# Patient Record
Sex: Female | Born: 1990 | Race: Black or African American | Hispanic: No | Marital: Single | Smoking: Former smoker
Health system: Southern US, Community
[De-identification: ages and names within clinical notes are randomized; demographics above are authoritative.]

## PROBLEM LIST (undated history)

## (undated) DIAGNOSIS — K8020 Calculus of gallbladder without cholecystitis without obstruction: Secondary | ICD-10-CM

## (undated) DIAGNOSIS — F419 Anxiety disorder, unspecified: Secondary | ICD-10-CM

## (undated) DIAGNOSIS — E282 Polycystic ovarian syndrome: Secondary | ICD-10-CM

## (undated) DIAGNOSIS — R102 Pelvic and perineal pain unspecified side: Secondary | ICD-10-CM

## (undated) DIAGNOSIS — N946 Dysmenorrhea, unspecified: Secondary | ICD-10-CM

## (undated) DIAGNOSIS — G8929 Other chronic pain: Secondary | ICD-10-CM

## (undated) DIAGNOSIS — B019 Varicella without complication: Secondary | ICD-10-CM

## (undated) DIAGNOSIS — R109 Unspecified abdominal pain: Secondary | ICD-10-CM

## (undated) HISTORY — PX: WISDOM TOOTH EXTRACTION: SHX21

## (undated) HISTORY — DX: Polycystic ovarian syndrome: E28.2

## (undated) HISTORY — DX: Varicella without complication: B01.9

## (undated) HISTORY — DX: Anxiety disorder, unspecified: F41.9

---
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
## 2012-07-16 ENCOUNTER — Encounter: Payer: Self-pay | Admitting: *Deleted

## 2012-07-23 ENCOUNTER — Encounter: Payer: Self-pay | Admitting: *Deleted

## 2012-07-24 ENCOUNTER — Encounter: Payer: Self-pay | Admitting: *Deleted

## 2012-07-24 ENCOUNTER — Encounter: Payer: Self-pay | Admitting: Obstetrics and Gynecology

## 2012-07-30 ENCOUNTER — Encounter (HOSPITAL_COMMUNITY): Payer: Self-pay | Admitting: *Deleted

## 2012-07-30 ENCOUNTER — Emergency Department (HOSPITAL_COMMUNITY)
Admission: EM | Admit: 2012-07-30 | Discharge: 2012-07-30 | Disposition: A | Discharge status: Home / Self Care | Payer: Medicaid Other | Attending: Emergency Medicine | Admitting: Emergency Medicine

## 2012-07-30 ENCOUNTER — Other Ambulatory Visit: Payer: Self-pay

## 2012-07-30 ENCOUNTER — Ambulatory Visit (HOSPITAL_COMMUNITY)
Admission: RE | Admit: 2012-07-30 | Discharge: 2012-07-30 | Disposition: A | Discharge status: Home / Self Care | Payer: Self-pay | Source: Ambulatory Visit | Attending: Emergency Medicine | Admitting: Emergency Medicine

## 2012-07-30 DIAGNOSIS — Z8619 Personal history of other infectious and parasitic diseases: Secondary | ICD-10-CM | POA: Insufficient documentation

## 2012-07-30 DIAGNOSIS — Z8742 Personal history of other diseases of the female genital tract: Secondary | ICD-10-CM | POA: Insufficient documentation

## 2012-07-30 DIAGNOSIS — R0789 Other chest pain: Secondary | ICD-10-CM | POA: Insufficient documentation

## 2012-07-30 DIAGNOSIS — R079 Chest pain, unspecified: Secondary | ICD-10-CM | POA: Insufficient documentation

## 2012-07-30 DIAGNOSIS — F17200 Nicotine dependence, unspecified, uncomplicated: Secondary | ICD-10-CM | POA: Insufficient documentation

## 2012-07-30 LAB — BASIC METABOLIC PANEL
Calcium: 9.5 mg/dL (ref 8.4–10.5)
Chloride: 102 mEq/L (ref 96–112)
Creatinine, Ser: 0.94 mg/dL (ref 0.50–1.10)
GFR calc Af Amer: >90 mL/min (ref >90)
Sodium: 138 mEq/L (ref 135–145)

## 2012-07-30 LAB — CBC WITH DIFFERENTIAL/PLATELET
Basophils Absolute: 0 10*3/uL (ref 0.0–0.1)
Basophils Relative: 0 % (ref 0–1)
Eosinophils Relative: 1 % (ref 0–5)
HCT: 36.1 % (ref 36.0–46.0)
Lymphocytes Relative: 14 % (ref 12–46)
MCHC: 34.1 g/dL (ref 30.0–36.0)
Monocytes Absolute: 0.9 10*3/uL (ref 0.1–1.0)
Neutro Abs: 13.7 10*3/uL — ABNORMAL HIGH (ref 1.7–7.7)
Platelets: 258 10*3/uL (ref 150–400)
RDW: 13 % (ref 11.5–15.5)
WBC: 17.1 10*3/uL — ABNORMAL HIGH (ref 4.0–10.5)

## 2012-07-30 LAB — TROPONIN I: Troponin I: <0.3 ng/mL (ref <0.30)

## 2012-07-30 NOTE — ED Notes (Signed)
Pt states she woke up having sharp centralized chest pain. Pt states that since she's been at the hospital, she feels better.

## 2012-12-24 ENCOUNTER — Encounter: Payer: Self-pay | Admitting: Obstetrics and Gynecology

## 2012-12-26 ENCOUNTER — Other Ambulatory Visit (HOSPITAL_COMMUNITY)
Admission: RE | Admit: 2012-12-26 | Discharge: 2012-12-26 | Disposition: A | Discharge status: Home / Self Care | Payer: Medicaid Other | Source: Ambulatory Visit | Attending: Obstetrics and Gynecology | Admitting: Obstetrics and Gynecology

## 2012-12-26 ENCOUNTER — Ambulatory Visit (INDEPENDENT_AMBULATORY_CARE_PROVIDER_SITE_OTHER): Payer: Medicaid Other | Admitting: Obstetrics and Gynecology

## 2012-12-26 ENCOUNTER — Encounter (INDEPENDENT_AMBULATORY_CARE_PROVIDER_SITE_OTHER): Payer: Self-pay

## 2012-12-26 ENCOUNTER — Encounter: Payer: Self-pay | Admitting: Obstetrics and Gynecology

## 2012-12-26 VITALS — BP 130/80 | Ht 69.0 in | Wt 321.0 lb

## 2012-12-26 DIAGNOSIS — Z01419 Encounter for gynecological examination (general) (routine) without abnormal findings: Secondary | ICD-10-CM | POA: Insufficient documentation

## 2012-12-26 DIAGNOSIS — Z3049 Encounter for surveillance of other contraceptives: Secondary | ICD-10-CM

## 2012-12-26 DIAGNOSIS — Z3200 Encounter for pregnancy test, result unknown: Secondary | ICD-10-CM

## 2012-12-26 DIAGNOSIS — E282 Polycystic ovarian syndrome: Secondary | ICD-10-CM

## 2012-12-26 DIAGNOSIS — N938 Other specified abnormal uterine and vaginal bleeding: Secondary | ICD-10-CM

## 2012-12-26 DIAGNOSIS — Z113 Encounter for screening for infections with a predominantly sexual mode of transmission: Secondary | ICD-10-CM | POA: Insufficient documentation

## 2012-12-26 DIAGNOSIS — Z1151 Encounter for screening for human papillomavirus (HPV): Secondary | ICD-10-CM | POA: Insufficient documentation

## 2012-12-26 LAB — POCT URINE PREGNANCY: Preg Test, Ur: NEGATIVE

## 2012-12-26 MED ORDER — NORGESTIMATE-ETH ESTRADIOL 0.25-35 MG-MCG PO TABS: 1.0000 | ORAL_TABLET | Freq: Every day | ORAL | Status: DC

## 2012-12-26 MED ORDER — MEDROXYPROGESTERONE ACETATE 10 MG PO TABS: 10.0000 mg | ORAL_TABLET | Freq: Every day | ORAL | Status: DC

## 2012-12-26 NOTE — Progress Notes (Signed)
Patient ID: Katie Roberson, female   DOB: Dec 20, 1990, 22 y.o.   MRN: [id number] Pt here today for annual pap and physical. Pt requested a UPT.   Assessment:  Annual Gyn Exam today for PAP and physical. Prior exams, first PAPs    Plan:  1. pap smear done, next pap due 1 year 2. return annually or prn 3    2 mo f/u 4   Start Provera now x 10d, begin Birth Control in 2 weeks, f/u in 2 months Subjective:  Katie Roberson is a 22 y.o. female No obstetric history on file. who presents for annual exam. No LMP recorded. The patient has complaints today of persistent vaginal bleeding for the past few months. Has prior diagnosis of PCOS at [facility] 2 years prior. Has been following up with PCP for the symptoms. No prior blood work done. No birth control currently. Currently on Celexa for past month, prescribed by therapist. Denies any prior sexual abuse. Denies sexual activity currently. Prior sexual activity in the past.    Unknown family h/o POCS or other GYN abnormalities.   The following portions of the patient's history were reviewed and updated as appropriate: allergies, current medications, past family history, past medical history, past social history, past surgical history and problem list.  Review of Systems Constitutional: negative Gastrointestinal: positive for abdominal pain, intermittent suprapubic pains Genitourinary: abnormal vaginal bleeding  Objective:  Ht 5\' 9"  (1.753 m)  Wt 321 lb (145.605 kg)  BMI 47.38 kg/m2   BMI: Body mass index is 47.38 kg/(m^2).  General Appearance: Alert, appropriate appearance for age. No acute distress HEENT: Grossly normal Neck / Thyroid:  Cardiovascular: RRR; normal S1, S2, no murmur Lungs: CTA bilaterally Back: No CVAT Breast Exam: No dimpling, nipple retraction or discharge. No masses or nodes., Normal to inspection and Normal breast tissue bilaterally Gastrointestinal: Soft, non-tender, no masses or  organomegaly Pelvic Exam: External genitalia: normal general appearance Vaginal: presence of blood Cervix: normal appearance and clear mucus and lite blood Adnexa: normal bimanual exam Uterus: normal single, nontender Clinical staff offered to be present for exam: yes  Initials: Ashley  Lymphatic Exam: Non-palpable nodes in neck, clavicular, axillary, or inguinal regions Skin: no rash or abnormalities Neurologic: Normal gait and speech, no tremor  Psychiatric: Alert and oriented, appropriate affect.  Urinalysis:Not done Urine preg test negative   Christin Bach. MD Pgr [phone] 10:52 AM

## 2012-12-26 NOTE — Patient Instructions (Signed)
Take provera daily x 10 days, expect heavy bleeding upon completion of provera  On the following Sunday, begin the sprintec Birth control pills   Keep a calendar with all days of bleeding on the calendar, so we can review at followup  Pap results by letter in 1 month.

## 2013-01-05 ENCOUNTER — Telehealth: Payer: Self-pay | Admitting: Obstetrics and Gynecology

## 2013-01-05 NOTE — Telephone Encounter (Addendum)
Pt informed of abnormal pap, ASCUS, repeat in 1 year, GC/CHL and HPV not detected.

## 2013-01-09 ENCOUNTER — Encounter: Payer: Self-pay | Admitting: Obstetrics and Gynecology

## 2013-01-20 ENCOUNTER — Telehealth: Payer: Self-pay | Admitting: *Deleted

## 2013-01-20 NOTE — Telephone Encounter (Signed)
Pt informed abnormal pap (ASCUS), pamphlet mailed per pt request. Pt to repeat pap in 1 year.

## 2013-01-28 ENCOUNTER — Telehealth: Payer: Self-pay | Admitting: Obstetrics and Gynecology

## 2013-01-28 NOTE — Telephone Encounter (Signed)
Pt very concerned about her bleeding, Pt states that she has had heavy bleeding for about 8 months. Pt was given provera, is having heavy bleeding right now. Pt saw Dr. Emelda Fear. Pt is unsure of why she was given Provera. Pt has never stopped bleeding. Pt was advised to make an appointment with a provider to discuss heavy bleeding.

## 2013-01-29 ENCOUNTER — Encounter: Payer: Self-pay | Admitting: Obstetrics & Gynecology

## 2013-01-29 ENCOUNTER — Ambulatory Visit (INDEPENDENT_AMBULATORY_CARE_PROVIDER_SITE_OTHER): Payer: Medicaid Other | Admitting: Obstetrics & Gynecology

## 2013-01-29 ENCOUNTER — Encounter (INDEPENDENT_AMBULATORY_CARE_PROVIDER_SITE_OTHER): Payer: Self-pay

## 2013-01-29 VITALS — BP 140/80 | Ht 69.0 in | Wt 318.0 lb

## 2013-01-29 DIAGNOSIS — N939 Abnormal uterine and vaginal bleeding, unspecified: Secondary | ICD-10-CM

## 2013-01-29 DIAGNOSIS — Z3049 Encounter for surveillance of other contraceptives: Secondary | ICD-10-CM

## 2013-01-29 HISTORY — DX: Abnormal uterine and vaginal bleeding, unspecified: N93.9

## 2013-01-29 MED ORDER — MEGESTROL ACETATE 40 MG PO TABS: ORAL_TABLET | ORAL | Status: DC

## 2013-01-29 NOTE — Progress Notes (Signed)
Patient ID: Katie Roberson, female   DOB: 03/23/1990, 22 y.o.   MRN: [id number] Pt has long history, essentially since menarche, with irregular heavy and prolonged bleeding Has been bleeding almost continuously for almost 8 months  Will use megestrol algorithm  Follow up in 6 months

## 2013-01-30 ENCOUNTER — Ambulatory Visit: Payer: Medicaid Other | Admitting: Adult Health

## 2013-02-18 ENCOUNTER — Telehealth: Payer: Self-pay

## 2013-03-02 NOTE — Telephone Encounter (Signed)
Pt states questions answered.

## 2013-03-06 ENCOUNTER — Ambulatory Visit: Payer: Medicaid Other | Admitting: Obstetrics and Gynecology

## 2013-03-12 ENCOUNTER — Ambulatory Visit: Payer: Medicaid Other | Admitting: Obstetrics & Gynecology

## 2013-03-12 ENCOUNTER — Telehealth: Payer: Self-pay | Admitting: Obstetrics & Gynecology

## 2013-03-12 NOTE — Telephone Encounter (Signed)
Pt aware of Hgb from December. Pt was advised to start OTC iron supplement. Pt verbalized understanding.

## 2013-03-26 ENCOUNTER — Ambulatory Visit: Payer: Medicaid Other | Admitting: Obstetrics & Gynecology

## 2013-04-28 ENCOUNTER — Telehealth: Payer: Self-pay | Admitting: Obstetrics & Gynecology

## 2013-04-28 NOTE — Telephone Encounter (Signed)
Pt requesting pap results from 12/26/12. Pt informed of abnormal pap (ASCUS) will f/u 1 year pap per Dr. Emelda FearFerguson, pt also informed of WNL TSH.

## 2013-06-29 ENCOUNTER — Telehealth: Payer: Self-pay | Admitting: Obstetrics and Gynecology

## 2013-06-29 NOTE — Telephone Encounter (Signed)
Pt states that she has been looking online about baby powder and that it said that the talcum or corn starch could cause ovarian cancer. I spoke with JAG and she advised that she had not heard that those would cause ovarian cancer, but that she usually does not advise using baby powder down there because it clumps.

## 2013-07-28 ENCOUNTER — Encounter: Payer: Self-pay | Admitting: *Deleted

## 2013-08-11 ENCOUNTER — Encounter (HOSPITAL_COMMUNITY): Payer: Self-pay | Admitting: Emergency Medicine

## 2013-08-11 ENCOUNTER — Emergency Department (HOSPITAL_COMMUNITY)
Admission: EM | Admit: 2013-08-11 | Discharge: 2013-08-11 | Disposition: A | Discharge status: Home / Self Care | Payer: Medicaid Other | Attending: Emergency Medicine | Admitting: Emergency Medicine

## 2013-08-11 DIAGNOSIS — K2970 Gastritis, unspecified, without bleeding: Secondary | ICD-10-CM | POA: Insufficient documentation

## 2013-08-11 DIAGNOSIS — K2990 Gastroduodenitis, unspecified, without bleeding: Principal | ICD-10-CM

## 2013-08-11 DIAGNOSIS — F411 Generalized anxiety disorder: Secondary | ICD-10-CM | POA: Insufficient documentation

## 2013-08-11 DIAGNOSIS — Z8619 Personal history of other infectious and parasitic diseases: Secondary | ICD-10-CM | POA: Insufficient documentation

## 2013-08-11 DIAGNOSIS — Z79899 Other long term (current) drug therapy: Secondary | ICD-10-CM | POA: Insufficient documentation

## 2013-08-11 DIAGNOSIS — Z8639 Personal history of other endocrine, nutritional and metabolic disease: Secondary | ICD-10-CM | POA: Insufficient documentation

## 2013-08-11 DIAGNOSIS — Z3202 Encounter for pregnancy test, result negative: Secondary | ICD-10-CM | POA: Insufficient documentation

## 2013-08-11 DIAGNOSIS — Z862 Personal history of diseases of the blood and blood-forming organs and certain disorders involving the immune mechanism: Secondary | ICD-10-CM | POA: Insufficient documentation

## 2013-08-11 LAB — COMPREHENSIVE METABOLIC PANEL
ALBUMIN: 3.1 g/dL — AB (ref 3.5–5.2)
ALT: 14 U/L (ref 0–35)
AST: 21 U/L (ref 0–37)
Alkaline Phosphatase: 124 U/L — ABNORMAL HIGH (ref 39–117)
BILIRUBIN TOTAL: 0.1 mg/dL — AB (ref 0.3–1.2)
BUN: 11 mg/dL (ref 6–23)
CALCIUM: 8.8 mg/dL (ref 8.4–10.5)
CHLORIDE: 102 meq/L (ref 96–112)
CO2: 26 meq/L (ref 19–32)
Creatinine, Ser: 0.94 mg/dL (ref 0.50–1.10)
GFR calc Af Amer: >90 mL/min (ref >90)
GFR, EST NON AFRICAN AMERICAN: 85 mL/min — AB (ref >90)
Glucose, Bld: 100 mg/dL — ABNORMAL HIGH (ref 70–99)
Potassium: 3.7 mEq/L (ref 3.7–5.3)
SODIUM: 138 meq/L (ref 137–147)
Total Protein: 7.3 g/dL (ref 6.0–8.3)

## 2013-08-11 LAB — CBC WITH DIFFERENTIAL/PLATELET
BASOS ABS: 0 10*3/uL (ref 0.0–0.1)
BASOS PCT: 0 % (ref 0–1)
Eosinophils Absolute: 0.1 10*3/uL (ref 0.0–0.7)
Eosinophils Relative: 1 % (ref 0–5)
HCT: 34.3 % — ABNORMAL LOW (ref 36.0–46.0)
Hemoglobin: 11.8 g/dL — ABNORMAL LOW (ref 12.0–15.0)
LYMPHS PCT: 26 % (ref 12–46)
Lymphs Abs: 2.5 10*3/uL (ref 0.7–4.0)
MCH: 28.6 pg (ref 26.0–34.0)
MCHC: 34.4 g/dL (ref 30.0–36.0)
MCV: 83.1 fL (ref 78.0–100.0)
Monocytes Absolute: 0.7 10*3/uL (ref 0.1–1.0)
Monocytes Relative: 7 % (ref 3–12)
NEUTROS ABS: 6.6 10*3/uL (ref 1.7–7.7)
NEUTROS PCT: 66 % (ref 43–77)
PLATELETS: 257 10*3/uL (ref 150–400)
RBC: 4.13 MIL/uL (ref 3.87–5.11)
RDW: 13.7 % (ref 11.5–15.5)
WBC: 9.9 10*3/uL (ref 4.0–10.5)

## 2013-08-11 LAB — URINALYSIS, ROUTINE W REFLEX MICROSCOPIC
Bilirubin Urine: NEGATIVE
Glucose, UA: NEGATIVE mg/dL
HGB URINE DIPSTICK: NEGATIVE
Ketones, ur: NEGATIVE mg/dL
LEUKOCYTES UA: NEGATIVE
NITRITE: NEGATIVE
Protein, ur: NEGATIVE mg/dL
UROBILINOGEN UA: 0.2 mg/dL (ref 0.0–1.0)
pH: 6 (ref 5.0–8.0)

## 2013-08-11 LAB — PREGNANCY, URINE: PREG TEST UR: NEGATIVE

## 2013-08-11 LAB — LIPASE, BLOOD: Lipase: 42 U/L (ref 11–59)

## 2013-08-11 MED ORDER — GI COCKTAIL ~~LOC~~
30.0000 mL | Freq: Once | ORAL | Status: AC
  Administered 2013-08-11: 30 mL via ORAL
  Filled 2013-08-11: qty 30

## 2013-08-11 MED ORDER — ONDANSETRON 4 MG PO TBDP: ORAL_TABLET | ORAL | Status: DC

## 2013-08-11 NOTE — Discharge Instructions (Signed)

## 2013-08-11 NOTE — ED Provider Notes (Signed)
CSN: [id number]     Arrival date & time 08/11/13  0240 History   First MD Initiated Contact with Patient 08/11/13 0248     Chief Complaint  Patient presents with  . Abdominal Pain     (Consider location/radiation/quality/duration/timing/severity/associated sxs/prior Treatment) HPI Patient presents with several months of episodic upper abdominal pain. She was recently seen on Friday had a ultrasound which she states showed the possibility of gallstones. She began having abdominal pain yesterday and has been constant this evening. She had one episode of vomiting. She's had chills but no fever. The pain is improved slightly. Patient denies taking NSAIDs or eating acidic/spicy food. The pain does not radiate. Does not appear to be associated with food. Past Medical History  Diagnosis Date  . PCOS (polycystic ovarian syndrome)   . Chicken pox   . Anxiety    History reviewed. No pertinent past surgical history. Family History  Problem Relation Age of Onset  . Diabetes Paternal Grandmother    History  Substance Use Topics  . Smoking status: Never Smoker   . Smokeless tobacco: Never Used  . Alcohol Use: No   OB History   Grav Para Term Preterm Abortions TAB SAB Ect Mult Living                 Review of Systems  Constitutional: Negative for fever and chills.  Respiratory: Negative for cough and shortness of breath.   Cardiovascular: Negative for chest pain.  Gastrointestinal: Positive for nausea, vomiting and abdominal pain. Negative for diarrhea and constipation.  Genitourinary: Negative for dysuria and flank pain.  Musculoskeletal: Negative for back pain, myalgias, neck pain and neck stiffness.  Skin: Negative for rash and wound.  Neurological: Negative for dizziness, weakness, light-headedness and numbness.  All other systems reviewed and are negative.     Allergies  Review of patient's allergies indicates no known allergies.  Home Medications   Prior to Admission  medications   Medication Sig Start Date End Date Taking? Authorizing Zoey Gilkeson  clonazePAM (KLONOPIN) 0.5 MG tablet Take 0.5 mg by mouth 2 (two) times daily as needed for anxiety.   Yes Historical Baudelia Schroepfer, MD  omeprazole (PRILOSEC) 20 MG capsule Take 20 mg by mouth daily.   Yes Historical Clodagh Odenthal, MD  citalopram (CELEXA) 10 MG tablet Take 10 mg by mouth daily.    Historical Khai Torbert, MD  medroxyPROGESTERone (PROVERA) 10 MG tablet Take 1 tablet (10 mg total) by mouth daily. 12/26/12   Tilda BurrowJohn Ferguson V, MD  megestrol (MEGACE) 40 MG tablet Take 3 tablets a day for 5 days, 2 tablets a day for 5 days, then 1 tablet a day 01/29/13   Lazaro ArmsLuther H Eure, MD  norgestimate-ethinyl estradiol (ORTHO-CYCLEN,SPRINTEC,PREVIFEM) 0.25-35 MG-MCG tablet Take 1 tablet by mouth daily. 12/26/12   Tilda BurrowJohn Ferguson V, MD   BP 137/91  Pulse 78  Temp(Src) 98.1 F (36.7 C) (Oral)  Resp 20  SpO2 100% Physical Exam  Nursing note and vitals reviewed. Constitutional: She is oriented to person, place, and time. She appears well-developed and well-nourished. No distress.  Patient appears very comfortable.  HENT:  Head: Normocephalic and atraumatic.  Mouth/Throat: Oropharynx is clear and moist.  Eyes: EOM are normal. Pupils are equal, round, and reactive to light.  Neck: Normal range of motion. Neck supple.  Cardiovascular: Normal rate and regular rhythm.   Pulmonary/Chest: Effort normal and breath sounds normal. No respiratory distress. She has no wheezes. She has no rales.  Abdominal: Soft. Bowel sounds are normal. She  exhibits no distension and no mass. There is tenderness (mild epigastric tenderness with palpation.). There is no rebound and no guarding.  Musculoskeletal: Normal range of motion. She exhibits no edema and no tenderness.  No CVA tenderness.  Neurological: She is alert and oriented to person, place, and time.  Skin: Skin is warm and dry. No rash noted. No erythema.  Psychiatric: She has a normal mood and affect.  Her behavior is normal.    ED Course  Procedures (including critical care time) Labs Review Labs Reviewed  URINALYSIS, ROUTINE W REFLEX MICROSCOPIC  PREGNANCY, URINE  CBC WITH DIFFERENTIAL  COMPREHENSIVE METABOLIC PANEL  LIPASE, BLOOD    Imaging Review No results found.   EKG Interpretation None      MDM   Final diagnoses:  None   Symptoms are now improved with the GI cocktail. Abdomen soft without any rebound or guarding. Symptoms likely due to gastritis. Patient is to continue on file sec and take over-the-counter Mylanta or Maalox when necessary. Have given by some modification advice and recommended followup with gastroenterology should her symptoms continue. For worsening symptoms she's been advised to return to the emergency department.     Loren Raceravid Yelverton, MD 08/11/13 [phone]

## 2013-08-11 NOTE — ED Notes (Signed)
Pt with c/o abd pain, states she had gallbladder ultrasound on Friday, states she was told she may have gallstones.

## 2013-08-17 DIAGNOSIS — Z029 Encounter for administrative examinations, unspecified: Secondary | ICD-10-CM

## 2013-08-26 ENCOUNTER — Encounter (HOSPITAL_COMMUNITY): Admission: RE | Admit: 2013-08-26 | Payer: Medicaid Other | Source: Ambulatory Visit

## 2013-08-31 ENCOUNTER — Ambulatory Visit (HOSPITAL_COMMUNITY): Admission: RE | Admit: 2013-08-31 | Payer: Medicaid Other | Source: Ambulatory Visit | Admitting: General Surgery

## 2013-08-31 ENCOUNTER — Encounter (HOSPITAL_COMMUNITY): Admission: RE | Payer: Self-pay | Source: Ambulatory Visit

## 2013-08-31 SURGERY — LAPAROSCOPIC CHOLECYSTECTOMY
Anesthesia: General

## 2013-09-02 ENCOUNTER — Ambulatory Visit: Payer: Medicaid Other | Admitting: Gastroenterology

## 2013-09-02 ENCOUNTER — Telehealth: Payer: Self-pay | Admitting: Gastroenterology

## 2013-09-02 ENCOUNTER — Encounter: Payer: Self-pay | Admitting: Gastroenterology

## 2013-09-02 NOTE — Telephone Encounter (Signed)
Mailed letter °

## 2013-09-02 NOTE — Telephone Encounter (Signed)
Pt was a no show

## 2013-10-03 ENCOUNTER — Encounter (HOSPITAL_COMMUNITY): Payer: Self-pay | Admitting: Emergency Medicine

## 2013-10-03 ENCOUNTER — Emergency Department (HOSPITAL_COMMUNITY)
Admission: EM | Admit: 2013-10-03 | Discharge: 2013-10-04 | Disposition: A | Discharge status: Home / Self Care | Payer: Medicaid Other | Attending: Emergency Medicine | Admitting: Emergency Medicine

## 2013-10-03 DIAGNOSIS — M549 Dorsalgia, unspecified: Secondary | ICD-10-CM | POA: Insufficient documentation

## 2013-10-03 DIAGNOSIS — Z8659 Personal history of other mental and behavioral disorders: Secondary | ICD-10-CM | POA: Insufficient documentation

## 2013-10-03 DIAGNOSIS — Z792 Long term (current) use of antibiotics: Secondary | ICD-10-CM | POA: Insufficient documentation

## 2013-10-03 DIAGNOSIS — Z8619 Personal history of other infectious and parasitic diseases: Secondary | ICD-10-CM | POA: Insufficient documentation

## 2013-10-03 DIAGNOSIS — Z862 Personal history of diseases of the blood and blood-forming organs and certain disorders involving the immune mechanism: Secondary | ICD-10-CM | POA: Insufficient documentation

## 2013-10-03 DIAGNOSIS — R1013 Epigastric pain: Secondary | ICD-10-CM | POA: Insufficient documentation

## 2013-10-03 DIAGNOSIS — K8050 Calculus of bile duct without cholangitis or cholecystitis without obstruction: Secondary | ICD-10-CM

## 2013-10-03 DIAGNOSIS — K5900 Constipation, unspecified: Secondary | ICD-10-CM | POA: Insufficient documentation

## 2013-10-03 DIAGNOSIS — Z3202 Encounter for pregnancy test, result negative: Secondary | ICD-10-CM | POA: Insufficient documentation

## 2013-10-03 DIAGNOSIS — K8020 Calculus of gallbladder without cholecystitis without obstruction: Secondary | ICD-10-CM | POA: Insufficient documentation

## 2013-10-03 DIAGNOSIS — Z8639 Personal history of other endocrine, nutritional and metabolic disease: Secondary | ICD-10-CM | POA: Insufficient documentation

## 2013-10-03 LAB — POC URINE PREG, ED: PREG TEST UR: NEGATIVE

## 2013-10-03 MED ORDER — HYDROMORPHONE HCL PF 1 MG/ML IJ SOLN
1.0000 mg | Freq: Once | INTRAMUSCULAR | Status: AC
  Administered 2013-10-03: 1 mg via INTRAVENOUS
  Filled 2013-10-03: qty 1

## 2013-10-03 MED ORDER — SODIUM CHLORIDE 0.9 % IV SOLN: 1000.0000 mL | INTRAVENOUS | Status: DC

## 2013-10-03 MED ORDER — SODIUM CHLORIDE 0.9 % IV SOLN
1000.0000 mL | Freq: Once | INTRAVENOUS | Status: AC
  Administered 2013-10-03: 1000 mL via INTRAVENOUS

## 2013-10-03 MED ORDER — ONDANSETRON HCL 4 MG/2ML IJ SOLN
4.0000 mg | Freq: Once | INTRAMUSCULAR | Status: AC
  Administered 2013-10-03: 4 mg via INTRAVENOUS
  Filled 2013-10-03: qty 2

## 2013-10-03 NOTE — ED Notes (Signed)
Upper abdominal pain, with n&V,  emesis last time 1hr ago.  States she had this issue before 2 years ago with gall stones.

## 2013-10-03 NOTE — ED Provider Notes (Signed)
CSN: [id number]     Arrival date & time 10/03/13  2207 History  This chart was scribed for Dione Boozeavid Dontea Corlew, MD by Modena JanskyAlbert Thayil, ED Scribe. This patient was seen in room APA06/[facility]06 and the patient's care was started at 11:16 PM.    Chief Complaint  Patient presents with  . Abdominal Pain   The history is provided by the patient. No language interpreter was used.   HPI Comments: Katie Roberson is a 23 y.o. female with a hx of gall stones who presents to the Emergency Department complaining of constant moderate epigastric pain that started this morning. She states that the pain radiates that her upper back. She describes her pain as a sharp sensation. She reports associated nausea, abdominal swelling, constipation, and emesis. She reports one episode of emesis that provided no relief. She states that there are no known modifying factors. She states that the last thing she ate was pancakes, sausage, and bacon. She reports that her pain as been present for 3 years due to gall stones. She denies any diarrhea.   PCP- Weston BrassNick Past Medical History  Diagnosis Date  . PCOS (polycystic ovarian syndrome)   . Chicken pox   . Anxiety    History reviewed. No pertinent past surgical history. Family History  Problem Relation Age of Onset  . Diabetes Paternal Grandmother    History  Substance Use Topics  . Smoking status: Never Smoker   . Smokeless tobacco: Never Used  . Alcohol Use: No   OB History   Grav Para Term Preterm Abortions TAB SAB Ect Mult Living                 Review of Systems  Gastrointestinal: Positive for vomiting, abdominal pain and constipation. Negative for diarrhea.  Musculoskeletal: Positive for back pain.  All other systems reviewed and are negative.   Allergies  Review of patient's allergies indicates no known allergies.  Home Medications   Prior to Admission medications   Medication Sig Start Date End Date Taking? Authorizing Provider  metroNIDAZOLE (FLAGYL) 500  MG tablet Take 500 mg by mouth 2 (two) times daily. For 7 days(10/01/13)   Yes Historical Provider, MD   BP 121/81  Pulse 80  Temp(Src) 98.2 F (36.8 C) (Oral)  Resp 20  Ht 5\' 9"  (1.753 m)  Wt 322 lb 8 oz (146.285 kg)  BMI 47.60 kg/m2  LMP 10/03/2013 Physical Exam  Nursing note and vitals reviewed. Constitutional: She is oriented to person, place, and time. She appears well-developed and well-nourished. No distress.  HENT:  Head: Normocephalic and atraumatic.  Eyes: No scleral icterus.  Neck: Neck supple. No tracheal deviation present.  Cardiovascular: Normal rate.   Pulmonary/Chest: Effort normal. No respiratory distress.  Abdominal: There is tenderness.  Moderate TTP in epigastric and RUQ. Positive Murphy's sign. Decreased bowel sounds.   Musculoskeletal: Normal range of motion.  Neurological: She is alert and oriented to person, place, and time.  Skin: Skin is warm and dry.  Psychiatric: She has a normal mood and affect. Her behavior is normal.    ED Course  Procedures (including critical care time)  COORDINATION OF CARE: 11:20 PM- Pt advised of plan for treatment which includes medication and labs and pt agrees.  DIAGNOSTIC STUDIES: Labs Review Results for orders placed during the hospital encounter of 10/03/13  CBC WITH DIFFERENTIAL      Result Value Ref Range   WBC 11.9 (*) 4.0 - 10.5 K/uL   RBC 4.27  3.87 -  5.11 MIL/uL   Hemoglobin 12.2  12.0 - 15.0 g/dL   HCT 86.7  67.2 - 09.4 %   MCV 84.5  78.0 - 100.0 fL   MCH 28.6  26.0 - 34.0 pg   MCHC 33.8  30.0 - 36.0 g/dL   RDW 70.9  62.8 - 36.6 %   Platelets 261  150 - 400 K/uL   Neutrophils Relative % 66  43 - 77 %   Neutro Abs 7.7  1.7 - 7.7 K/uL   Lymphocytes Relative 27  12 - 46 %   Lymphs Abs 3.3  0.7 - 4.0 K/uL   Monocytes Relative 6  3 - 12 %   Monocytes Absolute 0.8  0.1 - 1.0 K/uL   Eosinophils Relative 1  0 - 5 %   Eosinophils Absolute 0.1  0.0 - 0.7 K/uL   Basophils Relative 0  0 - 1 %   Basophils  Absolute 0.0  0.0 - 0.1 K/uL  COMPREHENSIVE METABOLIC PANEL      Result Value Ref Range   Sodium 137  137 - 147 mEq/L   Potassium 3.6 (*) 3.7 - 5.3 mEq/L   Chloride 102  96 - 112 mEq/L   CO2 25  19 - 32 mEq/L   Glucose, Bld 94  70 - 99 mg/dL   BUN 9  6 - 23 mg/dL   Creatinine, Ser 2.94  0.50 - 1.10 mg/dL   Calcium 9.0  8.4 - 76.5 mg/dL   Total Protein 7.6  6.0 - 8.3 g/dL   Albumin 3.6  3.5 - 5.2 g/dL   AST 21  0 - 37 U/L   ALT 13  0 - 35 U/L   Alkaline Phosphatase 130 (*) 39 - 117 U/L   Total Bilirubin 0.3  0.3 - 1.2 mg/dL   GFR calc non Af Amer 84 (*) >90 mL/min   GFR calc Af Amer >90  >90 mL/min   Anion gap 10  5 - 15  LIPASE, BLOOD      Result Value Ref Range   Lipase 41  11 - 59 U/L  POC URINE PREG, ED      Result Value Ref Range   Preg Test, Ur NEGATIVE  NEGATIVE   MDM   Final diagnoses:  Biliary colic    Abdominal pain consistent with biliary colic. Old records have been reviewed and she has a prior ED visit for biliary colic although the ultrasound was not done in our system. She was given IV hydration, and is given hydromorphone for pain and ondansetron for nausea with excellent relief of symptoms. Laboratory workup was significant for mildly elevated alkaline phosphatase not significantly changed from baseline. There is no indication for emergent surgery. She is discharged with prescriptions for metoclopramide for nausea and oxycodone-acetaminophen for pain and is referred back to her surgeon at [facility] in [place]. She is advised to stay on a low-fat diet.  I personally performed the services described in this documentation, which was scribed in my presence. The recorded information has been reviewed and is accurate.      Dione Booze, MD 10/04/13 [phone]

## 2013-10-03 NOTE — ED Notes (Signed)
Pt reports having known gall stones and [facility]. States the pain began to increase x 2 days ago with nausea and emesis starting today. Nad noted at present.

## 2013-10-04 LAB — COMPREHENSIVE METABOLIC PANEL
ALT: 13 U/L (ref 0–35)
AST: 21 U/L (ref 0–37)
Albumin: 3.6 g/dL (ref 3.5–5.2)
Alkaline Phosphatase: 130 U/L — ABNORMAL HIGH (ref 39–117)
Anion gap: 10 (ref 5–15)
BUN: 9 mg/dL (ref 6–23)
CO2: 25 meq/L (ref 19–32)
CREATININE: 0.95 mg/dL (ref 0.50–1.10)
Calcium: 9 mg/dL (ref 8.4–10.5)
Chloride: 102 mEq/L (ref 96–112)
GFR, EST NON AFRICAN AMERICAN: 84 mL/min — AB (ref >90)
Glucose, Bld: 94 mg/dL (ref 70–99)
Potassium: 3.6 mEq/L — ABNORMAL LOW (ref 3.7–5.3)
Sodium: 137 mEq/L (ref 137–147)
TOTAL PROTEIN: 7.6 g/dL (ref 6.0–8.3)
Total Bilirubin: 0.3 mg/dL (ref 0.3–1.2)

## 2013-10-04 LAB — CBC WITH DIFFERENTIAL/PLATELET
Basophils Absolute: 0 10*3/uL (ref 0.0–0.1)
Basophils Relative: 0 % (ref 0–1)
Eosinophils Absolute: 0.1 10*3/uL (ref 0.0–0.7)
Eosinophils Relative: 1 % (ref 0–5)
HEMATOCRIT: 36.1 % (ref 36.0–46.0)
Hemoglobin: 12.2 g/dL (ref 12.0–15.0)
LYMPHS PCT: 27 % (ref 12–46)
Lymphs Abs: 3.3 10*3/uL (ref 0.7–4.0)
MCH: 28.6 pg (ref 26.0–34.0)
MCHC: 33.8 g/dL (ref 30.0–36.0)
MCV: 84.5 fL (ref 78.0–100.0)
MONO ABS: 0.8 10*3/uL (ref 0.1–1.0)
Monocytes Relative: 6 % (ref 3–12)
Neutro Abs: 7.7 10*3/uL (ref 1.7–7.7)
Neutrophils Relative %: 66 % (ref 43–77)
PLATELETS: 261 10*3/uL (ref 150–400)
RBC: 4.27 MIL/uL (ref 3.87–5.11)
RDW: 13.9 % (ref 11.5–15.5)
WBC: 11.9 10*3/uL — AB (ref 4.0–10.5)

## 2013-10-04 LAB — LIPASE, BLOOD: LIPASE: 41 U/L (ref 11–59)

## 2013-10-04 MED ORDER — OXYCODONE-ACETAMINOPHEN 5-325 MG PO TABS: 1.0000 | ORAL_TABLET | ORAL | Status: DC | PRN

## 2013-10-04 MED ORDER — METOCLOPRAMIDE HCL 10 MG PO TABS: 10.0000 mg | ORAL_TABLET | Freq: Four times a day (QID) | ORAL | Status: DC | PRN

## 2013-10-04 NOTE — Discharge Instructions (Signed)
Stay on a low fat diet. ° ° °Biliary Colic  °Biliary colic is a steady or irregular pain in the upper abdomen. It is usually under the right side of the rib cage. It happens when gallstones interfere with the normal flow of bile from the gallbladder. Bile is a liquid that helps to digest fats. Bile is made in the liver and stored in the gallbladder. When you eat a meal, bile passes from the gallbladder through the cystic duct and the common bile duct into the small intestine. There, it mixes with partially digested food. If a gallstone blocks either of these ducts, the normal flow of bile is blocked. The muscle cells in the bile duct contract forcefully to try to move the stone. This causes the pain of biliary colic.  °SYMPTOMS  °· A person with biliary colic usually complains of pain in the upper abdomen. This pain can be: °¨ In the center of the upper abdomen just below the breastbone. °¨ In the upper-right part of the abdomen, near the gallbladder and liver. °¨ Spread back toward the right shoulder blade. °· Nausea and vomiting. °· The pain usually occurs after eating. °· Biliary colic is usually triggered by the digestive system's demand for bile. The demand for bile is high after fatty meals. Symptoms can also occur when a person who has been fasting suddenly eats a very large meal. Most episodes of biliary colic pass after 1 to 5 hours. After the most intense pain passes, your abdomen may continue to ache mildly for about 24 hours. °DIAGNOSIS  °After you describe your symptoms, your caregiver will perform a physical exam. He or she will pay attention to the upper right portion of your belly (abdomen). This is the area of your liver and gallbladder. An ultrasound will help your caregiver look for gallstones. Specialized scans of the gallbladder may also be done. Blood tests may be done, especially if you have fever or if your pain persists. °PREVENTION  °Biliary colic can be prevented by controlling the risk  factors for gallstones. Some of these risk factors, such as heredity, increasing age, and pregnancy are a normal part of life. Obesity and a high-fat diet are risk factors you can change through a healthy lifestyle. Women going through menopause who take hormone replacement therapy (estrogen) are also more likely to develop biliary colic. °TREATMENT  °· Pain medication may be prescribed. °· You may be encouraged to eat a fat-free diet. °· If the first episode of biliary colic is severe, or episodes of colic keep retuning, surgery to remove the gallbladder (cholecystectomy) is usually recommended. This procedure can be done through small incisions using an instrument called a laparoscope. The procedure often requires a brief stay in the hospital. Some people can leave the hospital the same day. It is the most widely used treatment in people troubled by painful gallstones. It is effective and safe, with no complications in more than 90% of cases. °· If surgery cannot be done, medication that dissolves gallstones may be used. This medication is expensive and can take months or years to work. Only small stones will dissolve. °· Rarely, medication to dissolve gallstones is combined with a procedure called shock-wave lithotripsy. This procedure uses carefully aimed shock waves to break up gallstones. In many people treated with this procedure, gallstones form again within a few years. °PROGNOSIS  °If gallstones block your cystic duct or common bile duct, you are at risk for repeated episodes of biliary colic. There is   also a 25% chance that you will develop a gallbladder infection(acute cholecystitis), or some other complication of gallstones within 10 to 20 years. If you have surgery, schedule it at a time that is convenient for you and at a time when you are not sick. °HOME CARE INSTRUCTIONS  °· Drink plenty of clear fluids. °· Avoid fatty, greasy or fried foods, or any foods that make your pain worse. °· Take  medications as directed. °SEEK MEDICAL CARE IF:  °· You develop a fever over 100.5° F (38.1° C). °· Your pain gets worse over time. °· You develop nausea that prevents you from eating and drinking. °· You develop vomiting. °SEEK IMMEDIATE MEDICAL CARE IF:  °· You have continuous or severe belly (abdominal) pain which is not relieved with medications. °· You develop nausea and vomiting which is not relieved with medications. °· You have symptoms of biliary colic and you suddenly develop a fever and shaking chills. This may signal cholecystitis. Call your caregiver immediately. °· You develop a yellow color to your skin or the white part of your eyes (jaundice). °Document Released: 07/09/2005 Document Revised: 04/30/2011 Document Reviewed: 09/18/2007 °[employer]® Patient Information ©2015 [employer]. This information is not intended to replace advice given to you by your health care provider. Make sure you discuss any questions you have with your health care provider. ° °Acetaminophen; Oxycodone tablets °What is this medicine? °ACETAMINOPHEN; OXYCODONE (a set a MEE noe fen; ox i KOE done) is a pain reliever. It is used to treat mild to moderate pain. °This medicine may be used for other purposes; ask your health care provider or pharmacist if you have questions. °COMMON BRAND NAME(S): Endocet, Magnacet, Narvox, Percocet, Perloxx, Primalev, Primlev, Roxicet, Xolox °What should I tell my health care provider before I take this medicine? °They need to know if you have any of these conditions: °-brain tumor °-Crohn's disease, inflammatory bowel disease, or ulcerative colitis °-drug abuse or addiction °-head injury °-heart or circulation problems °-if you often drink alcohol °-kidney disease or problems going to the bathroom °-liver disease °-lung disease, asthma, or breathing problems °-an unusual or allergic reaction to acetaminophen, oxycodone, other opioid analgesics, other medicines, foods, dyes, or  preservatives °-pregnant or trying to get pregnant °-breast-feeding °How should I use this medicine? °Take this medicine by mouth with a full glass of water. Follow the directions on the prescription label. Take your medicine at regular intervals. Do not take your medicine more often than directed. °Talk to your pediatrician regarding the use of this medicine in children. Special care may be needed. °Patients over 65 years old may have a stronger reaction and need a smaller dose. °Overdosage: If you think you have taken too much of this medicine contact a poison control center or emergency room at once. °NOTE: This medicine is only for you. Do not share this medicine with others. °What if I miss a dose? °If you miss a dose, take it as soon as you can. If it is almost time for your next dose, take only that dose. Do not take double or extra doses. °What may interact with this medicine? °-alcohol °-antihistamines °-barbiturates like amobarbital, butalbital, butabarbital, methohexital, pentobarbital, phenobarbital, thiopental, and secobarbital °-benztropine °-drugs for bladder problems like solifenacin, trospium, oxybutynin, tolterodine, hyoscyamine, and methscopolamine °-drugs for breathing problems like ipratropium and tiotropium °-drugs for certain stomach or intestine problems like propantheline, homatropine methylbromide, glycopyrrolate, atropine, belladonna, and dicyclomine °-general anesthetics like etomidate, ketamine, nitrous oxide, propofol, desflurane, enflurane, halothane, isoflurane, and   sevoflurane °-medicines for depression, anxiety, or psychotic disturbances °-medicines for sleep °-muscle relaxants °-naltrexone °-narcotic medicines (opiates) for pain °-phenothiazines like perphenazine, thioridazine, chlorpromazine, mesoridazine, fluphenazine, prochlorperazine, promazine, and trifluoperazine °-scopolamine °-tramadol °-trihexyphenidyl °This list may not describe all possible interactions. Give your health  care provider a list of all the medicines, herbs, non-prescription drugs, or dietary supplements you use. Also tell them if you smoke, drink alcohol, or use illegal drugs. Some items may interact with your medicine. °What should I watch for while using this medicine? °Tell your doctor or health care professional if your pain does not go away, if it gets worse, or if you have new or a different type of pain. You may develop tolerance to the medicine. Tolerance means that you will need a higher dose of the medication for pain relief. Tolerance is normal and is expected if you take this medicine for a long time. °Do not suddenly stop taking your medicine because you may develop a severe reaction. Your body becomes used to the medicine. This does NOT mean you are addicted. Addiction is a behavior related to getting and using a drug for a non-medical reason. If you have pain, you have a medical reason to take pain medicine. Your doctor will tell you how much medicine to take. If your doctor wants you to stop the medicine, the dose will be slowly lowered over time to avoid any side effects. °You may get drowsy or dizzy. Do not drive, use machinery, or do anything that needs mental alertness until you know how this medicine affects you. Do not stand or sit up quickly, especially if you are an older patient. This reduces the risk of dizzy or fainting spells. Alcohol may interfere with the effect of this medicine. Avoid alcoholic drinks. °There are different types of narcotic medicines (opiates) for pain. If you take more than one type at the same time, you may have more side effects. Give your health care provider a list of all medicines you use. Your doctor will tell you how much medicine to take. Do not take more medicine than directed. Call emergency for help if you have problems breathing. °The medicine will cause constipation. Try to have a bowel movement at least every 2 to 3 days. If you do not have a bowel movement  for 3 days, call your doctor or health care professional. °Do not take Tylenol (acetaminophen) or medicines that have acetaminophen with this medicine. Too much acetaminophen can be very dangerous. Many nonprescription medicines contain acetaminophen. Always read the labels carefully to avoid taking more acetaminophen. °What side effects may I notice from receiving this medicine? °Side effects that you should report to your doctor or health care professional as soon as possible: °-allergic reactions like skin rash, itching or hives, swelling of the face, lips, or tongue °-breathing difficulties, wheezing °-confusion °-light headedness or fainting spells °-severe stomach pain °-unusually weak or tired °-yellowing of the skin or the whites of the eyes °Side effects that usually do not require medical attention (report to your doctor or health care professional if they continue or are bothersome): °-dizziness °-drowsiness °-nausea °-vomiting °This list may not describe all possible side effects. Call your doctor for medical advice about side effects. You may report side effects to FDA at 1-800-FDA-1088. °Where should I keep my medicine? °Keep out of the reach of children. This medicine can be abused. Keep your medicine in a safe place to protect it from theft. Do not share this medicine with anyone.   Selling or giving away this medicine is dangerous and against the law. °Store at room temperature between 20 and 25 degrees C (68 and 77 degrees F). Keep container tightly closed. Protect from light. °This medicine may cause accidental overdose and death if it is taken by other adults, children, or pets. Flush any unused medicine down the toilet to reduce the chance of harm. Do not use the medicine after the expiration date. °NOTE: This sheet is a summary. It may not cover all possible information. If you have questions about this medicine, talk to your doctor, pharmacist, or health care provider. °© 2015, [employer]. (2012-09-29 13:17:35) ° °Metoclopramide tablets °What is this medicine? °METOCLOPRAMIDE (met oh kloe PRA mide) is used to treat the symptoms of gastroesophageal reflux disease (GERD) like heartburn. It is also used to treat people with slow emptying of the stomach and intestinal tract. °This medicine may be used for other purposes; ask your health care provider or pharmacist if you have questions. °COMMON BRAND NAME(S): Reglan °What should I tell my health care provider before I take this medicine? °They need to know if you have any of these conditions: °-breast cancer °-depression °-diabetes °-heart failure °-high blood pressure °-kidney disease °-liver disease °-Parkinson's disease or a movement disorder °-pheochromocytoma °-seizures °-stomach obstruction, bleeding, or perforation °-an unusual or allergic reaction to metoclopramide, procainamide, sulfites, other medicines, foods, dyes, or preservatives °-pregnant or trying to get pregnant °-breast-feeding °How should I use this medicine? °Take this medicine by mouth with a glass of water. Follow the directions on the prescription label. Take this medicine on an empty stomach, about 30 minutes before eating. Take your doses at regular intervals. Do not take your medicine more often than directed. Do not stop taking except on the advice of your doctor or health care professional. °A special MedGuide will be given to you by the pharmacist with each prescription and refill. Be sure to read this information carefully each time. °Talk to your pediatrician regarding the use of this medicine in children. Special care may be needed. °Overdosage: If you think you have taken too much of this medicine contact a poison control center or emergency room at once. °NOTE: This medicine is only for you. Do not share this medicine with others. °What if I miss a dose? °If you miss a dose, take it as soon as you can. If it is almost time for your next dose, take only that  dose. Do not take double or extra doses. °What may interact with this medicine? °-acetaminophen °-cyclosporine °-digoxin °-medicines for blood pressure °-medicines for diabetes, including insulin °-medicines for hay fever and other allergies °-medicines for depression, especially an Monoamine Oxidase Inhibitor (MAOI) °-medicines for Parkinson's disease, like levodopa °-medicines for sleep or for pain °-tetracycline °This list may not describe all possible interactions. Give your health care provider a list of all the medicines, herbs, non-prescription drugs, or dietary supplements you use. Also tell them if you smoke, drink alcohol, or use illegal drugs. Some items may interact with your medicine. °What should I watch for while using this medicine? °It may take a few weeks for your stomach condition to start to get better. However, do not take this medicine for longer than 12 weeks. The longer you take this medicine, and the more you take it, the greater your chances are of developing serious side effects. If you are an elderly patient, a female patient, or you have diabetes, you may be at an increased risk for   side effects from this medicine. Contact your doctor immediately if you start having movements you cannot control such as lip smacking, rapid movements of the tongue, involuntary or uncontrollable movements of the eyes, head, arms and legs, or muscle twitches and spasms. Patients and their families should watch out for worsening depression or thoughts of suicide. Also watch out for any sudden or severe changes in feelings such as feeling anxious, agitated, panicky, irritable, hostile, aggressive, impulsive, severely restless, overly excited and hyperactive, or not being able to sleep. If this happens, especially at the beginning of treatment or after a change in dose, call your doctor. Do not treat yourself for high fever. Ask your doctor or health care professional for advice. You may get drowsy or dizzy.  Do not drive, use machinery, or do anything that needs mental alertness until you know how this drug affects you. Do not stand or sit up quickly, especially if you are an older patient. This reduces the risk of dizzy or fainting spells. Alcohol can make you more drowsy and dizzy. Avoid alcoholic drinks. What side effects may I notice from receiving this medicine? Side effects that you should report to your doctor or health care professional as soon as possible: -allergic reactions like skin rash, itching or hives, swelling of the face, lips, or tongue -abnormal production of milk in females -breast enlargement in both males and females -change in the way you walk -difficulty moving, speaking or swallowing -drooling, lip smacking, or rapid movements of the tongue -excessive sweating -fever -involuntary or uncontrollable movements of the eyes, head, arms and legs -irregular heartbeat or palpitations -muscle twitches and spasms -unusually weak or tired Side effects that usually do not require medical attention (report to your doctor or health care professional if they continue or are bothersome): -change in sex drive or performance -depressed mood -diarrhea -difficulty sleeping -headache -menstrual changes -restless or nervous This list may not describe all possible side effects. Call your doctor for medical advice about side effects. You may report side effects to FDA at 1-800-FDA-1088. Where should I keep my medicine? Keep out of the reach of children. Store at room temperature between 20 and 25 degrees C (68 and 77 degrees F). Protect from light. Keep container tightly closed. Throw away any unused medicine after the expiration date. NOTE: This sheet is a summary. It may not cover all possible information. If you have questions about this medicine, talk to your doctor, pharmacist, or health care provider.  2015, [employer]. (2011-06-05 13:04:38)

## 2013-10-07 ENCOUNTER — Encounter (HOSPITAL_COMMUNITY): Payer: Self-pay | Admitting: Emergency Medicine

## 2013-10-07 ENCOUNTER — Emergency Department (HOSPITAL_COMMUNITY)
Admission: EM | Admit: 2013-10-07 | Discharge: 2013-10-07 | Discharge status: Against Medical Advice | Payer: Medicaid Other | Attending: Emergency Medicine | Admitting: Emergency Medicine

## 2013-10-07 DIAGNOSIS — N898 Other specified noninflammatory disorders of vagina: Secondary | ICD-10-CM | POA: Insufficient documentation

## 2013-10-07 DIAGNOSIS — R109 Unspecified abdominal pain: Secondary | ICD-10-CM

## 2013-10-07 DIAGNOSIS — Z8639 Personal history of other endocrine, nutritional and metabolic disease: Secondary | ICD-10-CM | POA: Insufficient documentation

## 2013-10-07 DIAGNOSIS — Z8619 Personal history of other infectious and parasitic diseases: Secondary | ICD-10-CM | POA: Insufficient documentation

## 2013-10-07 DIAGNOSIS — Z792 Long term (current) use of antibiotics: Secondary | ICD-10-CM | POA: Insufficient documentation

## 2013-10-07 DIAGNOSIS — R1011 Right upper quadrant pain: Secondary | ICD-10-CM | POA: Insufficient documentation

## 2013-10-07 DIAGNOSIS — R1013 Epigastric pain: Secondary | ICD-10-CM | POA: Insufficient documentation

## 2013-10-07 DIAGNOSIS — Z8659 Personal history of other mental and behavioral disorders: Secondary | ICD-10-CM | POA: Insufficient documentation

## 2013-10-07 DIAGNOSIS — R112 Nausea with vomiting, unspecified: Secondary | ICD-10-CM | POA: Insufficient documentation

## 2013-10-07 DIAGNOSIS — M549 Dorsalgia, unspecified: Secondary | ICD-10-CM | POA: Insufficient documentation

## 2013-10-07 DIAGNOSIS — Z862 Personal history of diseases of the blood and blood-forming organs and certain disorders involving the immune mechanism: Secondary | ICD-10-CM | POA: Insufficient documentation

## 2013-10-07 LAB — CBC WITH DIFFERENTIAL/PLATELET
BASOS ABS: 0 10*3/uL (ref 0.0–0.1)
BASOS PCT: 0 % (ref 0–1)
Eosinophils Absolute: 0.1 10*3/uL (ref 0.0–0.7)
Eosinophils Relative: 1 % (ref 0–5)
HEMATOCRIT: 37.5 % (ref 36.0–46.0)
HEMOGLOBIN: 12.8 g/dL (ref 12.0–15.0)
Lymphocytes Relative: 26 % (ref 12–46)
Lymphs Abs: 3.1 10*3/uL (ref 0.7–4.0)
MCH: 29 pg (ref 26.0–34.0)
MCHC: 34.1 g/dL (ref 30.0–36.0)
MCV: 85 fL (ref 78.0–100.0)
MONOS PCT: 7 % (ref 3–12)
Monocytes Absolute: 0.8 10*3/uL (ref 0.1–1.0)
NEUTROS ABS: 7.9 10*3/uL — AB (ref 1.7–7.7)
Neutrophils Relative %: 66 % (ref 43–77)
Platelets: 251 10*3/uL (ref 150–400)
RBC: 4.41 MIL/uL (ref 3.87–5.11)
RDW: 13.9 % (ref 11.5–15.5)
WBC: 11.9 10*3/uL — AB (ref 4.0–10.5)

## 2013-10-07 LAB — WET PREP, GENITAL
TRICH WET PREP: NONE SEEN
YEAST WET PREP: NONE SEEN

## 2013-10-07 LAB — COMPREHENSIVE METABOLIC PANEL
ALT: 14 U/L (ref 0–35)
AST: 27 U/L (ref 0–37)
Albumin: 3.6 g/dL (ref 3.5–5.2)
Alkaline Phosphatase: 123 U/L — ABNORMAL HIGH (ref 39–117)
Anion gap: 12 (ref 5–15)
BUN: 10 mg/dL (ref 6–23)
CALCIUM: 9 mg/dL (ref 8.4–10.5)
CO2: 25 mEq/L (ref 19–32)
CREATININE: 1.04 mg/dL (ref 0.50–1.10)
Chloride: 100 mEq/L (ref 96–112)
GFR calc Af Amer: 88 mL/min — ABNORMAL LOW (ref >90)
GFR calc non Af Amer: 76 mL/min — ABNORMAL LOW (ref >90)
Glucose, Bld: 86 mg/dL (ref 70–99)
Potassium: 3.8 mEq/L (ref 3.7–5.3)
SODIUM: 137 meq/L (ref 137–147)
Total Bilirubin: 0.3 mg/dL (ref 0.3–1.2)
Total Protein: 7.6 g/dL (ref 6.0–8.3)

## 2013-10-07 LAB — LIPASE, BLOOD: LIPASE: 38 U/L (ref 11–59)

## 2013-10-07 MED ORDER — SODIUM CHLORIDE 0.9 % IV SOLN: INTRAVENOUS | Status: DC

## 2013-10-07 MED ORDER — SODIUM CHLORIDE 0.9 % IV BOLUS (SEPSIS): 250.0000 mL | Freq: Once | INTRAVENOUS | Status: DC

## 2013-10-07 MED ORDER — ONDANSETRON HCL 4 MG/2ML IJ SOLN: 4.0000 mg | Freq: Once | INTRAMUSCULAR | Status: DC

## 2013-10-07 NOTE — ED Notes (Signed)
Patient states she is having pain to mid abdomin, right abdomin, and lower abdomin. Patient states she has been seen and treated with pain medications for her gallbladder pain. Patient states she still has pain medications for this. Patient also states she was supposed to have her gallbladder removed, but was not able to make the appointment. Patient states she is [facility]. Patient also states she wants to be checked for an STD- patient states her urine has an odor and patient is having brown vaginal discharge X1 month.

## 2013-10-07 NOTE — ED Notes (Signed)
Patient refusing IV and treatment at this time, EDP notified

## 2013-10-07 NOTE — ED Notes (Signed)
Patient states "my ride is here, i need to go" EDP notified of patients request. EDP states patient can sign out AMA. Patient notified of AMA process and verbalizes understanding.

## 2013-10-07 NOTE — ED Notes (Signed)
Patient verbalizes understanding of AMA process and wishes to continue to sign out AMA. Patient ambulatory out of the department at this time escorted by family.

## 2013-10-07 NOTE — ED Notes (Signed)
Pt c/o RUQ pain. "It's my gallstones". Pt was seen 2 days ago for same. BIB EMS and placed in triage. NAD. Pt also requesting to be checked for STD's. Denies any discharge. States "I just want to be sure because I've had multiple partners"

## 2013-10-07 NOTE — ED Provider Notes (Signed)
CSN: [id number]     Arrival date & time 10/07/13  1849 History  This chart was scribed for Vanetta Mulders, MD by Bronson Curb, ED Scribe. This patient was seen in room APA03/APA03 and the patient's care was started at 9:55 PM.     Chief Complaint  Patient presents with  . Abdominal Pain      Patient is a 23 y.o. female presenting with abdominal pain. The history is provided by the patient. No language interpreter was used.  Abdominal Pain Pain location:  Epigastric Pain radiates to:  LLQ, RLQ and back Pain severity:  Moderate Onset quality:  Gradual Timing:  Intermittent Progression:  Waxing and waning Relieved by:  None tried Worsened by:  Nothing tried Ineffective treatments:  None tried Associated symptoms: nausea, vaginal discharge and vomiting   Associated symptoms: no chest pain, no chills, no diarrhea, no fever and no shortness of breath     HPI Comments: Katie Roberson is a 23 y.o. female brought in by ambulance, who presents to the Emergency Department complaining of intermittent, epigastric abdominal pain throughout the day, today. Patient was seen here 2 days ago for the same. Patient reports the pain radiates to her back and lower abdomen, and can last as long ad 1.5 hours. She currently rates her pain as 3/10, and a 9/10 at its worst. There is associated nausea and 2 episodes of emesis (onset 1700). Patient has not tried anything for symptom relief. She denies fever, chills , CP, diarrhea, SOB, leg swelling, dysuria, or hematuria. Patient has history of gallstones (2012), PCOS, and anxiety. Irregular menstrual periods. NKA to any medications.   Patient is also concerned for STD. There is associated vaginal discharge onset 4 months ago and lower abdominal pain for multiple months. She is requesting a pelvic exam and to be tested as well as treated for her symptoms.  Past Medical History  Diagnosis Date  . PCOS (polycystic ovarian syndrome)   . Chicken pox   .  Anxiety    History reviewed. No pertinent past surgical history. Family History  Problem Relation Age of Onset  . Diabetes Paternal Grandmother    History  Substance Use Topics  . Smoking status: Never Smoker   . Smokeless tobacco: Never Used  . Alcohol Use: No   OB History   Grav Para Term Preterm Abortions TAB SAB Ect Mult Living                 Review of Systems  Constitutional: Negative for fever and chills.  Eyes: Negative for visual disturbance.  Respiratory: Negative for shortness of breath.   Cardiovascular: Negative for chest pain and leg swelling.  Gastrointestinal: Positive for nausea, vomiting and abdominal pain. Negative for diarrhea.  Genitourinary: Positive for vaginal discharge.  Musculoskeletal: Positive for back pain.  Skin: Negative for rash.  Neurological: Negative for headaches.  Hematological: Does not bruise/bleed easily.  Psychiatric/Behavioral: Negative for confusion.      Allergies  Review of patient's allergies indicates no known allergies.  Home Medications   Prior to Admission medications   Medication Sig Start Date End Date Taking? Authorizing Provider  metoCLOPramide (REGLAN) 10 MG tablet Take 1 tablet (10 mg total) by mouth every 6 (six) hours as needed for nausea. 10/04/13  Yes Dione Booze, MD  metroNIDAZOLE (FLAGYL) 500 MG tablet Take 500 mg by mouth 2 (two) times daily. For 7 days(10/01/13)   Yes Historical Provider, MD  oxyCODONE-acetaminophen (PERCOCET/ROXICET) 5-325 MG per tablet Take 1 tablet  by mouth every 4 (four) hours as needed. 10/04/13  Yes Dione Booze, MD   Triage Vitals: BP 115/64  Pulse 86  Temp(Src) 98 F (36.7 C) (Oral)  Resp 24  Ht 5\' 9"  (1.753 m)  Wt 323 lb (146.512 kg)  BMI 47.68 kg/m2  SpO2 100%  LMP 10/03/2013  Physical Exam  Nursing note and vitals reviewed. Constitutional: She is oriented to person, place, and time. She appears well-developed and well-nourished. No distress.  HENT:  Head: Normocephalic  and atraumatic.  Eyes: Conjunctivae and EOM are normal. No scleral icterus.  Neck: Neck supple. No tracheal deviation present.  Cardiovascular: Normal rate, regular rhythm and normal heart sounds.   Pulmonary/Chest: Effort normal and breath sounds normal. No respiratory distress.  Abdominal: Bowel sounds are normal. There is tenderness.  TTP over lower quadrant. No upper quadrant tenderness.  Genitourinary: Uterus normal.  External genitalia was normal. The straw-colored vaginal discharge. No purulent or greenish discharge no evidence of yeast. No cervical motion tenderness uterus was nontender adnexa was nontender.  Musculoskeletal: Normal range of motion. She exhibits no edema.  Neurological: She is alert and oriented to person, place, and time. No cranial nerve deficit. She exhibits normal muscle tone. Coordination normal.  Skin: Skin is warm and dry.  Psychiatric: She has a normal mood and affect. Her behavior is normal.    ED Course  Procedures (including critical care time)  DIAGNOSTIC STUDIES: Oxygen Saturation is 100% on room air, normal by my interpretation.    COORDINATION OF CARE: At 2201 Discussed treatment plan with patient which includes pelvic exam and labs. Patient agrees.   Labs Review Labs Reviewed  COMPREHENSIVE METABOLIC PANEL  LIPASE, BLOOD  CBC WITH DIFFERENTIAL   Results for orders placed during the hospital encounter of 10/07/13  WET PREP, GENITAL      Result Value Ref Range   Yeast Wet Prep HPF POC NONE SEEN  NONE SEEN   Trich, Wet Prep NONE SEEN  NONE SEEN   Clue Cells Wet Prep HPF POC RARE (*) NONE SEEN   WBC, Wet Prep HPF POC FEW (*) NONE SEEN  COMPREHENSIVE METABOLIC PANEL      Result Value Ref Range   Sodium 137  137 - 147 mEq/L   Potassium 3.8  3.7 - 5.3 mEq/L   Chloride 100  96 - 112 mEq/L   CO2 25  19 - 32 mEq/L   Glucose, Bld 86  70 - 99 mg/dL   BUN 10  6 - 23 mg/dL   Creatinine, Ser 6.96  0.50 - 1.10 mg/dL   Calcium 9.0  8.4 - 29.5  mg/dL   Total Protein 7.6  6.0 - 8.3 g/dL   Albumin 3.6  3.5 - 5.2 g/dL   AST 27  0 - 37 U/L   ALT 14  0 - 35 U/L   Alkaline Phosphatase 123 (*) 39 - 117 U/L   Total Bilirubin 0.3  0.3 - 1.2 mg/dL   GFR calc non Af Amer 76 (*) >90 mL/min   GFR calc Af Amer 88 (*) >90 mL/min   Anion gap 12  5 - 15  LIPASE, BLOOD      Result Value Ref Range   Lipase 38  11 - 59 U/L  CBC WITH DIFFERENTIAL      Result Value Ref Range   WBC 11.9 (*) 4.0 - 10.5 K/uL   RBC 4.41  3.87 - 5.11 MIL/uL   Hemoglobin 12.8  12.0 - 15.0 g/dL  HCT 37.5  36.0 - 46.0 %   MCV 85.0  78.0 - 100.0 fL   MCH 29.0  26.0 - 34.0 pg   MCHC 34.1  30.0 - 36.0 g/dL   RDW 16.113.9  09.611.5 - 04.515.5 %   Platelets 251  150 - 400 K/uL   Neutrophils Relative % 66  43 - 77 %   Lymphocytes Relative 26  12 - 46 %   Monocytes Relative 7  3 - 12 %   Eosinophils Relative 1  0 - 5 %   Basophils Relative 0  0 - 1 %   Neutro Abs 7.9 (*) 1.7 - 7.7 K/uL   Lymphs Abs 3.1  0.7 - 4.0 K/uL   Monocytes Absolute 0.8  0.1 - 1.0 K/uL   Eosinophils Absolute 0.1  0.0 - 0.7 K/uL   Basophils Absolute 0.0  0.0 - 0.1 K/uL   WBC Morphology ATYPICAL LYMPHOCYTES       Imaging Review No results found.   EKG Interpretation None      MDM   Final diagnoses:  None    Patient with 2 complaints first complaint was recurrent epigastric right upper quadrant abdominal pain and intermittent throughout the day not lasting any longer than an hour to an hour and half. Associated with nausea and vomiting x2. Patient's pain stated to be currently 3/10 at its worse it was 9/10. Abdomen had no significant tenderness to epigastric area or right upper quadrant. Patient was diagnosed with gallstones 3 years ago reports that [facility].  Patient's other complaint was vaginal discharge and concern for STD. Patient was seen for biliary colic just 2 days ago. Pregnancy test was negative at that time. Patient  did not have any imaging of the abdomen at that time.  Patient's had to complain of the lower quadrant abdominal pain in the discharge for several weeks and did not notify them at the most recent ED visit.  Tonight pelvic was completed wet prep without any significant findings. As stated the procedures was -2 days ago not repeated.  Patient could not stay and left AMA prior to lab results being back. Patient's lab results are now back no significant leukocytosis no significant liver function test. Clinically no evidence of acute cholecystitis at this time. Pelvic exam had no significant findings. Pelvic cultures are pending.  I personally performed the services described in this documentation, which was scribed in my presence. The recorded information has been reviewed and is accurate.      Vanetta MuldersScott Edmund Rick, MD 10/07/13 [id number]

## 2013-10-08 ENCOUNTER — Telehealth (HOSPITAL_BASED_OUTPATIENT_CLINIC_OR_DEPARTMENT_OTHER): Payer: Self-pay | Admitting: Emergency Medicine

## 2013-10-09 LAB — GC/CHLAMYDIA PROBE AMP
CT Probe RNA: NEGATIVE
GC Probe RNA: NEGATIVE

## 2013-10-09 MED FILL — Oxycodone w/ Acetaminophen Tab 5-325 MG: ORAL | Qty: 6 | Status: AC

## 2013-12-18 ENCOUNTER — Other Ambulatory Visit: Payer: Self-pay | Admitting: Obstetrics and Gynecology

## 2013-12-18 ENCOUNTER — Telehealth: Payer: Self-pay | Admitting: Obstetrics and Gynecology

## 2013-12-18 MED ORDER — MEGESTROL ACETATE 40 MG PO TABS
40.0000 mg | ORAL_TABLET | Freq: Three times a day (TID) | ORAL | Status: DC
Start: 2013-12-18 — End: 2015-05-12

## 2013-12-18 NOTE — Telephone Encounter (Signed)
Pt advised that since it has been a year since she was seen last she would have to make an appointment before she could have a refill.

## 2013-12-28 ENCOUNTER — Ambulatory Visit: Payer: Self-pay | Admitting: Obstetrics & Gynecology

## 2014-01-28 ENCOUNTER — Ambulatory Visit: Payer: Self-pay | Admitting: Obstetrics & Gynecology

## 2014-03-05 ENCOUNTER — Ambulatory Visit: Payer: Self-pay | Admitting: Obstetrics & Gynecology

## 2014-03-08 ENCOUNTER — Ambulatory Visit: Payer: Self-pay | Admitting: Obstetrics & Gynecology

## 2014-03-09 ENCOUNTER — Ambulatory Visit: Payer: Self-pay | Admitting: Obstetrics & Gynecology

## 2014-03-12 ENCOUNTER — Other Ambulatory Visit: Payer: Self-pay | Admitting: Obstetrics & Gynecology

## 2014-03-15 ENCOUNTER — Emergency Department (HOSPITAL_COMMUNITY)
Admission: EM | Admit: 2014-03-15 | Discharge: 2014-03-15 | Disposition: A | Discharge status: Home / Self Care | Payer: Medicaid Other | Attending: Emergency Medicine | Admitting: Emergency Medicine

## 2014-03-15 ENCOUNTER — Emergency Department (HOSPITAL_COMMUNITY): Payer: Medicaid Other

## 2014-03-15 ENCOUNTER — Encounter (HOSPITAL_COMMUNITY): Payer: Self-pay | Admitting: *Deleted

## 2014-03-15 DIAGNOSIS — Y9389 Activity, other specified: Secondary | ICD-10-CM | POA: Insufficient documentation

## 2014-03-15 DIAGNOSIS — Z8619 Personal history of other infectious and parasitic diseases: Secondary | ICD-10-CM | POA: Insufficient documentation

## 2014-03-15 DIAGNOSIS — X58XXXA Exposure to other specified factors, initial encounter: Secondary | ICD-10-CM | POA: Insufficient documentation

## 2014-03-15 DIAGNOSIS — Y9289 Other specified places as the place of occurrence of the external cause: Secondary | ICD-10-CM | POA: Insufficient documentation

## 2014-03-15 DIAGNOSIS — S39012A Strain of muscle, fascia and tendon of lower back, initial encounter: Secondary | ICD-10-CM

## 2014-03-15 DIAGNOSIS — Z8719 Personal history of other diseases of the digestive system: Secondary | ICD-10-CM | POA: Insufficient documentation

## 2014-03-15 DIAGNOSIS — Z8639 Personal history of other endocrine, nutritional and metabolic disease: Secondary | ICD-10-CM | POA: Insufficient documentation

## 2014-03-15 DIAGNOSIS — F419 Anxiety disorder, unspecified: Secondary | ICD-10-CM | POA: Insufficient documentation

## 2014-03-15 DIAGNOSIS — Y998 Other external cause status: Secondary | ICD-10-CM | POA: Insufficient documentation

## 2014-03-15 DIAGNOSIS — Z3202 Encounter for pregnancy test, result negative: Secondary | ICD-10-CM | POA: Insufficient documentation

## 2014-03-15 HISTORY — DX: Calculus of gallbladder without cholecystitis without obstruction: K80.20

## 2014-03-15 LAB — POC URINE PREG, ED: Preg Test, Ur: NEGATIVE

## 2014-03-15 MED ORDER — IBUPROFEN 800 MG PO TABS
800.0000 mg | ORAL_TABLET | Freq: Once | ORAL | Status: AC
  Administered 2014-03-15: 800 mg via ORAL
  Filled 2014-03-15: qty 1

## 2014-03-15 MED ORDER — CYCLOBENZAPRINE HCL 5 MG PO TABS: 5.0000 mg | ORAL_TABLET | Freq: Three times a day (TID) | ORAL | Status: DC | PRN

## 2014-03-15 MED ORDER — TRAMADOL HCL 50 MG PO TABS: 50.0000 mg | ORAL_TABLET | Freq: Four times a day (QID) | ORAL | Status: DC | PRN

## 2014-03-15 NOTE — Discharge Instructions (Signed)
Lumbosacral Strain Lumbosacral strain is a strain of any of the parts that make up your lumbosacral vertebrae. Your lumbosacral vertebrae are the bones that make up the lower third of your backbone. Your lumbosacral vertebrae are held together by muscles and tough, fibrous tissue (ligaments).  CAUSES  A sudden blow to your back can cause lumbosacral strain. Also, anything that causes an excessive stretch of the muscles in the low back can cause this strain. This is typically seen when people exert themselves strenuously, fall, lift heavy objects, bend, or crouch repeatedly. RISK FACTORS  Physically demanding work.  Participation in pushing or pulling sports or sports that require a sudden twist of the back (tennis, golf, baseball).  Weight lifting.  Excessive lower back curvature.  Forward-tilted pelvis.  Weak back or abdominal muscles or both.  Tight hamstrings. SIGNS AND SYMPTOMS  Lumbosacral strain may cause pain in the area of your injury or pain that moves (radiates) down your leg.  DIAGNOSIS Your health care provider can often diagnose lumbosacral strain through a physical exam. In some cases, you may need tests such as X-ray exams.  TREATMENT  Treatment for your lower back injury depends on many factors that your clinician will have to evaluate. However, most treatment will include the use of anti-inflammatory medicines. HOME CARE INSTRUCTIONS   Avoid hard physical activities (tennis, racquetball, waterskiing) if you are not in proper physical condition for it. This may aggravate or create problems.  If you have a back problem, avoid sports requiring sudden body movements. Swimming and walking are generally safer activities.  Maintain good posture.  Maintain a healthy weight.  For acute conditions, you may put ice on the injured area.  Put ice in a plastic bag.  Place a towel between your skin and the bag.  Leave the ice on for 20 minutes, 2-3 times a day.  When the  low back starts healing, stretching and strengthening exercises may be recommended. SEEK MEDICAL CARE IF:  Your back pain is getting worse.  You experience severe back pain not relieved with medicines. SEEK IMMEDIATE MEDICAL CARE IF:   You have numbness, tingling, weakness, or problems with the use of your arms or legs.  There is a change in bowel or bladder control.  You have increasing pain in any area of the body, including your belly (abdomen).  You notice shortness of breath, dizziness, or feel faint.  You feel sick to your stomach (nauseous), are throwing up (vomiting), or become sweaty.  You notice discoloration of your toes or legs, or your feet get very cold. MAKE SURE YOU:   Understand these instructions.  Will watch your condition.  Will get help right away if you are not doing well or get worse. Document Released: 11/15/2004 Document Revised: 02/10/2013 Document Reviewed: 09/24/2012 [facility] Patient Information 2015 [employer] [place]. This information is not intended to replace advice given to you by your health care provider. Make sure you discuss any questions you have with your health care provider.  Your x-rays are normal today.  Take your medicines as directed.  Do not drive within 4 hours of taking these medicines as they may make you drowsy.  Avoid lifting,  Bending,  Twisting or any other activity that worsens your pain over the next week.  Apply a heating pad to your lower back for 20 minutes several times daily.   You should get rechecked if your symptoms are not better over the next 5 days,  Or you develop increased pain,  Weakness in your leg(s) or loss of bladder or bowel function - these are symptoms of a worse injury.

## 2014-03-15 NOTE — ED Notes (Signed)
Pt verbalized understanding of no driving and to use caution within 4 hours of taking pain meds due to meds cause drowsiness 

## 2014-03-15 NOTE — ED Provider Notes (Signed)
CSN: [id number]     Arrival date & time 03/15/14  1807 History  This chart was scribed for Burgess Amor, PA-C working with No att. providers found by Elveria Rising, ED Scribe. This patient was seen in room APFT20/APFT20 and the patient's care was started at 7:22 PM.   Chief Complaint  Patient presents with  . Back Pain   The history is provided by the patient. No language interpreter was used.   HPI Comments: Katie Roberson is a 24 y.o. female who presents to the Emergency Department complaining of constant mid and upper back pain ongoing for 1.5 months now. Patient reports history of similar pain when she was younger; she never received any imaging. Patient has not been evaluated for her back pain since onset. Patient describes pain as pressure and pulling states that she feels like "a nerve problem." Patient works at [employer]; she reports prolonged standing, she does not engage in heavy lifting. Patient reports sudden onset pain 1.5 months; she is unable to recall her activity at onset but she direct trauma or injury. Patient reports radiation of the pulling pain in her hips and mid thigh bilaterally. Patient reports treatment with several OTC medications, but has been unable to find relief. Patient denies alleviating factors stating that there is no position that makes her pain better.  Patient denies weakness in her legs, urinary symptoms, or bladder/bowel incontinence.   Past Medical History  Diagnosis Date  . PCOS (polycystic ovarian syndrome)   . Chicken pox   . Anxiety   . Gallstones    History reviewed. No pertinent past surgical history. Family History  Problem Relation Age of Onset  . Diabetes Paternal Grandmother    History  Substance Use Topics  . Smoking status: Never Smoker   . Smokeless tobacco: Never Used  . Alcohol Use: No   OB History    No data available     Review of Systems  Constitutional: Negative for fever and chills.  Genitourinary: Negative for  hematuria.  Musculoskeletal: Positive for back pain.  Neurological: Negative for weakness and numbness.   Allergies  Review of patient's allergies indicates no known allergies.  Home Medications   Prior to Admission medications   Medication Sig Start Date End Date Taking? Authorizing Provider  ibuprofen (ADVIL,MOTRIN) 200 MG tablet Take 800 mg by mouth every 6 (six) hours as needed for headache, mild pain or moderate pain.   Yes Historical Provider, MD  cyclobenzaprine (FLEXERIL) 5 MG tablet Take 1 tablet (5 mg total) by mouth 3 (three) times daily as needed for muscle spasms. 03/15/14   Burgess Amor, PA-C  megestrol (MEGACE) 40 MG tablet Take 1 tablet (40 mg total) by mouth 3 (three) times daily. Patient not taking: Reported on 03/15/2014 12/18/13   Tilda Burrow, MD  metoCLOPramide (REGLAN) 10 MG tablet Take 1 tablet (10 mg total) by mouth every 6 (six) hours as needed for nausea. Patient not taking: Reported on 03/15/2014 10/04/13   Dione Booze, MD  oxyCODONE-acetaminophen (PERCOCET/ROXICET) 5-325 MG per tablet Take 1 tablet by mouth every 4 (four) hours as needed. Patient not taking: Reported on 03/15/2014 10/04/13   Dione Booze, MD  traMADol (ULTRAM) 50 MG tablet Take 1 tablet (50 mg total) by mouth every 6 (six) hours as needed. 03/15/14   Burgess Amor, PA-C   Triage Vitals: BP 135/80 mmHg  Pulse 74  Temp(Src) 99.3 F (37.4 C) (Oral)  Resp 18  Ht  (1.727 m)  Wt 323  lb (146.512 kg)  BMI 49.12 kg/m2  SpO2 100%  LMP  Physical Exam  Constitutional: She appears well-developed and well-nourished.  HENT:  Head: Normocephalic.  Eyes: Conjunctivae are normal.  Neck: Normal range of motion. Neck supple.  Cardiovascular: Normal rate and intact distal pulses.   Pedal pulses normal.  Pulmonary/Chest: Effort normal.  Abdominal: Soft. Bowel sounds are normal. She exhibits no distension and no mass.  Musculoskeletal: Normal range of motion. She exhibits no edema.       Lumbar back: She  exhibits tenderness. She exhibits no swelling, no edema and no spasm.  Neurological: She is alert. She has normal strength. She displays no atrophy and no tremor. No sensory deficit. Gait normal.  Reflex Scores:      Patellar reflexes are 2+ on the right side and 2+ on the left side.      Achilles reflexes are 2+ on the right side and 2+ on the left side. No strength deficit noted in hip and knee flexor and extensor muscle groups.  Ankle flexion and extension intact.  Skin: Skin is warm and dry.  Psychiatric: She has a normal mood and affect.  Nursing note and vitals reviewed.   ED Course  Procedures (including critical care time)  COORDINATION OF CARE: 7:28 PM- Plans to obtain imaging. Discussed treatment plan with patient at bedside and patient agreed to plan.   Labs Review Labs Reviewed  POC URINE PREG, ED    Imaging Review Dg Lumbar Spine Complete  03/15/2014   CLINICAL DATA:  Generalized low back pain for 1 1/2 months without an injury. Initial encounter.  EXAM: LUMBAR SPINE - COMPLETE 4+ VIEW  COMPARISON:  None.  FINDINGS: There is no evidence of lumbar spine fracture. Alignment is normal. Intervertebral disc spaces are maintained.  IMPRESSION: Negative.   Electronically Signed   By: Andreas NewportGeoffrey  Lamke M.D.   On: 03/15/2014 20:27     EKG Interpretation None      MDM   Final diagnoses:  Lumbar strain, initial encounter    No neuro deficit on exam or by history to suggest emergent or surgical presentation.  Also discussed worsened sx that should prompt immediate re-evaluation including distal weakness, bowel/bladder retention/incontinence.  Pt prescribed tramadol, flexeril, advised heat tx, f/u prn if not improving.  Referrals given for establishing pcp.    I personally performed the services described in this documentation, which was scribed in my presence. The recorded information has been reviewed and is accurate.    Burgess AmorJulie Jaishaun Mcnab, PA-C 03/16/14 1238

## 2014-03-15 NOTE — ED Notes (Signed)
Pain mid and low back for 2 mos

## 2014-03-22 ENCOUNTER — Other Ambulatory Visit: Payer: Self-pay | Admitting: Obstetrics & Gynecology

## 2014-04-07 ENCOUNTER — Other Ambulatory Visit (HOSPITAL_COMMUNITY)
Admission: RE | Admit: 2014-04-07 | Discharge: 2014-04-07 | Disposition: A | Discharge status: Home / Self Care | Payer: Medicaid Other | Source: Ambulatory Visit | Attending: Obstetrics and Gynecology | Admitting: Obstetrics and Gynecology

## 2014-04-07 ENCOUNTER — Encounter: Payer: Self-pay | Admitting: Obstetrics and Gynecology

## 2014-04-07 ENCOUNTER — Ambulatory Visit (INDEPENDENT_AMBULATORY_CARE_PROVIDER_SITE_OTHER): Payer: Medicaid Other | Admitting: Obstetrics and Gynecology

## 2014-04-07 VITALS — BP 120/78 | Ht 68.0 in | Wt 323.0 lb

## 2014-04-07 DIAGNOSIS — Z309 Encounter for contraceptive management, unspecified: Secondary | ICD-10-CM

## 2014-04-07 DIAGNOSIS — Z01411 Encounter for gynecological examination (general) (routine) with abnormal findings: Secondary | ICD-10-CM | POA: Diagnosis present

## 2014-04-07 DIAGNOSIS — Z113 Encounter for screening for infections with a predominantly sexual mode of transmission: Secondary | ICD-10-CM | POA: Insufficient documentation

## 2014-04-07 DIAGNOSIS — Z01419 Encounter for gynecological examination (general) (routine) without abnormal findings: Secondary | ICD-10-CM

## 2014-04-07 DIAGNOSIS — N938 Other specified abnormal uterine and vaginal bleeding: Secondary | ICD-10-CM

## 2014-04-07 DIAGNOSIS — E282 Polycystic ovarian syndrome: Secondary | ICD-10-CM

## 2014-04-07 DIAGNOSIS — Z308 Encounter for other contraceptive management: Secondary | ICD-10-CM

## 2014-04-07 LAB — POCT URINE PREGNANCY: PREG TEST UR: NEGATIVE

## 2014-04-07 MED ORDER — MEDROXYPROGESTERONE ACETATE 10 MG PO TABS: 10.0000 mg | ORAL_TABLET | Freq: Every day | ORAL | Status: DC

## 2014-04-07 NOTE — Progress Notes (Signed)
Patient ID: Katie Roberson, female   DOB: 19-Sep-1990, 24 y.o.   MRN: [id number] Pt here today for annual exam. Pt denies any problems or concerns at this time. The patient desires pregnancy, has chronic anovulation see notes below. She's been on Megace intermittently in the past  Assessment:  Annual Gyn Exam  PCO, with abnormal uterine bleeding due to anovulation Hx Megace tx, none recently Plan:  1. pap smear done, next pap due 696yr 2. return annually or prn 3    Annual mammogram advised beginning at 40 4.  Will begin Provera 10 mg 14 days, follow-up 6 weeks for consideration of other treatments for anovulation Subjective:  Katie Roberson is a 24 y.o. female No obstetric history on file. who presents for annual exam. Lmp Dec 15 and continuing irregularly since. The patient has complaints today of desires pregnancy, sexually active without contraception.  The following portions of the patient's history were reviewed and updated as appropriate: allergies, current medications, past family history, past medical history, past social history, past surgical history and problem list. Past Medical History  Diagnosis Date  . PCOS (polycystic ovarian syndrome)   . Chicken pox   . Anxiety   . Gallstones   Morbid obesity  History reviewed. No pertinent past surgical history.   Current outpatient prescriptions:  .  ibuprofen (ADVIL,MOTRIN) 200 MG tablet, Take 800 mg by mouth every 6 (six) hours as needed for headache, mild pain or moderate pain., Disp: , Rfl:  .  megestrol (MEGACE) 40 MG tablet, Take 1 tablet (40 mg total) by mouth 3 (three) times daily. (Patient not taking: Reported on 03/15/2014), Disp: 45 tablet, Rfl: 0  Review of Systems Constitutional: negative Gastrointestinal: negative Genitourinary: lite irreg menses  Objective:  BP 120/78 mmHg  Ht 5\' 8"  (1.727 m)  Wt 323 lb (146.512 kg)  BMI 49.12 kg/m2   BMI: Body mass index is 49.12 kg/(m^2).  General Appearance:  Alert, appropriate appearance for age. No acute distress HEENT: Grossly normal Neck / Thyroid:  Cardiovascular: RRR; normal S1, S2, no murmur Lungs: CTA bilaterally Back: No CVAT Breast Exam: No dimpling, nipple retraction or discharge. No masses or nodes. and No masses or nodes.No dimpling, nipple retraction or discharge. Gastrointestinal: Soft, non-tender, no masses or organomegaly Pelvic Exam: Vulva and vagina appear normal. Bimanual exam reveals normal uterus and adnexa. Cervix: normal appearance Adnexa: normal bimanual exam Uterus: normal single, nontender Exam limited by body habitus and Obesity Rectovaginal: not indicated Lymphatic Exam: Non-palpable nodes in neck, clavicular, axillary, or inguinal regions Skin: no rash or abnormalities Neurologic: Normal gait and speech, no tremor  Psychiatric: Alert and oriented, appropriate affect.  Urinalysis:Not done  Christin BachJohn Lucca Greggs. MD Pgr [phone] 8:55 PM

## 2014-04-09 LAB — CYTOLOGY - PAP

## 2014-04-12 ENCOUNTER — Telehealth: Payer: Self-pay | Admitting: Obstetrics & Gynecology

## 2014-04-12 NOTE — Telephone Encounter (Signed)
Left message x 1. JSY 

## 2014-04-13 ENCOUNTER — Telehealth: Payer: Self-pay | Admitting: Obstetrics and Gynecology

## 2014-04-13 NOTE — Telephone Encounter (Signed)
Pt informed of WNL pap on 04/07/2014. Negative GC/CHL.

## 2014-04-13 NOTE — Telephone Encounter (Signed)
Spoke with pt letting her know pap was normal and GC/CHL was also negative. JSY

## 2014-04-13 NOTE — Telephone Encounter (Signed)
Left message x 2. JSY 

## 2014-04-20 ENCOUNTER — Other Ambulatory Visit: Payer: Medicaid Other | Admitting: Obstetrics & Gynecology

## 2014-05-06 ENCOUNTER — Telehealth: Payer: Self-pay | Admitting: Obstetrics & Gynecology

## 2014-05-06 NOTE — Telephone Encounter (Signed)
Pt informed of WNL pap, neg GC/CHL from 04/07/2014.

## 2014-05-10 ENCOUNTER — Emergency Department (HOSPITAL_COMMUNITY)
Admission: EM | Admit: 2014-05-10 | Discharge: 2014-05-11 | Disposition: A | Discharge status: Home / Self Care | Payer: Medicaid Other | Attending: Emergency Medicine | Admitting: Emergency Medicine

## 2014-05-10 ENCOUNTER — Encounter (HOSPITAL_COMMUNITY): Payer: Self-pay | Admitting: *Deleted

## 2014-05-10 DIAGNOSIS — Z79899 Other long term (current) drug therapy: Secondary | ICD-10-CM | POA: Insufficient documentation

## 2014-05-10 DIAGNOSIS — Z8659 Personal history of other mental and behavioral disorders: Secondary | ICD-10-CM | POA: Insufficient documentation

## 2014-05-10 DIAGNOSIS — R1011 Right upper quadrant pain: Secondary | ICD-10-CM | POA: Insufficient documentation

## 2014-05-10 DIAGNOSIS — R1013 Epigastric pain: Secondary | ICD-10-CM | POA: Insufficient documentation

## 2014-05-10 DIAGNOSIS — Z8619 Personal history of other infectious and parasitic diseases: Secondary | ICD-10-CM | POA: Insufficient documentation

## 2014-05-10 DIAGNOSIS — Z8719 Personal history of other diseases of the digestive system: Secondary | ICD-10-CM | POA: Insufficient documentation

## 2014-05-10 DIAGNOSIS — Z8639 Personal history of other endocrine, nutritional and metabolic disease: Secondary | ICD-10-CM | POA: Insufficient documentation

## 2014-05-10 LAB — CBC WITH DIFFERENTIAL/PLATELET
Basophils Absolute: 0 10*3/uL (ref 0.0–0.1)
Basophils Relative: 0 % (ref 0–1)
Eosinophils Absolute: 0.2 10*3/uL (ref 0.0–0.7)
Eosinophils Relative: 1 % (ref 0–5)
HCT: 33.2 % — ABNORMAL LOW (ref 36.0–46.0)
HEMOGLOBIN: 11.2 g/dL — AB (ref 12.0–15.0)
LYMPHS ABS: 4.1 10*3/uL — AB (ref 0.7–4.0)
LYMPHS PCT: 27 % (ref 12–46)
MCH: 29.1 pg (ref 26.0–34.0)
MCHC: 33.7 g/dL (ref 30.0–36.0)
MCV: 86.2 fL (ref 78.0–100.0)
MONO ABS: 0.9 10*3/uL (ref 0.1–1.0)
MONOS PCT: 6 % (ref 3–12)
NEUTROS ABS: 9.9 10*3/uL — AB (ref 1.7–7.7)
Neutrophils Relative %: 66 % (ref 43–77)
Platelets: 330 10*3/uL (ref 150–400)
RBC: 3.85 MIL/uL — ABNORMAL LOW (ref 3.87–5.11)
RDW: 13.2 % (ref 11.5–15.5)
WBC: 15.2 10*3/uL — ABNORMAL HIGH (ref 4.0–10.5)

## 2014-05-10 LAB — COMPREHENSIVE METABOLIC PANEL
ALT: 16 U/L (ref 0–35)
AST: 22 U/L (ref 0–37)
Albumin: 4 g/dL (ref 3.5–5.2)
Alkaline Phosphatase: 119 U/L — ABNORMAL HIGH (ref 39–117)
Anion gap: 5 (ref 5–15)
BUN: 9 mg/dL (ref 6–23)
CHLORIDE: 106 mmol/L (ref 96–112)
CO2: 26 mmol/L (ref 19–32)
CREATININE: 0.95 mg/dL (ref 0.50–1.10)
Calcium: 8.9 mg/dL (ref 8.4–10.5)
GFR, EST NON AFRICAN AMERICAN: 84 mL/min — AB (ref >90)
GLUCOSE: 88 mg/dL (ref 70–99)
POTASSIUM: 3.5 mmol/L (ref 3.5–5.1)
Sodium: 137 mmol/L (ref 135–145)
Total Bilirubin: 0.2 mg/dL — ABNORMAL LOW (ref 0.3–1.2)
Total Protein: 7.8 g/dL (ref 6.0–8.3)

## 2014-05-10 LAB — LIPASE, BLOOD: Lipase: 41 U/L (ref 11–59)

## 2014-05-10 MED ORDER — MORPHINE SULFATE 4 MG/ML IJ SOLN
4.0000 mg | Freq: Once | INTRAMUSCULAR | Status: AC
Start: 2014-05-10 — End: 2014-05-10
  Administered 2014-05-10: 4 mg via INTRAVENOUS
  Filled 2014-05-10: qty 1

## 2014-05-10 MED ORDER — SODIUM CHLORIDE 0.9 % IV BOLUS (SEPSIS)
1000.0000 mL | Freq: Once | INTRAVENOUS | Status: AC
  Administered 2014-05-10: 1000 mL via INTRAVENOUS

## 2014-05-10 MED ORDER — ONDANSETRON HCL 4 MG/2ML IJ SOLN
4.0000 mg | Freq: Once | INTRAMUSCULAR | Status: AC
  Administered 2014-05-10: 4 mg via INTRAVENOUS
  Filled 2014-05-10: qty 2

## 2014-05-10 NOTE — ED Notes (Signed)
Pt reporting she is having gallstone pain.  States she has been unable to schedule surgery. Reports pain began this morning.  Pt denies nausea or vomiting.

## 2014-05-10 NOTE — ED Provider Notes (Signed)
CSN: [id number]     Arrival date & time 05/10/14  1858 History   First MD Initiated Contact with Patient 05/10/14 2053    This chart was scribed for No att. providers found by Marica Otter, ED Scribe. This patient was seen in room APA06/APA06 and the patient's care was started at 9:10 PM.  Chief Complaint  Patient presents with  . Abdominal Pain    The history is provided by the patient. No language interpreter was used.   PCP: No PCP Per Patient HPI Comments: Katie Roberson is a 24 y.o. female, with PMH noted below including Hx of cholelithiasis Dx one year ago, who presents to the Emergency Department complaining of epigastric abd pain onset this morning. Pt rates her pain a 7 out of 10. Pt notes that she plans to schedule her cholecystectomy, however, due to financial reasons has not been able to do so yet. Pt denies n/v, fever, chills; pt notes she has been able to eat today without issue.   Past Medical History  Diagnosis Date  . PCOS (polycystic ovarian syndrome)   . Chicken pox   . Anxiety   . Gallstones    History reviewed. No pertinent past surgical history. Family History  Problem Relation Age of Onset  . Diabetes Paternal Grandmother    History  Substance Use Topics  . Smoking status: Never Smoker   . Smokeless tobacco: Never Used  . Alcohol Use: No   OB History    No data available     Review of Systems  Constitutional: Negative for fever, chills and appetite change.  Gastrointestinal: Positive for abdominal pain.  Psychiatric/Behavioral: Negative for confusion.   A complete 10 system review of systems was obtained and all systems are negative except as noted in the HPI and PMH.   Allergies  Review of patient's allergies indicates no known allergies.  Home Medications   Prior to Admission medications   Medication Sig Start Date End Date Taking? Authorizing Provider  ibuprofen (ADVIL,MOTRIN) 200 MG tablet Take 800 mg by mouth every 6 (six) hours as  needed for headache, mild pain or moderate pain.   Yes Historical Provider, MD  medroxyPROGESTERone (PROVERA) 10 MG tablet Take 1 tablet (10 mg total) by mouth daily. One tablet daily x 2 weeks. Repeat as directed 04/07/14  Yes Tilda Burrow, MD  megestrol (MEGACE) 40 MG tablet Take 1 tablet (40 mg total) by mouth 3 (three) times daily. Patient not taking: Reported on 03/15/2014 12/18/13   Tilda Burrow, MD  oxyCODONE-acetaminophen (PERCOCET) 5-325 MG per tablet Take 2 tablets by mouth every 4 (four) hours as needed. 05/11/14   Donnetta Hutching, MD  promethazine (PHENERGAN) 25 MG tablet Take 1 tablet (25 mg total) by mouth every 6 (six) hours as needed. 05/11/14   Donnetta Hutching, MD   Triage Vitals: BP 124/77 mmHg  Pulse 68  Temp(Src) 98.1 F (36.7 C) (Oral)  Resp 18  Ht  (1.727 m)  Wt 320 lb (145.151 kg)  BMI 48.67 kg/m2  SpO2 100% Physical Exam  Constitutional: She is oriented to person, place, and time. She appears well-developed and well-nourished.  Non-toxic appearing  HENT:  Head: Normocephalic and atraumatic.  Eyes: Conjunctivae and EOM are normal. Pupils are equal, round, and reactive to light.  Neck: Normal range of motion. Neck supple.  Cardiovascular: Normal rate and regular rhythm.   Pulmonary/Chest: Effort normal and breath sounds normal.  Abdominal: Soft. Bowel sounds are normal. There is tenderness  in the right upper quadrant and epigastric area.  Musculoskeletal: Normal range of motion.  Neurological: She is alert and oriented to person, place, and time.  Skin: Skin is warm and dry.  Psychiatric: She has a normal mood and affect. Her behavior is normal.  Nursing note and vitals reviewed.   ED Course  Procedures (including critical care time) DIAGNOSTIC STUDIES: Oxygen Saturation is 100% on RA, nl by my interpretation.    COORDINATION OF CARE: 9:15 PM-Discussed treatment plan which includes labs and meds with pt at bedside and pt agreed to plan.   Labs Review Labs  Reviewed  COMPREHENSIVE METABOLIC PANEL - Abnormal; Notable for the following:    Alkaline Phosphatase 119 (*)    Total Bilirubin 0.2 (*)    GFR calc non Af Amer 84 (*)    All other components within normal limits  CBC WITH DIFFERENTIAL/PLATELET - Abnormal; Notable for the following:    WBC 15.2 (*)    RBC 3.85 (*)    Hemoglobin 11.2 (*)    HCT 33.2 (*)    Neutro Abs 9.9 (*)    Lymphs Abs 4.1 (*)    All other components within normal limits  LIPASE, BLOOD    Imaging Review No results found.   EKG Interpretation None      MDM   Final diagnoses:  Epigastric pain    Patient has a known history of cholelithiasis; however ultrasound was not performed in the [facility] system. She is afebrile. White count elevated.  Alkaline phosphatase slightly elevated. No acute abdomen. Patient encouraged to see general surgeon.  Discharge medication Percocet and Phenergan 25 mg.  She will f/u with Dr Zenon MayoM Jenkins  I personally performed the services described in this documentation, which was scribed in my presence. The recorded information has been reviewed and is accurate.    Donnetta HutchingBrian Johsua Shevlin, MD 05/12/14 [phone]

## 2014-05-11 MED ORDER — PROMETHAZINE HCL 25 MG PO TABS: 25.0000 mg | ORAL_TABLET | Freq: Four times a day (QID) | ORAL | Status: DC | PRN

## 2014-05-11 MED ORDER — MORPHINE SULFATE 4 MG/ML IJ SOLN
INTRAMUSCULAR | Status: AC
  Filled 2014-05-11: qty 1

## 2014-05-11 MED ORDER — MORPHINE SULFATE 4 MG/ML IJ SOLN
4.0000 mg | Freq: Once | INTRAMUSCULAR | Status: AC
Start: 2014-05-11 — End: 2014-05-11
  Administered 2014-05-11: 4 mg via INTRAVENOUS

## 2014-05-11 MED ORDER — OXYCODONE-ACETAMINOPHEN 5-325 MG PO TABS: 2.0000 | ORAL_TABLET | ORAL | Status: DC | PRN

## 2014-05-11 NOTE — Discharge Instructions (Signed)
Medication for pain and nausea. Return Thursday for ultrasound of your gallbladder. Nurse will give you time. Avoid rich greasy foods. You'll most likely need to see a surgeon at some point. Phone number given.

## 2014-05-13 ENCOUNTER — Other Ambulatory Visit (HOSPITAL_COMMUNITY): Payer: Self-pay | Admitting: Emergency Medicine

## 2014-05-13 ENCOUNTER — Ambulatory Visit (HOSPITAL_COMMUNITY)
Admit: 2014-05-13 | Discharge: 2014-05-13 | Disposition: A | Discharge status: Home / Self Care | Payer: Medicaid Other | Attending: Emergency Medicine | Admitting: Emergency Medicine

## 2014-05-13 DIAGNOSIS — R1084 Generalized abdominal pain: Secondary | ICD-10-CM

## 2014-05-13 DIAGNOSIS — K8020 Calculus of gallbladder without cholecystitis without obstruction: Secondary | ICD-10-CM | POA: Insufficient documentation

## 2014-05-13 NOTE — ED Provider Notes (Signed)
12:06 PM  Patient is a 24 year old female who was seen in the emergency department on March 21 with abdominal pain. Patient has a history of gallstones, and was scheduled for an outpatient abdominal ultrasound today. The abdominal ultrasound reveals multiple gallstones, with the largest being 3 cm. The wall thickness is within normal limits. There is no ductal dilatation.  I have reviewed the findings with the patient. I have strongly encouraged that she keep her appointment with Dr. Lovell SheehanJenkins as suggested by Dr. Adriana Simasook at her discharge. The patient acknowledges understanding of the findings from the ultrasound study, and the need for surgical consultation.    Katie QualeHobson Saylee Sherrill, PA-C 05/13/14 1208  Rolland PorterMark James, MD 05/13/14 [phone]

## 2014-06-06 ENCOUNTER — Encounter (HOSPITAL_COMMUNITY): Payer: Self-pay | Admitting: Emergency Medicine

## 2014-06-06 ENCOUNTER — Emergency Department (HOSPITAL_COMMUNITY): Payer: Medicaid Other

## 2014-06-06 ENCOUNTER — Emergency Department (HOSPITAL_COMMUNITY)
Admission: EM | Admit: 2014-06-06 | Discharge: 2014-06-06 | Disposition: A | Discharge status: Home / Self Care | Payer: Self-pay | Attending: Emergency Medicine | Admitting: Emergency Medicine

## 2014-06-06 DIAGNOSIS — K8020 Calculus of gallbladder without cholecystitis without obstruction: Secondary | ICD-10-CM | POA: Insufficient documentation

## 2014-06-06 DIAGNOSIS — R109 Unspecified abdominal pain: Secondary | ICD-10-CM

## 2014-06-06 DIAGNOSIS — Z8619 Personal history of other infectious and parasitic diseases: Secondary | ICD-10-CM | POA: Insufficient documentation

## 2014-06-06 DIAGNOSIS — Z8659 Personal history of other mental and behavioral disorders: Secondary | ICD-10-CM | POA: Insufficient documentation

## 2014-06-06 DIAGNOSIS — R112 Nausea with vomiting, unspecified: Secondary | ICD-10-CM | POA: Insufficient documentation

## 2014-06-06 LAB — COMPREHENSIVE METABOLIC PANEL
ALK PHOS: 137 U/L — AB (ref 39–117)
ALT: 14 U/L (ref 0–35)
AST: 24 U/L (ref 0–37)
Albumin: 4.1 g/dL (ref 3.5–5.2)
Anion gap: 7 (ref 5–15)
BILIRUBIN TOTAL: 0.5 mg/dL (ref 0.3–1.2)
BUN: 9 mg/dL (ref 6–23)
CO2: 23 mmol/L (ref 19–32)
CREATININE: 0.89 mg/dL (ref 0.50–1.10)
Calcium: 9.2 mg/dL (ref 8.4–10.5)
Chloride: 106 mmol/L (ref 96–112)
GFR calc non Af Amer: >90 mL/min (ref >90)
GLUCOSE: 115 mg/dL — AB (ref 70–99)
POTASSIUM: 3.8 mmol/L (ref 3.5–5.1)
Sodium: 136 mmol/L (ref 135–145)
Total Protein: 8.4 g/dL — ABNORMAL HIGH (ref 6.0–8.3)

## 2014-06-06 LAB — CBC WITH DIFFERENTIAL/PLATELET
BASOS ABS: 0 10*3/uL (ref 0.0–0.1)
Basophils Absolute: 0 10*3/uL (ref 0.0–0.1)
Basophils Relative: 0 % (ref 0–1)
Basophils Relative: 0 % (ref 0–1)
Eosinophils Absolute: 0.1 10*3/uL (ref 0.0–0.7)
Eosinophils Absolute: 0 10*3/uL (ref 0.0–0.7)
Eosinophils Relative: 0 % (ref 0–5)
Eosinophils Relative: 0 % (ref 0–5)
HCT: 38.9 % (ref 36.0–46.0)
HCT: 36.6 % (ref 36.0–46.0)
HEMOGLOBIN: 11.9 g/dL — AB (ref 12.0–15.0)
Hemoglobin: 12.8 g/dL (ref 12.0–15.0)
LYMPHS ABS: 0.5 10*3/uL — AB (ref 0.7–4.0)
LYMPHS PCT: 3 % — AB (ref 12–46)
Lymphocytes Relative: 6 % — ABNORMAL LOW (ref 12–46)
Lymphs Abs: 1.1 10*3/uL (ref 0.7–4.0)
MCH: 28 pg (ref 26.0–34.0)
MCH: 27.9 pg (ref 26.0–34.0)
MCHC: 32.9 g/dL (ref 30.0–36.0)
MCHC: 32.5 g/dL (ref 30.0–36.0)
MCV: 85.1 fL (ref 78.0–100.0)
MCV: 85.9 fL (ref 78.0–100.0)
MONOS PCT: 6 % (ref 3–12)
Monocytes Absolute: 1.3 10*3/uL — ABNORMAL HIGH (ref 0.1–1.0)
Monocytes Absolute: 1 10*3/uL (ref 0.1–1.0)
Monocytes Relative: 7 % (ref 3–12)
NEUTROS ABS: 17.2 10*3/uL — AB (ref 1.7–7.7)
NEUTROS PCT: 91 % — AB (ref 43–77)
Neutro Abs: 16.9 10*3/uL — ABNORMAL HIGH (ref 1.7–7.7)
Neutrophils Relative %: 87 % — ABNORMAL HIGH (ref 43–77)
PLATELETS: 283 10*3/uL (ref 150–400)
Platelets: 301 10*3/uL (ref 150–400)
RBC: 4.57 MIL/uL (ref 3.87–5.11)
RBC: 4.26 MIL/uL (ref 3.87–5.11)
RDW: 13.1 % (ref 11.5–15.5)
RDW: 13.2 % (ref 11.5–15.5)
WBC: 19.4 10*3/uL — AB (ref 4.0–10.5)
WBC: 18.8 10*3/uL — AB (ref 4.0–10.5)

## 2014-06-06 LAB — URINALYSIS, ROUTINE W REFLEX MICROSCOPIC
Bilirubin Urine: NEGATIVE
Glucose, UA: NEGATIVE mg/dL
Hgb urine dipstick: NEGATIVE
KETONES UR: NEGATIVE mg/dL
Leukocytes, UA: NEGATIVE
NITRITE: NEGATIVE
PROTEIN: NEGATIVE mg/dL
Specific Gravity, Urine: 1.025 (ref 1.005–1.030)
Urobilinogen, UA: 0.2 mg/dL (ref 0.0–1.0)
pH: 5.5 (ref 5.0–8.0)

## 2014-06-06 LAB — I-STAT BETA HCG BLOOD, ED (MC, WL, AP ONLY): I-stat hCG, quantitative: <5 m[IU]/mL (ref <5)

## 2014-06-06 LAB — LIPASE, BLOOD: Lipase: 30 U/L (ref 11–59)

## 2014-06-06 MED ORDER — MORPHINE SULFATE 4 MG/ML IJ SOLN
4.0000 mg | INTRAMUSCULAR | Status: DC | PRN
  Administered 2014-06-06: 4 mg via INTRAVENOUS
  Filled 2014-06-06: qty 1

## 2014-06-06 MED ORDER — SODIUM CHLORIDE 0.9 % IV BOLUS (SEPSIS)
500.0000 mL | Freq: Once | INTRAVENOUS | Status: AC
  Administered 2014-06-06: 500 mL via INTRAVENOUS

## 2014-06-06 MED ORDER — ONDANSETRON 4 MG PO TBDP: 4.0000 mg | ORAL_TABLET | Freq: Three times a day (TID) | ORAL | Status: DC | PRN

## 2014-06-06 MED ORDER — SODIUM CHLORIDE 0.9 % IV SOLN: INTRAVENOUS | Status: DC

## 2014-06-06 MED ORDER — IOHEXOL 300 MG/ML  SOLN
25.0000 mL | Freq: Once | INTRAMUSCULAR | Status: AC | PRN
  Administered 2014-06-06: 25 mL via ORAL

## 2014-06-06 MED ORDER — ONDANSETRON HCL 4 MG/2ML IJ SOLN
4.0000 mg | INTRAMUSCULAR | Status: DC | PRN
  Administered 2014-06-06: 4 mg via INTRAVENOUS
  Filled 2014-06-06: qty 2

## 2014-06-06 MED ORDER — IOHEXOL 300 MG/ML  SOLN
100.0000 mL | Freq: Once | INTRAMUSCULAR | Status: AC | PRN
  Administered 2014-06-06: 100 mL via INTRAVENOUS

## 2014-06-06 MED ORDER — HYDROCODONE-ACETAMINOPHEN 5-325 MG PO TABS: ORAL_TABLET | ORAL | Status: DC

## 2014-06-06 MED ORDER — FAMOTIDINE IN NACL 20-0.9 MG/50ML-% IV SOLN
20.0000 mg | Freq: Once | INTRAVENOUS | Status: AC
  Administered 2014-06-06: 20 mg via INTRAVENOUS
  Filled 2014-06-06: qty 50

## 2014-06-06 NOTE — ED Notes (Addendum)
Pt reports vomiting 3-4 times today pt reports hx of gallstones. Pt also unsure if she is pregnant states last period was 3-4 months ago

## 2014-06-06 NOTE — ED Notes (Signed)
Patient with no complaints at this time. Respirations even and unlabored. Skin warm/dry. Discharge instructions reviewed with patient at this time. Patient given opportunity to voice concerns/ask questions. IV removed per policy and band-aid applied to site. Patient discharged at this time and left Emergency Department with steady gait.  

## 2014-06-06 NOTE — ED Notes (Signed)
Istat HCG <5

## 2014-06-06 NOTE — ED Provider Notes (Signed)
CSN: [id number]     Arrival date & time 06/06/14  1130 History   First MD Initiated Contact with Patient 06/06/14 1142     Chief Complaint  Patient presents with  . Emesis      HPI Pt was seen at 1150. Per pt, c/o gradual onset and persistence of multiple intermittent episodes of N/V that began when she woke up this morning. Last meal was last night. Describes the emesis as "yellow." Has been associated with upper abd "pain."  Pt also requesting pregnancy test due to hx "irregular periods."  Denies CP/SOB, no back pain, no fevers, no black or blood in stools or emesis. The symptoms have been associated with no other complaints. The patient has a significant history of similar symptoms previously, recently being evaluated for this complaint and several prior evals for same. Pt has hx of gallstones but has not f/u with General Surgeon as previously instructed.     Past Medical History  Diagnosis Date  . PCOS (polycystic ovarian syndrome)   . Chicken pox   . Anxiety   . Gallstones    History reviewed. No pertinent past surgical history.   Family History  Problem Relation Age of Onset  . Diabetes Paternal Grandmother    History  Substance Use Topics  . Smoking status: Never Smoker   . Smokeless tobacco: Never Used  . Alcohol Use: No    Review of Systems ROS: Statement: All systems negative except as marked or noted in the HPI; Constitutional: Negative for fever and chills. ; ; Eyes: Negative for eye pain, redness and discharge. ; ; ENMT: Negative for ear pain, hoarseness, nasal congestion, sinus pressure and sore throat. ; ; Cardiovascular: Negative for chest pain, palpitations, diaphoresis, dyspnea and peripheral edema. ; ; Respiratory: Negative for cough, wheezing and stridor. ; ; Gastrointestinal: +N/V, abd pain. Negative for diarrhea, blood in stool, hematemesis, jaundice and rectal bleeding. . ; ; Genitourinary: Negative for dysuria, flank pain and hematuria. ; ; Musculoskeletal:  Negative for back pain and neck pain. Negative for swelling and trauma.; ; Skin: Negative for pruritus, rash, abrasions, blisters, bruising and skin lesion.; ; Neuro: Negative for headache, lightheadedness and neck stiffness. Negative for weakness, altered level of consciousness , altered mental status, extremity weakness, paresthesias, involuntary movement, seizure and syncope.      Allergies  Review of patient's allergies indicates no known allergies.  Home Medications   Prior to Admission medications   Medication Sig Start Date End Date Taking? Authorizing Provider  ibuprofen (ADVIL,MOTRIN) 200 MG tablet Take 800 mg by mouth every 6 (six) hours as needed for headache, mild pain or moderate pain.    Historical Provider, MD  medroxyPROGESTERone (PROVERA) 10 MG tablet Take 1 tablet (10 mg total) by mouth daily. One tablet daily x 2 weeks. Repeat as directed 04/07/14   Tilda Burrow, MD  megestrol (MEGACE) 40 MG tablet Take 1 tablet (40 mg total) by mouth 3 (three) times daily. Patient not taking: Reported on 03/15/2014 12/18/13   Tilda Burrow, MD  oxyCODONE-acetaminophen (PERCOCET) 5-325 MG per tablet Take 2 tablets by mouth every 4 (four) hours as needed. 05/11/14   Donnetta Hutching, MD  promethazine (PHENERGAN) 25 MG tablet Take 1 tablet (25 mg total) by mouth every 6 (six) hours as needed. 05/11/14   Donnetta Hutching, MD   BP 106/77 mmHg  Pulse 86  Temp(Src) 97.9 F (36.6 C) (Oral)  Resp 18  Ht  (1.727 m)  Wt 323 lb (  146.512 kg)  BMI 49.12 kg/m2  SpO2 100% Physical Exam  1155: Physical examination:  Nursing notes reviewed; Vital signs and O2 SAT reviewed;  Constitutional: Well developed, Well nourished, Well hydrated, In no acute distress; Head:  Normocephalic, atraumatic; Eyes: EOMI, PERRL, No scleral icterus; ENMT: Mouth and pharynx normal, Mucous membranes moist; Neck: Supple, Full range of motion, No lymphadenopathy; Cardiovascular: Regular rate and rhythm, No murmur, rub, or gallop;  Respiratory: Breath sounds clear & equal bilaterally, No rales, rhonchi, wheezes.  Speaking full sentences with ease, Normal respiratory effort/excursion; Chest: Nontender, Movement normal; Abdomen: Soft, +mid-epigastric tenderness to palp. No rebound or guarding. Nondistended, Normal bowel sounds; Genitourinary: No CVA tenderness; Extremities: Pulses normal, No tenderness, No edema, No calf edema or asymmetry.; Neuro: AA&Ox3, Major CN grossly intact.  Speech clear. No gross focal motor or sensory deficits in extremities.; Skin: Color normal, Warm, Dry.   ED Course  Procedures     EKG Interpretation None      MDM  MDM Reviewed: previous chart, nursing note and vitals Reviewed previous: labs and ultrasound Interpretation: labs, x-ray and CT scan     Results for orders placed or performed during the hospital encounter of 06/06/14  Urinalysis, Routine w reflex microscopic  Result Value Ref Range   Color, Urine YELLOW YELLOW   APPearance HAZY (A) CLEAR   Specific Gravity, Urine 1.025 1.005 - 1.030   pH 5.5 5.0 - 8.0   Glucose, UA NEGATIVE NEGATIVE mg/dL   Hgb urine dipstick NEGATIVE NEGATIVE   Bilirubin Urine NEGATIVE NEGATIVE   Ketones, ur NEGATIVE NEGATIVE mg/dL   Protein, ur NEGATIVE NEGATIVE mg/dL   Urobilinogen, UA 0.2 0.0 - 1.0 mg/dL   Nitrite NEGATIVE NEGATIVE   Leukocytes, UA NEGATIVE NEGATIVE  Comprehensive metabolic panel  Result Value Ref Range   Sodium 136 135 - 145 mmol/L   Potassium 3.8 3.5 - 5.1 mmol/L   Chloride 106 96 - 112 mmol/L   CO2 23 19 - 32 mmol/L   Glucose, Bld 115 (H) 70 - 99 mg/dL   BUN 9 6 - 23 mg/dL   Creatinine, Ser 4.09 0.50 - 1.10 mg/dL   Calcium 9.2 8.4 - 81.1 mg/dL   Total Protein 8.4 (H) 6.0 - 8.3 g/dL   Albumin 4.1 3.5 - 5.2 g/dL   AST 24 0 - 37 U/L   ALT 14 0 - 35 U/L   Alkaline Phosphatase 137 (H) 39 - 117 U/L   Total Bilirubin 0.5 0.3 - 1.2 mg/dL   GFR calc non Af Amer >90 >90 mL/min   GFR calc Af Amer >90 >90 mL/min   Anion gap  7 5 - 15  Lipase, blood  Result Value Ref Range   Lipase 30 11 - 59 U/L  CBC with Differential  Result Value Ref Range   WBC 19.4 (H) 4.0 - 10.5 K/uL   RBC 4.57 3.87 - 5.11 MIL/uL   Hemoglobin 12.8 12.0 - 15.0 g/dL   HCT 91.4 78.2 - 95.6 %   MCV 85.1 78.0 - 100.0 fL   MCH 28.0 26.0 - 34.0 pg   MCHC 32.9 30.0 - 36.0 g/dL   RDW 21.3 08.6 - 57.8 %   Platelets 301 150 - 400 K/uL   Neutrophils Relative % 87 (H) 43 - 77 %   Neutro Abs 16.9 (H) 1.7 - 7.7 K/uL   Lymphocytes Relative 6 (L) 12 - 46 %   Lymphs Abs 1.1 0.7 - 4.0 K/uL   Monocytes Relative 7 3 -  12 %   Monocytes Absolute 1.3 (H) 0.1 - 1.0 K/uL   Eosinophils Relative 0 0 - 5 %   Eosinophils Absolute 0.1 0.0 - 0.7 K/uL   Basophils Relative 0 0 - 1 %   Basophils Absolute 0.0 0.0 - 0.1 K/uL  CBC with Differential  Result Value Ref Range   WBC 18.8 (H) 4.0 - 10.5 K/uL   RBC 4.26 3.87 - 5.11 MIL/uL   Hemoglobin 11.9 (L) 12.0 - 15.0 g/dL   HCT 16.136.6 09.636.0 - 04.546.0 %   MCV 85.9 78.0 - 100.0 fL   MCH 27.9 26.0 - 34.0 pg   MCHC 32.5 30.0 - 36.0 g/dL   RDW 40.913.2 81.111.5 - 91.415.5 %   Platelets 283 150 - 400 K/uL   Neutrophils Relative % 91 (H) 43 - 77 %   Neutro Abs 17.2 (H) 1.7 - 7.7 K/uL   Lymphocytes Relative 3 (L) 12 - 46 %   Lymphs Abs 0.5 (L) 0.7 - 4.0 K/uL   Monocytes Relative 6 3 - 12 %   Monocytes Absolute 1.0 0.1 - 1.0 K/uL   Eosinophils Relative 0 0 - 5 %   Eosinophils Absolute 0.0 0.0 - 0.7 K/uL   Basophils Relative 0 0 - 1 %   Basophils Absolute 0.0 0.0 - 0.1 K/uL  I-Stat beta hCG blood, ED  Result Value Ref Range   I-stat hCG, quantitative <5.0 <5 mIU/mL   Comment 3           [place] Abdomen Complete 05/13/2014   CLINICAL DATA:  2-3 days history of right upper quadrant and epigastric pain; 1 year history of similar intermittent symptoms; history of morbid obesity ; known gallstones on ultrasound performed in the axial approximately 1 year ago.  EXAM: ULTRASOUND ABDOMEN COMPLETE  COMPARISON:  None.  FINDINGS: Gallbladder:  There are multiple mobile shadowing stones. The largest measures at least 3 cm in diameter. The gallbladder wall measures 3.1 mm. There is no pericholecystic fluid or positive sonographic Murphy's sign.  Common bile duct: Diameter: 2.9 mm. Visualization was limited by bowel gas.  Liver: No focal lesion identified. Within normal limits in parenchymal echogenicity. There is no intrahepatic ductal dilation.  IVC: No abnormality visualized.  Pancreas: Evaluation of the pancreatic head and tail was limited by bowel gas. The pancreatic body is normal where visualized.  Spleen: Size and appearance within normal limits.  Right Kidney: Length: 10.2 cm. Echogenicity within normal limits. No mass or hydronephrosis visualized.  Left Kidney: Length: A 11.3 cm. Echogenicity within normal limits. No mass or hydronephrosis visualized.  Abdominal aorta: Evaluation of the abdominal aorta was limited by bowel gas.  Other findings: None.  IMPRESSION: 1. Cholelithiasis without evidence of acute cholecystitis. 2. Limited visualization of the pancreas and abdominal aorta. 3. No acute abnormality of the liver, spleen, or kidneys.   Electronically Signed   By: David  [place]   On: 05/13/2014 11:03   Ct Abdomen Pelvis W Contrast 06/06/2014   CLINICAL DATA:  Abdominal pain and vomiting. Known cholelithiasis. Leukocytosis.  EXAM: CT ABDOMEN AND PELVIS WITH CONTRAST  TECHNIQUE: Multidetector CT imaging of the abdomen and pelvis was performed using the standard protocol following bolus administration of intravenous contrast.  CONTRAST:  25mL OMNIPAQUE IOHEXOL 300 MG/ML SOLN, 100mL OMNIPAQUE IOHEXOL 300 MG/ML SOLN  COMPARISON:  Radiographs obtained earlier today. Ultrasound dated 05/13/2014.  FINDINGS: Small low-density gallstones in the gallbladder. The previously demonstrated larger gallstones are not visible by CT today. No gallbladder wall thickening  or pericholecystic fluid.  Normal appearing liver, spleen, pancreas, adrenal glands, kidneys  and urinary bladder. Unremarkable uterus and ovaries. No gastrointestinal abnormalities or enlarged lymph nodes. No evidence of appendicitis. Clear lung bases. Unremarkable bones.  IMPRESSION: 1. No acute abnormality. 2. Cholelithiasis.   Electronically Signed   By: Beckie Salts M.D.   On: 06/06/2014 14:15   Dg Abd Acute W/chest 06/06/2014   CLINICAL DATA:  VOMITING, STARTED TODAY 3-4 TIMES. PATIENT STATES " SHE IS HAVING ISSUES WITH HER GALLBLADDER" HISTORY OF PCOS, GALLSTONES  EXAM: DG ABDOMEN ACUTE W/ 1V CHEST  COMPARISON:  07/30/2012  FINDINGS: Normal bowel gas pattern. No free air. No evidence of renal or ureteral stones. Normal soft tissues.  Normal heart, mediastinum hila.  Clear lungs.  IMPRESSION: Negative abdominal radiographs.  No acute cardiopulmonary disease.   Electronically Signed   By: Amie Portland M.D.   On: 06/06/2014 13:43    1435:  WBC count trending downward after IVF and pain/nausea control. No acute surgical issue at this time. Pt has tol PO well while in the ED without N/V.  No stooling while in the ED.  Abd benign, VSS. Feels better and wants to go home now.  Dx and testing d/w pt.  Questions answered.  Verb understanding, agreeable to d/c home with outpt f/u with General Surgeon.     Samuel Jester, DO 06/07/14 2120

## 2014-06-06 NOTE — Discharge Instructions (Signed)
°Emergency Department Resource Guide °1) Find a Doctor and Pay Out of Pocket °Although you won't have to find out who is covered by your insurance plan, it is a good idea to ask around and get recommendations. You will then need to call the office and see if the doctor you have chosen will accept you as a new patient and what types of options they offer for patients who are self-pay. Some doctors offer discounts or will set up payment plans for their patients who do not have insurance, but you will need to ask so you aren't surprised when you get to your appointment. ° °2) Contact Your Local Health Department °Not all health departments have doctors that can see patients for sick visits, but many do, so it is worth a call to see if yours does. If you don't know where your local health department is, you can check in your phone book. The CDC also has a tool to help you locate your state's health department, and many state websites also have listings of all of their local health departments. ° °3) Find a Walk-in Clinic °If your illness is not likely to be very severe or complicated, you may want to try a walk in clinic. These are popping up all over the country in pharmacies, drugstores, and shopping centers. They're usually staffed by nurse practitioners or physician assistants that have been trained to treat common illnesses and complaints. They're usually fairly quick and inexpensive. However, if you have serious medical issues or chronic medical problems, these are probably not your best option. ° °No Primary Care Doctor: °- Call [facility] at  832-8000 - they can help you locate a primary care doctor that  accepts your insurance, provides certain services, etc. °- Physician Referral Service- 1-800-533-3463 ° °Chronic Pain Problems: °Organization         Address  Phone   Notes  °[facility]  (336) 297-2271 Patients need to be referred by their primary care doctor.  ° °Medication  Assistance: °Organization         Address  Phone   Notes  °[place] [facility] [address] °[place], [place] [address] (336) 641-8030 --Must be a resident of [place] °-- Must have NO insurance coverage whatsoever (no Medicaid/ Medicare, etc.) °-- The pt. MUST have a primary care doctor that directs their care regularly and follows them in the community °  °[facility]  (866) 331-1348   °[employer]  (888) 892-1162   ° °Agencies that provide inexpensive medical care: °Organization         Address  Phone   Notes  °[facility]  (336) 832-8035   °[facility]    (336) 832-7272   °[facility] [address] °[place], [place] [address] (336) 832-4777   °[facility] [address], °[place] (336) 271-4999   °[facility]    (336) 373-0678   °[facility]    (336) 272-1050   °[facility] ° [address], [place] Phone:  (336) 832-4444, Fax:  (336) 832-4440 Hours of Operation:  9 am - 6 pm, M-F.  Also accepts Medicaid/Medicare and self-pay.  °[facility] ° [address], [place] Phone: (336) 832-3150, Fax: (336) 832-3151. Hours of Operation:  8:30 am - 5:30 pm, M-F.  Also accepts Medicaid and self-pay.  °[facility] [place] [address], [place] Phone: (336) 878-6027   °[facility] [address], [place], [place] (336)723-1848, Ext. 123 Mondays & Thursdays: 7-9 AM.  First 15 patients are seen on a first come, first serve basis. °  ° °Medicaid-accepting [place] Providers: ° °Organization         Address  Phone   Notes  °[facility] [address], [place] (336) 641-2100 Also accepts self-pay patients.  °[facility] [address], [place] ° (336) 856-9996   °[facility] [address], [place]  (336) 288-8857   °[facility] [address] [place] [address], [place] (336) 299-7000   °Veita Bland [address], [place]  ° (336) 373-1557 Only accepts [place] Access Medicaid patients after they have their name applied to their card.  ° °Self-Pay (no insurance) in [place]: ° °Organization         Address  Phone   Notes  °Sickle Cell Patients, [facility] [address], [place] (336) 832-1970   °[facility] [address], [place] (336) 832-4400   °[facility] ° [address], [place] (336) 992-4800   °[facility]/Dr. Osei-Bonsu ° [address] [place] [address], [place] or [address], [place] (336) 841-8500 Phone number for both [place] and [place] locations is the same.  °[facility] [address], [place] (336) 299-0000   °[facility] [address] [place] [address], [place] or [address] (336) 852-7530 °(336) 878-2260   °[facility] [address], [place] (336) 350-1642, phone; (336) 294-5005, fax Sees patients 1st and 3rd Saturday of every month.  Must not qualify for public or private insurance (i.e. Medicaid, Medicare, [place] Health Choice, Veterans' Benefits) • Household income should be no more than 200% of the poverty level •The clinic cannot treat you if you are pregnant or think you are pregnant • Sexually transmitted diseases are not treated at the clinic.  ° ° °Dental Care: °Organization         Address  Phone  Notes  °[place] [facility] [address], [place] (336) 641-6152 Accepts children up to age 21 who are enrolled in Medicaid or [place] Health Choice; pregnant women with a Medicaid card; and children who have applied for Medicaid or [place] Health Choice, but were declined, whose parents can pay a reduced fee at time of service.  °[place]  [facility] [place]  [address], [place] (336) 641-7733 Accepts children up to age 21 who are enrolled in Medicaid or [place] Health Choice; pregnant women with a Medicaid card; and children who have applied for Medicaid or [place] Health Choice, but were declined, whose parents can pay a reduced fee at time of service.  °[facility] ° [address], [place] (336) 641-4533 Patients are seen by appointment only. Walk-ins are not accepted. [facility] will see patients 18 years of age and older. °Monday - Tuesday (8am-5pm) °Most Wednesdays (8:30-5pm) °$30 per visit, cash only  °[facility] ° [address], [place] (336) 641-4533 Patients are seen by appointment only. Walk-ins are not accepted. [facility] will see patients 18 years of age and older. °One   Wednesday Evening (Monthly: Volunteer Based).  $30 per visit, cash only  °[facility]  (919) 537-3737 for adults; Children under age 4, call Graduate Pediatric Dentistry at ([phone]) [phone]. Children aged 4-14, please call (919) [phone] to request a pediatric application. ° Dental services are provided in all areas of dental care including fillings, crowns and bridges, complete and partial dentures, implants, gum treatment, root canals, and extractions. Preventive care is also provided. Treatment is provided to both adults and children. °Patients are selected via a lottery and there is often a waiting list. °  °[facility] [address], °[place] ° (336) 763-8833 [email or web address] °  °[facility] [address], [place], [place] (336)723-1848, Ext. 123 Second and Fourth Thursday of each month, opens at 6:30 AM; Clinic ends at 9 AM.  Patients are seen on a first-come first-served basis, and a limited number are seen during each clinic.  ° °[facility] ° [address], [place], [place] (336) 723-7904    Eligibility Requirements °You must have lived in [place], [place], or [place] counties for at least the last three months. °  You cannot be eligible for state or federal sponsored healthcare insurance, including Veterans Administration, Medicaid, or Medicare. °  You generally cannot be eligible for healthcare insurance through your employer.  °  How to apply: °Eligibility screenings are held every Tuesday and Wednesday afternoon from 1:00 pm until 4:00 pm. You do not need an appointment for the interview!  °[facility] [address], [place], [place] 336-631-2330   °[facility]  336-342-8273   °[facility]  336-703-3100   °[facility]  336-570-6415   ° °Behavioral Health Resources in the Community: °Intensive Outpatient Programs °Organization         Address  Phone  Notes  °[facility] [address], [place], [place] 336-878-6098   °[facility] [address], [place], [place] 336-832-9800   °[facility] [address], [place], [place] ° 336-882-2125   °[facility] [address],  °[place], [place] 1-800-853-5163 or 336-641-4981   °Substance Abuse Resources °Organization         Address  Phone  Notes  °[facility]  336-882-2125   °[facility]  336-784-9470   °[facility]  336-285-9073   °[facility]  336-845-3988   °Residential & Outpatient Substance Abuse Program  1-800-659-3381   °Psychological Services °Organization         Address  Phone  Notes  °[facility]  336- 832-9600   °[facility]  336- 378-7881   °[facility] [address], [place] 1-800-853-5163 or 336-641-4981   ° °Mobile Crisis Teams °Organization         Address  Phone  Notes  °[facility], Mobile Crisis Care Unit  1-877-626-1772   °Assertive °[facility] ° [address]  [place], [place] 336-834-9664   °Sharon DeEsch [address] °[place] 336-554-5454   ° °Self-Help/Support Groups °Organization         Address  Phone             Notes  °[facility] of  - variety of support groups  336- 373-1402 Call for more information  °Narcotics Anonymous (NA), [facility] [address], °[place]  2 meetings at this location  ° °  Residential Treatment Programs Organization         Address  Phone  Notes  [facility] [address],    [place]  [phone]   [facility]  [address], [place] [phone], [place], [place] [phone]   [facility] [address], [place] [phone] Admissions: 8am-3pm M-F  [facility] [address]    [place], [place] 366-440-3474([phone])[phone]   [facility] [address], [place], [place] [phone]   [facility] [address]  [place], [place] [phone]   [facility] Intensive Outpatient [address], [place], [place] [phone]   [facility] [address]  [place], [place] [phone] or [phone]   [facility] [address] [place], [place] [phone] Accepts Medicaid  [facility] [address]  [place] [phone] Substance Abuse/Addiction Treatment   [facility]         Address  Phone  Notes  [facility]  215-097-0422(888) [phone]   Angie FavaJulie Brannon, PhD [address] [place], [place]   [phone](336) [phone] or [phone](336) [phone]   [facility]   [address] [place], [place] ([phone](336) [phone]   [facility] [address], [place], [place] [phone](336) ([phone])[phone] Insurance/Medicaid/sponsorship through [facility] [address]                                    [place], [place] [phone](336) ([phone] Therapy/tele-psych/case    [facility] [address]   [place], [place] [phone](336) [phone]    Dr. Lolly MustacheArfeen  [phone](336) [phone]   [facility]  [facility] 1) [address], [place] 2) [address], [place] 3)  [address], [place] [phone](336) [phone](336) [phone]  [phone](336) [phone]   [facility] Child Abuse Hotline (530)093-3603(336) 321-130-8249 or 531-391-3961(336) 816-397-1028 (After Hours)      Eat a bland diet, avoiding greasy, fatty, fried foods, as well as spicy and acidic foods or beverages.  Avoid eating within the hour or 2 before going to bed or laying down.  Also avoid teas, colas, coffee, chocolate, pepermint and spearment.  Take over the counter pepcid, one tablet by mouth twice a day, for the next 2 to 3 weeks.  May also take over the counter maalox/mylanta, as directed on packaging, as needed for discomfort.  Take the prescriptions as directed.  Call the General Surgeon and the GI doctor tomorrow to schedule a follow up appointment within the next week.  Return to the Emergency Department immediately if worsening.

## 2014-11-10 ENCOUNTER — Telehealth: Payer: Self-pay | Admitting: Obstetrics & Gynecology

## 2014-11-11 NOTE — Telephone Encounter (Signed)
Pt requesting refill on the Megace 40 mg for period management. Last office visit with Dr. Emelda Fear on 04/07/2014. Pt aware Dr. Emelda Fear out of the office until Monday, November 15, 2014.

## 2014-12-07 ENCOUNTER — Telehealth: Payer: Self-pay | Admitting: Obstetrics and Gynecology

## 2014-12-07 NOTE — Telephone Encounter (Signed)
Pt states that she was taking megace and was unable to continue taking them after the refill. Pt states that she is bleeding and needs a refill on her megace. PT was last seen this April.

## 2014-12-07 NOTE — Telephone Encounter (Signed)
Needs appt

## 2014-12-16 ENCOUNTER — Ambulatory Visit: Payer: Medicaid Other | Admitting: Obstetrics and Gynecology

## 2014-12-22 ENCOUNTER — Ambulatory Visit: Payer: Medicaid Other | Admitting: Obstetrics and Gynecology

## 2014-12-29 ENCOUNTER — Ambulatory Visit: Payer: Medicaid Other | Admitting: Obstetrics and Gynecology

## 2015-01-06 ENCOUNTER — Ambulatory Visit: Payer: Medicaid Other | Admitting: Obstetrics & Gynecology

## 2015-01-07 ENCOUNTER — Ambulatory Visit: Payer: Medicaid Other | Admitting: Obstetrics & Gynecology

## 2015-01-17 ENCOUNTER — Ambulatory Visit: Payer: Medicaid Other | Admitting: Obstetrics and Gynecology

## 2015-04-15 ENCOUNTER — Other Ambulatory Visit: Payer: Medicaid Other | Admitting: Obstetrics and Gynecology

## 2015-04-21 ENCOUNTER — Other Ambulatory Visit: Payer: Medicaid Other | Admitting: Obstetrics and Gynecology

## 2015-04-21 ENCOUNTER — Encounter: Payer: Self-pay | Admitting: Obstetrics and Gynecology

## 2015-05-12 ENCOUNTER — Emergency Department (HOSPITAL_COMMUNITY)
Admission: EM | Admit: 2015-05-12 | Discharge: 2015-05-13 | Disposition: A | Discharge status: Home / Self Care | Payer: Medicaid Other | Attending: Emergency Medicine | Admitting: Emergency Medicine

## 2015-05-12 ENCOUNTER — Encounter (HOSPITAL_COMMUNITY): Payer: Self-pay

## 2015-05-12 DIAGNOSIS — K8020 Calculus of gallbladder without cholecystitis without obstruction: Secondary | ICD-10-CM | POA: Insufficient documentation

## 2015-05-12 DIAGNOSIS — K5900 Constipation, unspecified: Secondary | ICD-10-CM | POA: Insufficient documentation

## 2015-05-12 DIAGNOSIS — D72829 Elevated white blood cell count, unspecified: Secondary | ICD-10-CM | POA: Insufficient documentation

## 2015-05-12 LAB — URINALYSIS, ROUTINE W REFLEX MICROSCOPIC
BILIRUBIN URINE: NEGATIVE
Glucose, UA: NEGATIVE mg/dL
Hgb urine dipstick: NEGATIVE
Ketones, ur: NEGATIVE mg/dL
Leukocytes, UA: NEGATIVE
Nitrite: NEGATIVE
PH: 7 (ref 5.0–8.0)
PROTEIN: NEGATIVE mg/dL
Specific Gravity, Urine: 1.02 (ref 1.005–1.030)

## 2015-05-12 LAB — COMPREHENSIVE METABOLIC PANEL
ALBUMIN: 3.9 g/dL (ref 3.5–5.0)
ALT: 12 U/L — AB (ref 14–54)
AST: 23 U/L (ref 15–41)
Alkaline Phosphatase: 104 U/L (ref 38–126)
Anion gap: 6 (ref 5–15)
BUN: 11 mg/dL (ref 6–20)
CHLORIDE: 105 mmol/L (ref 101–111)
CO2: 25 mmol/L (ref 22–32)
Calcium: 8.5 mg/dL — ABNORMAL LOW (ref 8.9–10.3)
Creatinine, Ser: 0.87 mg/dL (ref 0.44–1.00)
GFR calc Af Amer: >60 mL/min (ref >60)
GLUCOSE: 93 mg/dL (ref 65–99)
Potassium: 3.5 mmol/L (ref 3.5–5.1)
Sodium: 136 mmol/L (ref 135–145)
Total Bilirubin: 0.4 mg/dL (ref 0.3–1.2)
Total Protein: 7.9 g/dL (ref 6.5–8.1)

## 2015-05-12 LAB — POC URINE PREG, ED: Preg Test, Ur: NEGATIVE

## 2015-05-12 LAB — CBC
HCT: 37.3 % (ref 36.0–46.0)
Hemoglobin: 12.5 g/dL (ref 12.0–15.0)
MCH: 28.9 pg (ref 26.0–34.0)
MCHC: 33.5 g/dL (ref 30.0–36.0)
MCV: 86.3 fL (ref 78.0–100.0)
PLATELETS: 292 10*3/uL (ref 150–400)
RBC: 4.32 MIL/uL (ref 3.87–5.11)
RDW: 13.4 % (ref 11.5–15.5)
WBC: 12.4 10*3/uL — AB (ref 4.0–10.5)

## 2015-05-12 LAB — LIPASE, BLOOD: Lipase: 49 U/L (ref 11–51)

## 2015-05-12 MED ORDER — SODIUM CHLORIDE 0.9 % IV BOLUS (SEPSIS)
1000.0000 mL | Freq: Once | INTRAVENOUS | Status: AC
Start: 2015-05-13 — End: 2015-05-13
  Administered 2015-05-13: 1000 mL via INTRAVENOUS

## 2015-05-12 MED ORDER — ONDANSETRON HCL 4 MG/2ML IJ SOLN
4.0000 mg | Freq: Once | INTRAMUSCULAR | Status: AC
  Administered 2015-05-13: 4 mg via INTRAVENOUS
  Filled 2015-05-12: qty 2

## 2015-05-12 MED ORDER — MORPHINE SULFATE (PF) 4 MG/ML IV SOLN
4.0000 mg | Freq: Once | INTRAVENOUS | Status: AC
  Administered 2015-05-13: 4 mg via INTRAVENOUS
  Filled 2015-05-12: qty 1

## 2015-05-12 NOTE — ED Notes (Signed)
Patient states upper abdominal pain starting last night. Patient states she has a history of Gallbladder problems, and it started hurting again after she ate last night.

## 2015-05-12 NOTE — ED Notes (Signed)
Having a gall bladder attack, and also having back pain per pt.  Had nausea and vomiting today.

## 2015-05-12 NOTE — ED Provider Notes (Signed)
TIME SEEN: 11:45 PM  CHIEF COMPLAINT: Abdominal pain  HPI:  HPI Comments: Katie Roberson is a 25 y.o. female, with a h/o cholelithiasis, who presents to the Emergency Department complaining of sudden onset, constant, moderate RUQ abdominal pain s/p eating pizza last night. She attributes her current pain to her known diagnosis of gallstones and has experienced this same pain in the past. Pt states her pain associated with cholelithiasis has been occurring more frequently. She associates nausea, vomiting, and constipation. Last normal bowel movement was last night. She has been evaluated by Dr. Lovell SheehanJenkins in the past for cholelithiasis and they have discussed removing her gallbladder but she has not elected this surgery as of yet. She is unsure of a chance of pregnancy and states her LNMP was 'a while ago'. Denies vaginal bleeding or discharge.   ROS: See HPI Constitutional: no fever  Eyes: no drainage  ENT: no runny nose   Cardiovascular:  no chest pain  Resp: no SOB  GI:  vomiting GU: no dysuria Integumentary: no rash  Allergy: no hives  Musculoskeletal: no leg swelling  Neurological: no slurred speech ROS otherwise negative  PAST MEDICAL HISTORY/PAST SURGICAL HISTORY:  Past Medical History  Diagnosis Date  . PCOS (polycystic ovarian syndrome)   . Chicken pox   . Anxiety   . Gallstones     MEDICATIONS:  Prior to Admission medications   Not on File    ALLERGIES:  No Known Allergies  SOCIAL HISTORY:  Social History  Substance Use Topics  . Smoking status: Never Smoker   . Smokeless tobacco: Never Used  . Alcohol Use: No    FAMILY HISTORY: Family History  Problem Relation Age of Onset  . Diabetes Paternal Grandmother     EXAM: BP 119/90 mmHg  Pulse 62  Temp(Src) 98.8 F (37.1 C) (Oral)  Resp 20  Ht 5\' 8"  (1.727 m)  Wt 310 lb (140.615 kg)  BMI 47.15 kg/m2  SpO2 100% CONSTITUTIONAL: Alert and oriented and responds appropriately to questions.  Well-appearing; well-nourished, obese, afebrile, nontoxic HEAD: Normocephalic EYES: Conjunctivae clear, PERRL ENT: normal nose; no rhinorrhea; moist mucous membranes NECK: Supple, no meningismus, no LAD  CARD: RRR; S1 and S2 appreciated; no murmurs, no clicks, no rubs, no gallops RESP: Normal chest excursion without splinting or tachypnea; breath sounds clear and equal bilaterally; no wheezes, no rhonchi, no rales, no hypoxia or respiratory distress, speaking full sentences ABD/GI: Normal bowel sounds; non-distended; soft, tender in epigastric region and RUQ, negative Murphy's sign, no rebound, no guarding, no peritoneal signs; no tenderness at McBurney's point BACK:  The back appears normal and is non-tender to palpation, there is no CVA tenderness EXT: Normal ROM in all joints; non-tender to palpation; no edema; normal capillary refill; no cyanosis, no calf tenderness or swelling    SKIN: Normal color for age and race; warm; no rash NEURO: Moves all extremities equally, sensation to light touch intact diffusely, cranial nerves II through XII intact PSYCH: The patient's mood and manner are appropriate. Grooming and personal hygiene are appropriate.  MEDICAL DECISION MAKING: Patient here with complaints of biliary colic. We'll obtain labs, urine. Will give IV fluids, pain and nausea medicine.  Doubt choledocholithiasis or cholecystitis.  ED PROGRESS: Patient's labs show mild leukocytosis which appears chronic for patient. LFTs, lipase normal. She is not pregnant. Pain well controlled after one dose of IV morphine. She is not vomiting. Have given her outpatient general surgery follow-up information here in [place] as well as [place]. I  do not feel she needs emergent abdominal imaging. She has had ultrasound, CT scan the past that confirmed cholelithiasis. Discussed return precautions. She verbalizes understanding and is comfortable with this plan.   I personally performed the services  described in this documentation, which was scribed in my presence. The recorded information has been reviewed and is accurate.    Layla Maw Ward, DO 05/13/15 [phone]

## 2015-05-13 MED ORDER — ONDANSETRON 4 MG PO TBDP: 4.0000 mg | ORAL_TABLET | Freq: Three times a day (TID) | ORAL | Status: DC | PRN

## 2015-05-13 MED ORDER — OXYCODONE-ACETAMINOPHEN 5-325 MG PO TABS: 1.0000 | ORAL_TABLET | Freq: Four times a day (QID) | ORAL | Status: DC | PRN

## 2015-05-13 NOTE — Discharge Instructions (Signed)
Cholelithiasis °Cholelithiasis (also called gallstones) is a form of gallbladder disease in which gallstones form in your gallbladder. The gallbladder is an organ that stores bile made in the liver, which helps digest fats. Gallstones begin as small crystals and slowly grow into stones. Gallstone pain occurs when the gallbladder spasms and a gallstone is blocking the duct. Pain can also occur when a stone passes out of the duct.  °RISK FACTORS °· Being female.   °· Having multiple pregnancies. Health care providers sometimes advise removing diseased gallbladders before future pregnancies.   °· Being obese. °· Eating a diet heavy in fried foods and fat.   °· Being older than 60 years and increasing age.   °· Prolonged use of medicines containing female hormones.   °· Having diabetes mellitus.   °· Rapidly losing weight.   °· Having a family history of gallstones (heredity).   °SYMPTOMS °· Nausea.   °· Vomiting. °· Abdominal pain.   °· Yellowing of the skin (jaundice).   °· Sudden pain. It may persist from several minutes to several hours. °· Fever.   °· Tenderness to the touch.  °In some cases, when gallstones do not move into the bile duct, people have no pain or symptoms. These are called "silent" gallstones.  °TREATMENT °Silent gallstones do not need treatment. In severe cases, emergency surgery may be required. Options for treatment include: °· Surgery to remove the gallbladder. This is the most common treatment. °· Medicines. These do not always work and may take 6-12 months or more to work. °· Shock wave treatment (extracorporeal biliary lithotripsy). In this treatment an ultrasound machine sends shock waves to the gallbladder to break gallstones into smaller pieces that can pass into the intestines or be dissolved by medicine. °HOME CARE INSTRUCTIONS  °· Only take over-the-counter or prescription medicines for pain, discomfort, or fever as directed by your health care provider.   °· Follow a low-fat diet until  seen again by your health care provider. Fat causes the gallbladder to contract, which can result in pain.   °· Follow up with your health care provider as directed. Attacks are almost always recurrent and surgery is usually required for permanent treatment.   °SEEK IMMEDIATE MEDICAL CARE IF:  °· Your pain increases and is not controlled by medicines.   °· You have a fever or persistent symptoms for more than 2-3 days.   °· You have a fever and your symptoms suddenly get worse.   °· You have persistent nausea and vomiting.   °MAKE SURE YOU:  °· Understand these instructions. °· Will watch your condition. °· Will get help right away if you are not doing well or get worse. °  °This information is not intended to replace advice given to you by your health care provider. Make sure you discuss any questions you have with your health care provider. °  °Document Released: 02/01/2005 Document Revised: 10/08/2012 Document Reviewed: 07/30/2012 °Elsevier Interactive Patient Education ©2016 [employer] °Low-Fat Diet for Pancreatitis or Gallbladder Conditions °A low-fat diet can be helpful if you have pancreatitis or a gallbladder condition. With these conditions, your pancreas and gallbladder have trouble digesting fats. A healthy eating plan with less fat will help rest your pancreas and gallbladder and reduce your symptoms. °WHAT DO I NEED TO KNOW ABOUT THIS DIET? °· Eat a low-fat diet. °¨ Reduce your fat intake to less than 20-30% of your total daily calories. This is less than 50-60 g of fat per day. °¨ Remember that you need some fat in your diet. Ask your dietician what your daily goal should be. °¨ Choose   nonfat and low-fat healthy foods. Look for the words "nonfat," "low fat," or "fat free." °¨ As a guide, look on the label and choose foods with less than 3 g of fat per serving. Eat only one serving. °· Avoid alcohol. °· Do not smoke. If you need help quitting, talk with your health care provider. °· Eat small  frequent meals instead of three large heavy meals. °WHAT FOODS CAN I EAT? °Grains °Include healthy grains and starches such as potatoes, wheat bread, fiber-rich cereal, and brown rice. Choose whole grain options whenever possible. In adults, whole grains should account for 45-65% of your daily calories.  °Fruits and Vegetables °Eat plenty of fruits and vegetables. Fresh fruits and vegetables add fiber to your diet. °Meats and Other Protein Sources °Eat lean meat such as chicken and pork. Trim any fat off of meat before cooking it. Eggs, fish, and beans are other sources of protein. In adults, these foods should account for 10-35% of your daily calories. °Dairy °Choose low-fat milk and dairy options. Dairy includes fat and protein, as well as calcium.  °Fats and Oils °Limit high-fat foods such as fried foods, sweets, baked goods, sugary drinks.  °Other °Creamy sauces and condiments, such as mayonnaise, can add extra fat. Think about whether or not you need to use them, or use smaller amounts or low fat options. °WHAT FOODS ARE NOT RECOMMENDED? °· High fat foods, such as: °¨ Baked goods. °¨ Ice cream. °¨ French toast. °¨ Sweet rolls. °¨ Pizza. °¨ Cheese bread. °¨ Foods covered with batter, butter, creamy sauces, or cheese. °¨ Fried foods. °¨ Sugary drinks and desserts. °· Foods that cause gas or bloating °  °This information is not intended to replace advice given to you by your health care provider. Make sure you discuss any questions you have with your health care provider. °  °Document Released: 02/10/2013 Document Reviewed: 02/10/2013 °Elsevier Interactive Patient Education ©2016 [employer] Inc. ° °

## 2015-09-16 ENCOUNTER — Other Ambulatory Visit: Payer: Medicaid Other | Admitting: Obstetrics and Gynecology

## 2015-10-03 ENCOUNTER — Other Ambulatory Visit: Payer: Medicaid Other | Admitting: Obstetrics and Gynecology

## 2015-10-03 ENCOUNTER — Encounter: Payer: Self-pay | Admitting: Obstetrics and Gynecology

## 2015-10-05 ENCOUNTER — Ambulatory Visit: Payer: Medicaid Other | Admitting: Women's Health

## 2015-10-11 ENCOUNTER — Other Ambulatory Visit: Payer: Medicaid Other | Admitting: Women's Health

## 2015-10-18 ENCOUNTER — Other Ambulatory Visit: Payer: Medicaid Other | Admitting: Women's Health

## 2015-10-26 ENCOUNTER — Other Ambulatory Visit: Payer: Medicaid Other | Admitting: Women's Health

## 2015-11-21 ENCOUNTER — Other Ambulatory Visit: Payer: Medicaid Other | Admitting: Obstetrics and Gynecology

## 2016-02-07 ENCOUNTER — Telehealth: Payer: Self-pay | Admitting: *Deleted

## 2016-02-07 NOTE — Telephone Encounter (Signed)
Patient with complaints of vaginal "numbness". [facility] months ago and was told it was nerve pain. She is not sexually active but does have a brown discharge. She has appointment next Wednesday but wants to be seen sooner if possible. I transferred her to reschedule her appointment.

## 2016-02-08 ENCOUNTER — Ambulatory Visit (INDEPENDENT_AMBULATORY_CARE_PROVIDER_SITE_OTHER): Payer: Self-pay | Admitting: Obstetrics and Gynecology

## 2016-02-08 ENCOUNTER — Encounter (INDEPENDENT_AMBULATORY_CARE_PROVIDER_SITE_OTHER): Payer: Self-pay

## 2016-02-08 ENCOUNTER — Encounter: Payer: Self-pay | Admitting: Obstetrics and Gynecology

## 2016-02-08 VITALS — BP 150/62 | HR 76 | Ht 68.0 in | Wt 308.4 lb

## 2016-02-08 DIAGNOSIS — E282 Polycystic ovarian syndrome: Secondary | ICD-10-CM

## 2016-02-08 DIAGNOSIS — R030 Elevated blood-pressure reading, without diagnosis of hypertension: Secondary | ICD-10-CM

## 2016-02-08 DIAGNOSIS — N939 Abnormal uterine and vaginal bleeding, unspecified: Secondary | ICD-10-CM | POA: Insufficient documentation

## 2016-02-08 DIAGNOSIS — Z113 Encounter for screening for infections with a predominantly sexual mode of transmission: Secondary | ICD-10-CM | POA: Insufficient documentation

## 2016-02-08 MED ORDER — NORGESTIMATE-ETH ESTRADIOL 0.25-35 MG-MCG PO TABS: 1.0000 | ORAL_TABLET | Freq: Every day | ORAL | 11 refills | Status: DC

## 2016-02-08 MED ORDER — MEDROXYPROGESTERONE ACETATE 10 MG PO TABS: 10.0000 mg | ORAL_TABLET | Freq: Every day | ORAL | 0 refills | Status: DC

## 2016-02-08 NOTE — Progress Notes (Signed)
[facility] Visit  02/08/16           Patient name: Katie Roberson MRN [id number]  Date of birth: Dec 07, 1990  CC & HPI:  Katie Roberson is a 25 y.o. female presenting today for persistent vaginal bleeding that been ongoing for the past few months. She reports having irregular periods for the past 5-7 years, since high school. She reports a history of PCOS and is concerned if this is related. She also complains of vaginal numbness which she is primarily a concern regarding sexual intercourse. She denies current sexual partner and being currently sexually active. She states she was sexually active in high school and had issues with decreases pleasure at that time as well. She is not desiring pregnancy at this time. She has not been prescribed any medications to control bleeding in the past. She denies recent thyroid function test. She reports weight loss of ~12 pounds over the last 2 years with the help of increased activity and fewer calorie consumption. Mother states patient has expressed concern for HIV in the past but patient has not had any known exposure.   She has taken Provera in the past without relief. She was last seen in this office ~ February 2016.    ROS:  ROS +menorrhagia  +Vaginal numbness Otherwise negative   Pertinent History Reviewed:   Reviewed: Significant for PCOS Medical         Past Medical History:  Diagnosis Date  . Anxiety   . Chicken pox   . Gallstones   . PCOS (polycystic ovarian syndrome)                               Surgical Hx:   History reviewed. No pertinent surgical history. Medications: Reviewed & Updated - see associated section                       Current Outpatient Prescriptions:  .  ondansetron (ZOFRAN ODT) 4 MG disintegrating tablet, Take 1 tablet (4 mg total) by mouth every 8 (eight) hours as needed for nausea or vomiting. (Patient not taking: Reported on 02/08/2016), Disp: 20 tablet, Rfl: 0 .  oxyCODONE-acetaminophen  (PERCOCET/ROXICET) 5-325 MG tablet, Take 1-2 tablets by mouth every 6 (six) hours as needed. (Patient not taking: Reported on 02/08/2016), Disp: 20 tablet, Rfl: 0   Social History: Reviewed -  reports that she has never smoked. She has never used smokeless tobacco.  Objective Findings:  Vitals: Blood pressure (!) 150/62, pulse 76, height 5\' 8"  (1.727 m), weight (!) 308 lb 6.4 oz (139.9 kg).  Physical Examination: Pelvic -  VULVA: normal appearing vulva with no masses, tenderness or lesions,  VAGINA: normal appearing vagina with normal color and discharge, no lesions, clear mucus consistent with anovulation and light menstrual bleeding  CERVIX: normal appearing cervix without discharge or lesions,  Bimanual exam: deferred  Assessment & Plan:   A:  1. PCO abnormal uterine bleeding 2. STD screening  P:  1. Order thyroid function test, HIV and RPR testing 2. Take Provera 10 mg x10 days and then start Sprintec 3. Follow up in 4 weeks For discussion of sexual dysfunction issues if desired    By signing my name below, I, Sonum Patel, attest that this documentation has been prepared under the direction and in the presence of Tilda Burrow, MD. Electronically Signed: Sonum Patel, Neurosurgeon. 02/08/16. 9:13 AM.  I personally performed the services described in this documentation, which was SCRIBED in my presence. The recorded information has been reviewed and considered accurate. It has been edited as necessary during review. Tilda Burrow, MD

## 2016-02-09 LAB — HIV ANTIBODY (ROUTINE TESTING W REFLEX): HIV SCREEN 4TH GENERATION: NONREACTIVE

## 2016-02-09 LAB — RPR: RPR: NONREACTIVE

## 2016-02-09 LAB — TSH: TSH: 0.811 u[IU]/mL (ref 0.450–4.500)

## 2016-02-15 ENCOUNTER — Ambulatory Visit: Payer: Medicaid Other | Admitting: Obstetrics and Gynecology

## 2016-02-22 ENCOUNTER — Encounter: Payer: Self-pay | Admitting: Obstetrics and Gynecology

## 2016-02-29 ENCOUNTER — Ambulatory Visit (INDEPENDENT_AMBULATORY_CARE_PROVIDER_SITE_OTHER): Payer: Medicaid Other | Admitting: Obstetrics and Gynecology

## 2016-02-29 ENCOUNTER — Other Ambulatory Visit (HOSPITAL_COMMUNITY)
Admission: RE | Admit: 2016-02-29 | Discharge: 2016-02-29 | Disposition: A | Discharge status: Home / Self Care | Payer: Medicaid Other | Source: Ambulatory Visit | Attending: Obstetrics and Gynecology | Admitting: Obstetrics and Gynecology

## 2016-02-29 ENCOUNTER — Encounter: Payer: Self-pay | Admitting: Obstetrics and Gynecology

## 2016-02-29 VITALS — BP 153/72 | HR 76 | Ht 68.5 in | Wt 303.0 lb

## 2016-02-29 DIAGNOSIS — Z01419 Encounter for gynecological examination (general) (routine) without abnormal findings: Secondary | ICD-10-CM | POA: Insufficient documentation

## 2016-02-29 DIAGNOSIS — Z113 Encounter for screening for infections with a predominantly sexual mode of transmission: Secondary | ICD-10-CM | POA: Insufficient documentation

## 2016-02-29 DIAGNOSIS — Z3009 Encounter for other general counseling and advice on contraception: Secondary | ICD-10-CM

## 2016-02-29 DIAGNOSIS — Z308 Encounter for other contraceptive management: Secondary | ICD-10-CM

## 2016-02-29 NOTE — Progress Notes (Signed)
Assessment:  Annual Gyn Exam Patient seemed moderately anxious and/or hesitant  with exam. Unsure how she was perceiving the interaction. Efforts to converse with pt were disjointed.Pt brought to office after examination for further effort at conversation.   Plan:  1. pap smear done, next pap due 3 years 2. Finish course of Provera, wait 5 days and then start Sprintec  3    Self breast examination encouraged  4    return annually or prn Subjective:  Katie Roberson is a 26 y.o. female [id number] who presents for annual exam. No LMP recorded. Patient is not currently having periods (Reason: Irregular Periods). The patient has complaints today of continued vaginal bleeding. She was last seen in the office on 02/08/16 and was started on a 10 day course of Provera. She states she did not start the medication for a period of time after the visit; states she is currently taking this medication but she continues to have abnormal bleeding that waxes and wanes in flow.   Patient also wants to address vaginal numbness. She became sexually active at age 26 and noticed this numbness soon after. She has been with her current partner for 6 years. She states the numbness has been present with prior partners as well. She denies history of sexual assault or forced sexual encounters at any point in her life.    The following portions of the patient's history were reviewed and updated as appropriate: allergies, current medications, past family history, past medical history, past social history, past surgical history and problem list. Past Medical History:  Diagnosis Date  . Anxiety   . Chicken pox   . Gallstones   . PCOS (polycystic ovarian syndrome)     History reviewed. No pertinent surgical history.   Current Outpatient Prescriptions:  .  medroxyPROGESTERone (PROVERA) 10 MG tablet, Take 1 tablet (10 mg total) by mouth daily., Disp: 10 tablet, Rfl: 0 .  norgestimate-ethinyl estradiol  (ORTHO-CYCLEN,SPRINTEC,PREVIFEM) 0.25-35 MG-MCG tablet, Take 1 tablet by mouth daily., Disp: 1 Package, Rfl: 11  Review of Systems Constitutional: negative Gastrointestinal: negative Genitourinary: uterine bleeding   Objective:  BP (!) 153/72 (BP Location: Left Arm, Patient Position: Sitting, Cuff Size: Large)   Pulse 76   Ht 5' 8.5" (1.74 m)   Wt (!) 303 lb (137.4 kg)   BMI 45.40 kg/m    BMI: Body mass index is 45.4 kg/m.  General Appearance: Alert, appropriate appearance for age. No acute distress HEENT: Grossly normal Neck / Thyroid:  Cardiovascular: RRR; normal S1, S2, no murmur Lungs: CTA bilaterally Back: No CVAT Breast Exam: No dimpling, nipple retraction or discharge. No masses or nodes., Normal to inspection, Normal breast tissue bilaterally and No masses or nodes.No dimpling, nipple retraction or discharge. Gastrointestinal: Soft, non-tender, no masses or organomegaly Pelvic Exam: External genitalia: normal general appearance Vaginal: normal mucosa without prolapse or lesions and normal without tenderness, induration or masses, moderate amount of bleeding with small clots Cervix: normal appearance Adnexa: normal bimanual exam Uterus: limited Rectovaginal: not indicated Lymphatic Exam: Non-palpable nodes in neck, clavicular, axillary, or inguinal regions  Skin: no rash or abnormalities Neurologic: Normal gait and speech, no tremor  Psychiatric: Alert and oriented, appropriate affect.  Urinalysis:Not done  Christin BachJohn Dwanna Goshert. MD Pgr [phone] 2:24 PM   By signing my name below, I, Sonum Patel, attest that this documentation has been prepared under the direction and in the presence of Tilda BurrowJohn V Julieanna Geraci, MD. Electronically Signed: Sonum Patel, Neurosurgeoncribe. 02/29/16. 2:24 PM.  I personally  performed the services described in this documentation, which was SCRIBED in my presence. The recorded information has been reviewed and considered accurate. It has been edited as necessary  during review. Tilda Burrow, MD

## 2016-03-06 LAB — CYTOLOGY - PAP
Chlamydia: NEGATIVE
Diagnosis: NEGATIVE
NEISSERIA GONORRHEA: NEGATIVE

## 2016-03-07 ENCOUNTER — Ambulatory Visit: Payer: Medicaid Other | Admitting: Obstetrics and Gynecology

## 2016-08-30 ENCOUNTER — Emergency Department
Admission: EM | Admit: 2016-08-30 | Discharge: 2016-08-30 | Disposition: A | Discharge status: Home / Self Care | Payer: 59 | Attending: Student in an Organized Health Care Education/Training Program | Admitting: Student in an Organized Health Care Education/Training Program

## 2016-08-30 ENCOUNTER — Encounter: Payer: Self-pay | Admitting: Emergency Medicine

## 2016-08-30 DIAGNOSIS — Z87891 Personal history of nicotine dependence: Secondary | ICD-10-CM | POA: Insufficient documentation

## 2016-08-30 DIAGNOSIS — R1110 Vomiting, unspecified: Secondary | ICD-10-CM | POA: Diagnosis present

## 2016-08-30 DIAGNOSIS — Z8719 Personal history of other diseases of the digestive system: Secondary | ICD-10-CM | POA: Insufficient documentation

## 2016-08-30 DIAGNOSIS — Z79899 Other long term (current) drug therapy: Secondary | ICD-10-CM | POA: Insufficient documentation

## 2016-08-30 DIAGNOSIS — R112 Nausea with vomiting, unspecified: Secondary | ICD-10-CM | POA: Diagnosis not present

## 2016-08-30 LAB — COMPREHENSIVE METABOLIC PANEL
ALK PHOS: 70 U/L (ref 38–126)
ALT: 18 U/L (ref 14–54)
AST: 19 U/L (ref 15–41)
Albumin: 4 g/dL (ref 3.5–5.0)
Anion gap: 7 (ref 5–15)
BILIRUBIN TOTAL: 0.6 mg/dL (ref 0.3–1.2)
BUN: 10 mg/dL (ref 6–20)
CALCIUM: 9.2 mg/dL (ref 8.9–10.3)
CO2: 24 mmol/L (ref 22–32)
CREATININE: 0.76 mg/dL (ref 0.44–1.00)
Chloride: 107 mmol/L (ref 101–111)
Glucose, Bld: 87 mg/dL (ref 65–99)
Potassium: 3.6 mmol/L (ref 3.5–5.1)
Sodium: 138 mmol/L (ref 135–145)
Total Protein: 7.9 g/dL (ref 6.5–8.1)

## 2016-08-30 LAB — CBC
HCT: 35.4 % (ref 35.0–47.0)
Hemoglobin: 12.1 g/dL (ref 12.0–16.0)
MCH: 30 pg (ref 26.0–34.0)
MCHC: 34.2 g/dL (ref 32.0–36.0)
MCV: 87.8 fL (ref 80.0–100.0)
PLATELETS: 282 10*3/uL (ref 150–440)
RBC: 4.04 MIL/uL (ref 3.80–5.20)
RDW: 14.3 % (ref 11.5–14.5)
WBC: 12.4 10*3/uL — ABNORMAL HIGH (ref 3.6–11.0)

## 2016-08-30 LAB — URINALYSIS, COMPLETE (UACMP) WITH MICROSCOPIC
Bilirubin Urine: NEGATIVE
GLUCOSE, UA: NEGATIVE mg/dL
Hgb urine dipstick: NEGATIVE
KETONES UR: 5 mg/dL — AB
Leukocytes, UA: NEGATIVE
Nitrite: NEGATIVE
PH: 5 (ref 5.0–8.0)
Protein, ur: NEGATIVE mg/dL
RBC / HPF: NONE SEEN RBC/hpf (ref 0–5)
Specific Gravity, Urine: 1.027 (ref 1.005–1.030)

## 2016-08-30 LAB — LIPASE, BLOOD: Lipase: 36 U/L (ref 11–51)

## 2016-08-30 LAB — POCT PREGNANCY, URINE: Preg Test, Ur: NEGATIVE

## 2016-08-30 MED ORDER — RANITIDINE HCL 150 MG PO TABS: 150.0000 mg | ORAL_TABLET | Freq: Two times a day (BID) | ORAL | 0 refills | Status: DC

## 2016-08-30 MED ORDER — ONDANSETRON 4 MG PO TBDP
ORAL_TABLET | ORAL | Status: AC
  Filled 2016-08-30: qty 1

## 2016-08-30 MED ORDER — ONDANSETRON HCL 4 MG PO TABS
4.0000 mg | ORAL_TABLET | Freq: Once | ORAL | Status: AC
  Administered 2016-08-30: 4 mg via ORAL
  Filled 2016-08-30: qty 1

## 2016-08-30 MED ORDER — GI COCKTAIL ~~LOC~~
ORAL | Status: AC
  Administered 2016-08-30: 30 mL via ORAL
  Filled 2016-08-30: qty 30

## 2016-08-30 MED ORDER — GI COCKTAIL ~~LOC~~
30.0000 mL | Freq: Once | ORAL | Status: AC
  Administered 2016-08-30: 30 mL via ORAL

## 2016-08-30 MED ORDER — ONDANSETRON HCL 4 MG PO TABS
ORAL_TABLET | ORAL | Status: AC
  Filled 2016-08-30: qty 1

## 2016-08-30 NOTE — ED Provider Notes (Signed)
[facility] Emergency Department Provider Note    First MD Initiated Contact with Patient 08/30/16 1831     (approximate)  I have reviewed the triage vital signs and the nursing notes.   HISTORY  Chief Complaint Emesis    HPI Katie Roberson is a 26 y.o. female with a history of gallstones presents with intermittent epigastric pain associated with 2 episodes of vomiting with blood tinged streaks in the first one. Denies any abdominal pain at this time. No fevers. Does feel some mild nausea. This pain is not consistent with her gallstones. States she has been taking ibuprofen. States that she ate spaghetti with meat sauce last night. Denies any chance of being pregnant. No dysuria. No chest pain or shortness of breath.   Past Medical History:  Diagnosis Date  . Anxiety   . Chicken pox   . Gallstones   . PCOS (polycystic ovarian syndrome)    Family History  Problem Relation Age of Onset  . Diabetes Paternal Grandmother    No past surgical history on file. Patient Active Problem List   Diagnosis Date Noted  . Abnormal uterine bleeding (AUB) 02/08/2016  . Screening examination for STD (sexually transmitted disease) 02/08/2016  . Abnormal vaginal bleeding 01/29/2013  . PCO (polycystic ovaries) 12/26/2012  . DUB (dysfunctional uterine bleeding) 12/26/2012  . Morbid obesity (HCC) 07/16/2012      Prior to Admission medications   Medication Sig Start Date End Date Taking? Authorizing Provider  medroxyPROGESTERone (PROVERA) 10 MG tablet Take 1 tablet (10 mg total) by mouth daily. 02/08/16   Tilda Burrow, MD  norgestimate-ethinyl estradiol (ORTHO-CYCLEN,SPRINTEC,PREVIFEM) 0.25-35 MG-MCG tablet Take 1 tablet by mouth daily. 02/08/16   Tilda Burrow, MD  ranitidine (ZANTAC) 150 MG tablet Take 1 tablet (150 mg total) by mouth 2 (two) times daily. 08/30/16 08/30/17  Willy Eddy, MD    Allergies Patient has no known  allergies.    Social History Social History  Substance Use Topics  . Smoking status: Former Smoker    Types: Cigarettes  . Smokeless tobacco: Never Used  . Alcohol use No    Review of Systems Patient denies headaches, rhinorrhea, blurry vision, numbness, shortness of breath, chest pain, edema, cough, abdominal pain, nausea, vomiting, diarrhea, dysuria, fevers, rashes or hallucinations unless otherwise stated above in HPI. ____________________________________________   PHYSICAL EXAM:  VITAL SIGNS: Vitals:   08/30/16 1750  BP: 114/86  Pulse: 68  Resp: 18  Temp: 98.2 F (36.8 C)    Constitutional: Alert and oriented. Well appearing and in no acute distress. Eyes: Conjunctivae are normal.  Head: Atraumatic. Nose: No congestion/rhinnorhea. Mouth/Throat: Mucous membranes are moist.   Neck: No stridor. Painless ROM.  Cardiovascular: Normal rate, regular rhythm. Grossly normal heart sounds.  Good peripheral circulation. Respiratory: Normal respiratory effort.  No retractions. Lungs CTAB. Gastrointestinal: Soft and nontender. No distention. No abdominal bruits. No CVA tenderness. Genitourinary:  Musculoskeletal: No lower extremity tenderness nor edema.  No joint effusions. Neurologic:  Normal speech and language. No gross focal neurologic deficits are appreciated. No facial droop Skin:  Skin is warm, dry and intact. No rash noted. Psychiatric: Mood and affect are normal. Speech and behavior are normal.  ____________________________________________   LABS (all labs ordered are listed, but only abnormal results are displayed)  Results for orders placed or performed during the hospital encounter of 08/30/16 (from the past 24 hour(s))  Lipase, blood     Status: None   Collection Time: 08/30/16  5:52 PM  Result Value Ref Range   Lipase 36 11 - 51 U/L  Comprehensive metabolic panel     Status: None   Collection Time: 08/30/16  5:52 PM  Result Value Ref Range   Sodium 138  135 - 145 mmol/L   Potassium 3.6 3.5 - 5.1 mmol/L   Chloride 107 101 - 111 mmol/L   CO2 24 22 - 32 mmol/L   Glucose, Bld 87 65 - 99 mg/dL   BUN 10 6 - 20 mg/dL   Creatinine, Ser 1.610.76 0.44 - 1.00 mg/dL   Calcium 9.2 8.9 - 09.610.3 mg/dL   Total Protein 7.9 6.5 - 8.1 g/dL   Albumin 4.0 3.5 - 5.0 g/dL   AST 19 15 - 41 U/L   ALT 18 14 - 54 U/L   Alkaline Phosphatase 70 38 - 126 U/L   Total Bilirubin 0.6 0.3 - 1.2 mg/dL   GFR calc non Af Amer >60 >60 mL/min   GFR calc Af Amer >60 >60 mL/min   Anion gap 7 5 - 15  CBC     Status: Abnormal   Collection Time: 08/30/16  5:52 PM  Result Value Ref Range   WBC 12.4 (H) 3.6 - 11.0 K/uL   RBC 4.04 3.80 - 5.20 MIL/uL   Hemoglobin 12.1 12.0 - 16.0 g/dL   HCT 04.535.4 40.935.0 - 81.147.0 %   MCV 87.8 80.0 - 100.0 fL   MCH 30.0 26.0 - 34.0 pg   MCHC 34.2 32.0 - 36.0 g/dL   RDW 91.414.3 78.211.5 - 95.614.5 %   Platelets 282 150 - 440 K/uL  Urinalysis, Complete w Microscopic     Status: Abnormal   Collection Time: 08/30/16  5:52 PM  Result Value Ref Range   Color, Urine YELLOW (A) YELLOW   APPearance CLEAR (A) CLEAR   Specific Gravity, Urine 1.027 1.005 - 1.030   pH 5.0 5.0 - 8.0   Glucose, UA NEGATIVE NEGATIVE mg/dL   Hgb urine dipstick NEGATIVE NEGATIVE   Bilirubin Urine NEGATIVE NEGATIVE   Ketones, ur 5 (A) NEGATIVE mg/dL   Protein, ur NEGATIVE NEGATIVE mg/dL   Nitrite NEGATIVE NEGATIVE   Leukocytes, UA NEGATIVE NEGATIVE   RBC / HPF NONE SEEN 0 - 5 RBC/hpf   WBC, UA 0-5 0 - 5 WBC/hpf   Bacteria, UA RARE (A) NONE SEEN   Squamous Epithelial / LPF 0-5 (A) NONE SEEN   Mucous PRESENT   Pregnancy, urine POC     Status: None   Collection Time: 08/30/16  6:04 PM  Result Value Ref Range   Preg Test, Ur NEGATIVE NEGATIVE   ____________________________________________ ____________________________________________  RADIOLOGY   ____________________________________________   PROCEDURES  Procedure(s) performed:  Procedures    Critical Care performed:  no ____________________________________________   INITIAL IMPRESSION / ASSESSMENT AND PLAN / ED COURSE  Pertinent labs & imaging results that were available during my care of the patient were reviewed by me and considered in my medical decision making (see chart for details).  DDX: gastritis, enteritis, cholelithasis, biliary colic  Stanton Kidneysmerelda L Roberson is a 26 y.o. who presents to the ED with episodes of nausea and vomiting as described above. Her abdominal exam is soft and benign. She is no biliary elevation or LFT elevation to suggest acute cholelithiasis or cholecystitis. Possible gastritis. Blood work is reassuring. Based on her abdominal exam and do not feel that further diagnostic testing clinically indicated. Patient with improvement in symptoms in the ER. Will be given referral to  outpatient general surgery at the patient's request. At this point do not feel that she is stable for a period of outpatient observation. Encouraged patient to return in 12-24 hours if symptoms recur for repeat abdominal exam.      ____________________________________________   FINAL CLINICAL IMPRESSION(S) / ED DIAGNOSES  Final diagnoses:  Non-intractable vomiting with nausea, unspecified vomiting type      NEW MEDICATIONS STARTED DURING THIS VISIT:  New Prescriptions   RANITIDINE (ZANTAC) 150 MG TABLET    Take 1 tablet (150 mg total) by mouth 2 (two) times daily.     Note:  This document was prepared using Dragon voice recognition software and may include unintentional dictation errors.    Willy Eddy, MD 08/30/16 [phone]

## 2016-08-30 NOTE — ED Triage Notes (Signed)
Pt reports recently diagnosed with gallstones. Denies pain today but reports she has had nausea and vomiting. Pt reports seeing dark red specks in her vomit and is concerned that it was blood. Pt also reports urinary frequency.

## 2016-08-30 NOTE — Discharge Instructions (Signed)

## 2016-10-19 ENCOUNTER — Other Ambulatory Visit: Payer: Self-pay | Admitting: Family Medicine

## 2016-10-19 DIAGNOSIS — R102 Pelvic and perineal pain: Secondary | ICD-10-CM

## 2016-10-19 DIAGNOSIS — N946 Dysmenorrhea, unspecified: Secondary | ICD-10-CM

## 2016-10-25 ENCOUNTER — Ambulatory Visit: Payer: 59

## 2016-10-31 ENCOUNTER — Ambulatory Visit
Admission: RE | Admit: 2016-10-31 | Discharge: 2016-10-31 | Disposition: A | Discharge status: Home / Self Care | Payer: 59 | Source: Ambulatory Visit | Attending: Family Medicine | Admitting: Family Medicine

## 2016-10-31 DIAGNOSIS — R102 Pelvic and perineal pain: Secondary | ICD-10-CM | POA: Diagnosis present

## 2016-10-31 DIAGNOSIS — N946 Dysmenorrhea, unspecified: Secondary | ICD-10-CM | POA: Diagnosis present

## 2016-11-07 ENCOUNTER — Emergency Department (HOSPITAL_COMMUNITY): Payer: 59

## 2016-11-07 ENCOUNTER — Emergency Department (HOSPITAL_COMMUNITY)
Admission: EM | Admit: 2016-11-07 | Discharge: 2016-11-07 | Disposition: A | Discharge status: Home / Self Care | Payer: 59 | Attending: Emergency Medicine | Admitting: Emergency Medicine

## 2016-11-07 ENCOUNTER — Encounter (HOSPITAL_COMMUNITY): Payer: Self-pay

## 2016-11-07 DIAGNOSIS — Z79899 Other long term (current) drug therapy: Secondary | ICD-10-CM | POA: Diagnosis not present

## 2016-11-07 DIAGNOSIS — K8020 Calculus of gallbladder without cholecystitis without obstruction: Secondary | ICD-10-CM | POA: Diagnosis not present

## 2016-11-07 DIAGNOSIS — G8929 Other chronic pain: Secondary | ICD-10-CM | POA: Diagnosis not present

## 2016-11-07 DIAGNOSIS — B9689 Other specified bacterial agents as the cause of diseases classified elsewhere: Secondary | ICD-10-CM | POA: Insufficient documentation

## 2016-11-07 DIAGNOSIS — Z87891 Personal history of nicotine dependence: Secondary | ICD-10-CM | POA: Diagnosis not present

## 2016-11-07 DIAGNOSIS — N760 Acute vaginitis: Secondary | ICD-10-CM | POA: Diagnosis not present

## 2016-11-07 DIAGNOSIS — R109 Unspecified abdominal pain: Secondary | ICD-10-CM

## 2016-11-07 DIAGNOSIS — R1084 Generalized abdominal pain: Secondary | ICD-10-CM | POA: Insufficient documentation

## 2016-11-07 HISTORY — DX: Pelvic and perineal pain unspecified side: R10.20

## 2016-11-07 HISTORY — DX: Other chronic pain: G89.29

## 2016-11-07 HISTORY — DX: Unspecified abdominal pain: R10.9

## 2016-11-07 HISTORY — DX: Dysmenorrhea, unspecified: N94.6

## 2016-11-07 HISTORY — DX: Pelvic and perineal pain: R10.2

## 2016-11-07 LAB — COMPREHENSIVE METABOLIC PANEL
ALT: 13 U/L — ABNORMAL LOW (ref 14–54)
ANION GAP: 9 (ref 5–15)
AST: 18 U/L (ref 15–41)
Albumin: 3.9 g/dL (ref 3.5–5.0)
Alkaline Phosphatase: 79 U/L (ref 38–126)
BUN: 12 mg/dL (ref 6–20)
CHLORIDE: 103 mmol/L (ref 101–111)
CO2: 24 mmol/L (ref 22–32)
Calcium: 8.9 mg/dL (ref 8.9–10.3)
Creatinine, Ser: 0.99 mg/dL (ref 0.44–1.00)
GFR calc Af Amer: >60 mL/min (ref >60)
GLUCOSE: 86 mg/dL (ref 65–99)
POTASSIUM: 3.9 mmol/L (ref 3.5–5.1)
Sodium: 136 mmol/L (ref 135–145)
TOTAL PROTEIN: 7.5 g/dL (ref 6.5–8.1)
Total Bilirubin: 0.4 mg/dL (ref 0.3–1.2)

## 2016-11-07 LAB — URINALYSIS, ROUTINE W REFLEX MICROSCOPIC
BACTERIA UA: NONE SEEN
BILIRUBIN URINE: NEGATIVE
Glucose, UA: NEGATIVE mg/dL
KETONES UR: NEGATIVE mg/dL
LEUKOCYTES UA: NEGATIVE
Nitrite: NEGATIVE
PROTEIN: NEGATIVE mg/dL
Specific Gravity, Urine: 1.015 (ref 1.005–1.030)
pH: 6 (ref 5.0–8.0)

## 2016-11-07 LAB — CBC
HCT: 38 % (ref 36.0–46.0)
HEMOGLOBIN: 12.8 g/dL (ref 12.0–15.0)
MCH: 30 pg (ref 26.0–34.0)
MCHC: 33.7 g/dL (ref 30.0–36.0)
MCV: 89 fL (ref 78.0–100.0)
Platelets: 281 10*3/uL (ref 150–400)
RBC: 4.27 MIL/uL (ref 3.87–5.11)
RDW: 13.1 % (ref 11.5–15.5)
WBC: 9.8 10*3/uL (ref 4.0–10.5)

## 2016-11-07 LAB — WET PREP, GENITAL
SPERM: NONE SEEN
Trich, Wet Prep: NONE SEEN
YEAST WET PREP: NONE SEEN

## 2016-11-07 LAB — PREGNANCY, URINE: PREG TEST UR: NEGATIVE

## 2016-11-07 LAB — LIPASE, BLOOD: Lipase: 54 U/L — ABNORMAL HIGH (ref 11–51)

## 2016-11-07 MED ORDER — METRONIDAZOLE 500 MG PO TABS: 500.0000 mg | ORAL_TABLET | Freq: Two times a day (BID) | ORAL | 0 refills | Status: DC

## 2016-11-07 NOTE — ED Triage Notes (Signed)
Pt reports that she has been experiencing lower abd pain for years. Pain is in the lower abd and pain is intermittent. Pt reports that she spots very frequently. Pt reports problem with constipation. Pt reports that she has gone to many doctors and unable to get a diagnosis

## 2016-11-07 NOTE — Discharge Instructions (Signed)
Eat a bland diet, avoiding greasy, fatty, fried foods, as well as spicy and acidic foods or beverages.  Avoid eating within the hour or 2 before going to bed or laying down.  Also avoid teas, colas, coffee, chocolate, pepermint and spearment. Take the prescription as directed.  Call your regular medical doctor and your OB/GYN doctor tomorrow to schedule a follow up appointment within the next week.  Return to the Emergency Department immediately if worsening.

## 2016-11-07 NOTE — ED Notes (Signed)
Pt ambulatory to waiting room. Pt verbalized understanding of discharge instructions.   

## 2016-11-07 NOTE — ED Provider Notes (Signed)
[facility]-EMERGENCY DEPT Provider Note   CSN: [id number] Arrival date & time: 11/07/16  1349     History   Chief Complaint Chief Complaint  Patient presents with  . Abdominal Pain    HPI Katie Roberson is a 26 y.o. female.  HPI  Pt was seen at 1900.  Per pt, c/o gradual onset and persistence of constant acute flair of her chronic upper and lower abd "pain" since "she was in high school," worse over the past several years.  Has been associated with "vaginal numbness."   Denies N/V, no diarrhea, no fevers, no back pain, no rash, no CP/SOB, no black or blood in stools, no vaginal discharge, no dysuria/hematuria. The symptoms have been associated with no other complaints. The patient has a significant history of similar symptoms previously, recently being evaluated for this complaint and multiple prior evals for same without definitive diagnosis.       OB/GYN: Dr. Emelda Fear Past Medical History:  Diagnosis Date  . Anxiety   . Chicken pox   . Chronic abdominal pain   . Chronic pelvic pain in female   . Dysmenorrhea   . Gallstones   . PCOS (polycystic ovarian syndrome)   . Vaginal pain    chronic    Patient Active Problem List   Diagnosis Date Noted  . Abnormal uterine bleeding (AUB) 02/08/2016  . Screening examination for STD (sexually transmitted disease) 02/08/2016  . Abnormal vaginal bleeding 01/29/2013  . PCO (polycystic ovaries) 12/26/2012  . DUB (dysfunctional uterine bleeding) 12/26/2012  . Morbid obesity (HCC) 07/16/2012    History reviewed. No pertinent surgical history.  OB History    Gravida Para Term Preterm AB Living   0 0 0 0 0 0   SAB TAB Ectopic Multiple Live Births   0 0 0 0         Home Medications    Prior to Admission medications   Medication Sig Start Date End Date Taking? Authorizing Provider  medroxyPROGESTERone (PROVERA) 10 MG tablet Take 1 tablet (10 mg total) by mouth daily. 02/08/16   Tilda Burrow, MD  norgestimate-ethinyl  estradiol (ORTHO-CYCLEN,SPRINTEC,PREVIFEM) 0.25-35 MG-MCG tablet Take 1 tablet by mouth daily. 02/08/16   Tilda Burrow, MD  ranitidine (ZANTAC) 150 MG tablet Take 1 tablet (150 mg total) by mouth 2 (two) times daily. 08/30/16 08/30/17  Willy Eddy, MD    Family History Family History  Problem Relation Age of Onset  . Diabetes Paternal Grandmother     Social History Social History  Substance Use Topics  . Smoking status: Former Smoker    Types: Cigarettes  . Smokeless tobacco: Never Used  . Alcohol use No     Allergies   Patient has no known allergies.   Review of Systems Review of Systems ROS: Statement: All systems negative except as marked or noted in the HPI; Constitutional: Negative for fever and chills. ; ; Eyes: Negative for eye pain, redness and discharge. ; ; ENMT: Negative for ear pain, hoarseness, nasal congestion, sinus pressure and sore throat. ; ; Cardiovascular: Negative for chest pain, palpitations, diaphoresis, dyspnea and peripheral edema. ; ; Respiratory: Negative for cough, wheezing and stridor. ; ; Gastrointestinal: +chronic abd pain. Negative for nausea, vomiting, diarrhea, blood in stool, hematemesis, jaundice and rectal bleeding. . ; ; Genitourinary: Negative for dysuria, flank pain and hematuria. ; ; GYN:  +"vaginal numbness." No vaginal bleeding, no vaginal discharge, no vulvar pain. ;; Musculoskeletal: Negative for back pain and neck pain. Negative for  swelling and trauma.; ; Skin: Negative for pruritus, rash, abrasions, blisters, bruising and skin lesion.; ; Neuro: Negative for headache, lightheadedness and neck stiffness. Negative for weakness, altered level of consciousness, altered mental status, extremity weakness, paresthesias, involuntary movement, seizure and syncope.      Physical Exam Updated Vital Signs BP 124/77 (BP Location: Right Arm)   Pulse 77   Temp 98.2 F (36.8 C) (Oral)   Resp 18   Ht  (1.727 m)   Wt 127 kg (280 lb)    LMP 10/17/2016   SpO2 100%   BMI 42.57 kg/m   Physical Exam 1905: Physical examination:  Nursing notes reviewed; Vital signs and O2 SAT reviewed;  Constitutional: Well developed, Well nourished, Well hydrated, In no acute distress; Head:  Normocephalic, atraumatic; Eyes: EOMI, PERRL, No scleral icterus; ENMT: Mouth and pharynx normal, Mucous membranes moist; Neck: Supple, Full range of motion, No lymphadenopathy; Cardiovascular: Regular rate and rhythm, No gallop; Respiratory: Breath sounds clear & equal bilaterally, No wheezes.  Speaking full sentences with ease, Normal respiratory effort/excursion; Chest: Nontender, Movement normal; Abdomen: Soft, Nontender when distracted. Nondistended, Normal bowel sounds; Genitourinary: No CVA tenderness. Pelvic exam performed with permission of pt and female ED tech assist during exam.  External genitalia w/o lesions. Vaginal vault with thin yellow discharge.  Cervix w/o lesions, not friable, GC/chlam and wet prep obtained and sent to lab.  Bimanual exam w/o CMT or adnexal tenderness, +mild suprapubic tenderness..; Extremities: Pulses normal, No tenderness, No edema, No calf edema or asymmetry.; Neuro: AA&Ox3, Major CN grossly intact.  Speech clear. No gross focal motor or sensory deficits in extremities.; Skin: Color normal, Warm, Dry.; Psych:  Anxious.    ED Treatments / Results  Labs (all labs ordered are listed, but only abnormal results are displayed)   EKG  EKG Interpretation None       Radiology   Procedures Procedures (including critical care time)  Medications Ordered in ED Medications - No data to display   Initial Impression / Assessment and Plan / ED Course  I have reviewed the triage vital signs and the nursing notes.  Pertinent labs & imaging results that were available during my care of the patient were reviewed by me and considered in my medical decision making (see chart for details).  MDM Reviewed: previous chart,  nursing note and vitals Reviewed previous: labs and ultrasound Interpretation: labs and CT scan   Results for orders placed or performed during the hospital encounter of 11/07/16  Wet prep, genital  Result Value Ref Range   Yeast Wet Prep HPF POC NONE SEEN NONE SEEN   Trich, Wet Prep NONE SEEN NONE SEEN   Clue Cells Wet Prep HPF POC PRESENT (A) NONE SEEN   WBC, Wet Prep HPF POC RARE (A) NONE SEEN   Sperm NONE SEEN   Lipase, blood  Result Value Ref Range   Lipase 54 (H) 11 - 51 U/L  Comprehensive metabolic panel  Result Value Ref Range   Sodium 136 135 - 145 mmol/L   Potassium 3.9 3.5 - 5.1 mmol/L   Chloride 103 101 - 111 mmol/L   CO2 24 22 - 32 mmol/L   Glucose, Bld 86 65 - 99 mg/dL   BUN 12 6 - 20 mg/dL   Creatinine, Ser 0.98 0.44 - 1.00 mg/dL   Calcium 8.9 8.9 - 11.9 mg/dL   Total Protein 7.5 6.5 - 8.1 g/dL   Albumin 3.9 3.5 - 5.0 g/dL   AST 18 15 -  41 U/L   ALT 13 (L) 14 - 54 U/L   Alkaline Phosphatase 79 38 - 126 U/L   Total Bilirubin 0.4 0.3 - 1.2 mg/dL   GFR calc non Af Amer >60 >60 mL/min   GFR calc Af Amer >60 >60 mL/min   Anion gap 9 5 - 15  CBC  Result Value Ref Range   WBC 9.8 4.0 - 10.5 K/uL   RBC 4.27 3.87 - 5.11 MIL/uL   Hemoglobin 12.8 12.0 - 15.0 g/dL   HCT 16.1 09.6 - 04.5 %   MCV 89.0 78.0 - 100.0 fL   MCH 30.0 26.0 - 34.0 pg   MCHC 33.7 30.0 - 36.0 g/dL   RDW 40.9 81.1 - 91.4 %   Platelets 281 150 - 400 K/uL  Urinalysis, Routine w reflex microscopic  Result Value Ref Range   Color, Urine YELLOW YELLOW   APPearance CLEAR CLEAR   Specific Gravity, Urine 1.015 1.005 - 1.030   pH 6.0 5.0 - 8.0   Glucose, UA NEGATIVE NEGATIVE mg/dL   Hgb urine dipstick LARGE (A) NEGATIVE   Bilirubin Urine NEGATIVE NEGATIVE   Ketones, ur NEGATIVE NEGATIVE mg/dL   Protein, ur NEGATIVE NEGATIVE mg/dL   Nitrite NEGATIVE NEGATIVE   Leukocytes, UA NEGATIVE NEGATIVE   RBC / HPF 0-5 0 - 5 RBC/hpf   WBC, UA 0-5 0 - 5 WBC/hpf   Bacteria, UA NONE SEEN NONE SEEN    Squamous Epithelial / LPF 0-5 (A) NONE SEEN  Pregnancy, urine  Result Value Ref Range   Preg Test, Ur NEGATIVE NEGATIVE   Ct Abdomen Pelvis Wo Contrast Result Date: 11/07/2016 CLINICAL DATA:  Lower abdominal pain for years. Pain is intermittent. EXAM: CT ABDOMEN AND PELVIS WITHOUT CONTRAST TECHNIQUE: Multidetector CT imaging of the abdomen and pelvis was performed following the standard protocol without IV contrast. COMPARISON:  06/06/2014 an abdominal ultrasound 05/13/1958 FINDINGS: Lower chest: Lung bases are clear. Hepatobiliary: Evidence for cholesterol stones in the gallbladder. Otherwise, gallbladder appears normal. Normal appearance the liver on this noncontrast examination. Pancreas: Normal appearance of the pancreas without inflammation or duct dilatation. Spleen: Normal appearance of spleen without enlargement. Adrenals/Urinary Tract: Normal adrenal glands. Normal appearance of the kidneys. Negative for kidney stones or hydronephrosis. Normal appearance of the urinary bladder. Stomach/Bowel: Stomach is within normal limits. Appendix appears normal. No evidence of bowel wall thickening, distention, or inflammatory changes. Vascular/Lymphatic: No significant vascular findings are present. No enlarged abdominal or pelvic lymph nodes. Reproductive: Limited evaluation of the uterus and adnexal structures on this noncontrast examination. Other: No significant free fluid.  No free air. Musculoskeletal: No acute abnormality. IMPRESSION: No acute abnormality in the abdomen or pelvis. Cholelithiasis. Electronically Signed   By: Richarda Overlie M.D.   On: 11/07/2016 21:02    US Pelvis Complete Result Date: 10/31/2016 CLINICAL DATA:  Chronic vaginal pain, dysmenorrhea, symptoms worse over the past year. The patient is EXAM: TRANSABDOMINAL AND TRANSVAGINAL ULTRASOUND OF PELVIS TECHNIQUE: Both transabdominal and transvaginal ultrasound examinations of the pelvis were performed. Transabdominal technique was  performed for global imaging of the pelvis including uterus, ovaries, adnexal regions, and pelvic cul-de-sac. It was necessary to proceed with endovaginal exam following the transabdominal exam to visualize the right ovary and bilateral adnexal regions. COMPARISON:  None in PACs FINDINGS: Uterus Measurements: 7.2 x 3.4 x 4.1 cm. No fibroids or other mass visualized. Endometrium Thickness: 6.2 mm.  No focal abnormality visualized. Right ovary Measurements: 3.1 x 2.5 x 3.4 cm. Normal appearance/no adnexal  mass. Left ovary Measurements: 3.4 x 2.4 x 2.6 cm. The left ovary is positioned in the cul de sac. Normal appearance/no adnexal mass. Other findings No abnormal free fluid. IMPRESSION: Normal pelvic ultrasound examination. If bleeding remains unresponsive to hormonal or medical therapy, sonohysterogram should be considered for focal lesion work-up. (Ref: Radiological Reasoning: Algorithmic Workup of Abnormal Vaginal Bleeding with Endovaginal Sonography and Sonohysterography. AJR 2008; 409:W11-91) Electronically Signed   By: David  [place] M.D.   On: 10/31/2016 13:04    1905:  Pt has had this complaint since high school, has been evaluated by OB/GYN and PMD without definitive dx. Recent reassuring pelvic US as above; will not repeat today. Expectations for ED visit set today; pt verb understanding.   2200:  Will tx for BV. Strongly encouraged to f/u with her OB/GYN MD for good continuity of care and control of her chronic pain.  Dx and testing d/w pt and family.  Questions answered.  Verb understanding, agreeable to d/c home with outpt f/u.     Final Clinical Impressions(s) / ED Diagnoses   Final diagnoses:  None    New Prescriptions New Prescriptions   No medications on file     Samuel Jester, DO 11/12/16 1219

## 2016-11-09 LAB — GC/CHLAMYDIA PROBE AMP (~~LOC~~) NOT AT ARMC
Chlamydia: NEGATIVE
Neisseria Gonorrhea: NEGATIVE

## 2017-03-22 ENCOUNTER — Other Ambulatory Visit: Payer: Self-pay

## 2017-03-22 ENCOUNTER — Encounter (HOSPITAL_COMMUNITY): Payer: Self-pay | Admitting: *Deleted

## 2017-03-22 ENCOUNTER — Emergency Department (HOSPITAL_COMMUNITY)
Admission: EM | Admit: 2017-03-22 | Discharge: 2017-03-22 | Disposition: A | Discharge status: Home / Self Care | Payer: 59 | Attending: Emergency Medicine | Admitting: Emergency Medicine

## 2017-03-22 DIAGNOSIS — N760 Acute vaginitis: Secondary | ICD-10-CM | POA: Insufficient documentation

## 2017-03-22 DIAGNOSIS — Z87891 Personal history of nicotine dependence: Secondary | ICD-10-CM | POA: Insufficient documentation

## 2017-03-22 DIAGNOSIS — Z79899 Other long term (current) drug therapy: Secondary | ICD-10-CM | POA: Insufficient documentation

## 2017-03-22 DIAGNOSIS — B9689 Other specified bacterial agents as the cause of diseases classified elsewhere: Secondary | ICD-10-CM

## 2017-03-22 LAB — WET PREP, GENITAL
Sperm: NONE SEEN
TRICH WET PREP: NONE SEEN
Yeast Wet Prep HPF POC: NONE SEEN

## 2017-03-22 LAB — URINALYSIS, ROUTINE W REFLEX MICROSCOPIC
Bilirubin Urine: NEGATIVE
Glucose, UA: NEGATIVE mg/dL
Ketones, ur: NEGATIVE mg/dL
Leukocytes, UA: NEGATIVE
NITRITE: NEGATIVE
PROTEIN: NEGATIVE mg/dL
Specific Gravity, Urine: 1.03 (ref 1.005–1.030)
pH: 5 (ref 5.0–8.0)

## 2017-03-22 LAB — PREGNANCY, URINE: PREG TEST UR: NEGATIVE

## 2017-03-22 MED ORDER — METRONIDAZOLE 500 MG PO TABS: 500.0000 mg | ORAL_TABLET | Freq: Two times a day (BID) | ORAL | 0 refills | Status: DC

## 2017-03-22 MED ORDER — AZITHROMYCIN 250 MG PO TABS: 1000.0000 mg | ORAL_TABLET | Freq: Once | ORAL | Status: DC

## 2017-03-22 MED ORDER — CEFTRIAXONE SODIUM 250 MG IJ SOLR
250.0000 mg | Freq: Once | INTRAMUSCULAR | Status: DC
Start: 2017-03-22 — End: 2017-03-22

## 2017-03-22 NOTE — ED Notes (Signed)
Pt reports last sexual intercourse was 2 weeks ago discharge since then   Here for pregnancy test as well - has not taken one at home  Education: Health Dept handles STDs, home pregnancy test are cost effective and also accurate

## 2017-03-22 NOTE — ED Notes (Signed)
TT in to assess 

## 2017-03-22 NOTE — Discharge Instructions (Signed)
Take the antibiotic as directed until it is finished.  You can follow-up with the health department or your OB/GYN for recheck if needed.  Return to the ER for any worsening symptoms.

## 2017-03-22 NOTE — ED Triage Notes (Addendum)
Pt here requesting a pregnancy test and screening for std's. Pt c/o lower back pain, urinary frequency, some nausea and a small amt of bloody discharge. Pt, however, denies any pain at this time.

## 2017-03-23 NOTE — ED Provider Notes (Signed)
[facility] EMERGENCY DEPARTMENT Provider Note   CSN: [id number] Arrival date & time: 03/22/17  1857     History   Chief Complaint Chief Complaint  Patient presents with  . Back Pain    HPI JAHMIA BERRETT is a 27 y.o. female.  HPI  SHAMIR SEDLAR is a 27 y.o. female who presents to the Emergency Department requesting a pregnancy test and screening exam for STDs.  She complains of a small amount of yellow to bloody vaginal discharge for 1 week.  She also states that she has had intermittent lower back pain for several days.  She denies known injury.  She describes the pain is achy and worse with movement.  Pain resolves at rest.  Pain occasionally radiates into her thighs.  She has been taking over-the-counter pain relievers with minimal relief.  She states that she has had recent unprotected sex and is concerned she may have an STD.  She has not taken a home pregnancy test.  She denies fever, abdominal pain, dysuria, vomiting and genital rash or lesions.   Past Medical History:  Diagnosis Date  . Anxiety   . Chicken pox   . Chronic abdominal pain   . Chronic pelvic pain in female   . Dysmenorrhea   . Gallstones   . PCOS (polycystic ovarian syndrome)   . Vaginal pain    chronic    Patient Active Problem List   Diagnosis Date Noted  . Abnormal uterine bleeding (AUB) 02/08/2016  . Screening examination for STD (sexually transmitted disease) 02/08/2016  . Abnormal vaginal bleeding 01/29/2013  . PCO (polycystic ovaries) 12/26/2012  . DUB (dysfunctional uterine bleeding) 12/26/2012  . Morbid obesity (HCC) 07/16/2012    History reviewed. No pertinent surgical history.  OB History    Gravida Para Term Preterm AB Living   0 0 0 0 0 0   SAB TAB Ectopic Multiple Live Births   0 0 0 0         Home Medications    Prior to Admission medications   Medication Sig Start Date End Date Taking? Authorizing Provider  medroxyPROGESTERone (DEPO-PROVERA) 150 MG/ML  injection Inject 150 mg into the muscle every 3 (three) months.    [provider]  metroNIDAZOLE (FLAGYL) 500 MG tablet Take 1 tablet (500 mg total) by mouth 2 (two) times daily. 03/22/17   Deanza Upperman, PA-C  ranitidine (ZANTAC) 150 MG tablet Take 1 tablet (150 mg total) by mouth 2 (two) times daily. Patient not taking: Reported on 11/07/2016 08/30/16 08/30/17  Willy Eddy, MD    Family History Family History  Problem Relation Age of Onset  . Diabetes Paternal Grandmother     Social History Social History   Tobacco Use  . Smoking status: Former Smoker    Types: Cigarettes  . Smokeless tobacco: Never Used  Substance Use Topics  . Alcohol use: No  . Drug use: No     Allergies   Patient has no known allergies.   Review of Systems Review of Systems  Constitutional: Negative for activity change, appetite change, chills and fever.  Respiratory: Negative for chest tightness and shortness of breath.   Gastrointestinal: Negative for abdominal pain, diarrhea, nausea and vomiting.  Genitourinary: Positive for vaginal discharge. Negative for decreased urine volume, difficulty urinating, dysuria, flank pain, hematuria, urgency and vaginal bleeding.  Musculoskeletal: Positive for back pain.  Skin: Negative for rash.  Neurological: Negative for dizziness, weakness and numbness.  Hematological: Negative for adenopathy.  Psychiatric/Behavioral:  Negative for confusion.  All other systems reviewed and are negative.    Physical Exam Updated Vital Signs BP 138/74 (BP Location: Right Arm)   Pulse 92   Temp 98 F (36.7 C) (Oral)   Resp 18   Ht 5\' 7"  (1.702 m)   Wt (!) 138.1 kg (304 lb 6 oz)   LMP 02/10/2017   SpO2 100%   BMI 47.67 kg/m   Physical Exam  Constitutional: She is oriented to person, place, and time. She appears well-developed and well-nourished. No distress.  HENT:  Head: Atraumatic.  Mouth/Throat: Oropharynx is clear and moist.  Cardiovascular:  Normal rate and regular rhythm.  Pulmonary/Chest: Effort normal and breath sounds normal. No respiratory distress.  Abdominal: Soft. She exhibits no distension. There is no tenderness. There is no guarding.  Genitourinary: Uterus normal. There is no rash on the right labia. There is no rash on the left labia. Cervix exhibits no motion tenderness, no discharge and no friability. Right adnexum displays no mass and no tenderness. Left adnexum displays no mass and no tenderness. Vaginal discharge found.  Genitourinary Comments: Scant amount of white vaginal discharge present.  No vaginal bleeding, no cervical motion tenderness, no adnexal masses or tenderness.  Exam assisted by nursing staff.  Musculoskeletal: Normal range of motion. She exhibits no edema or tenderness.  No spinal tenderness.  Negative straight leg raise bilaterally.  No motor weakness  Neurological: She is alert and oriented to person, place, and time.  Skin: Skin is warm. No rash noted.  Psychiatric: She has a normal mood and affect.  Nursing note and vitals reviewed.    ED Treatments / Results  Labs (all labs ordered are listed, but only abnormal results are displayed) Labs Reviewed  WET PREP, GENITAL - Abnormal; Notable for the following components:      Result Value   Clue Cells Wet Prep HPF POC PRESENT (*)    WBC, Wet Prep HPF POC MODERATE (*)    All other components within normal limits  URINALYSIS, ROUTINE W REFLEX MICROSCOPIC - Abnormal; Notable for the following components:   APPearance HAZY (*)    Hgb urine dipstick LARGE (*)    Bacteria, UA RARE (*)    Squamous Epithelial / LPF 0-5 (*)    All other components within normal limits  URINE CULTURE  PREGNANCY, URINE  GC/CHLAMYDIA PROBE AMP ([place]) NOT AT [facility]    EKG  EKG Interpretation None       Radiology No results found.  Procedures Procedures (including critical care time)  Medications Ordered in ED Medications - No data to  display   Initial Impression / Assessment and Plan / ED Course  I have reviewed the triage vital signs and the nursing notes.  Pertinent labs & imaging results that were available during my care of the patient were reviewed by me and considered in my medical decision making (see chart for details).     Patient to the emergency room requesting screening exam for STDs.  Patient is well-appearing.  No back pain here.  Initially, patient was requesting treatment for possible STD, after pelvic exam,  patient has changed her mind and now states that she prefers to wait for culture results before treatment.  I have explained that her symptoms may be related to bacterial vaginosis given the results of wet prep, patient agrees to a prescription for Flagyl and understands that she will be notified for positive results of cultures.  No abdominal tenderness on exam.  No concerning symptoms for PID or TOA.  She appears safe for discharge home, return precautions were discussed.  Final Clinical Impressions(s) / ED Diagnoses   Final diagnoses:  Bacterial vaginosis    ED Discharge Orders        Ordered    metroNIDAZOLE (FLAGYL) 500 MG tablet  2 times daily     03/22/17 2123       Pauline Aus, PA-C 03/23/17 2250    Benjiman Core, MD 03/23/17 2330

## 2017-03-24 LAB — URINE CULTURE: CULTURE: NO GROWTH

## 2017-03-25 LAB — GC/CHLAMYDIA PROBE AMP (~~LOC~~) NOT AT ARMC
CHLAMYDIA, DNA PROBE: NEGATIVE
NEISSERIA GONORRHEA: NEGATIVE

## 2017-04-15 ENCOUNTER — Emergency Department (HOSPITAL_COMMUNITY)
Admission: EM | Admit: 2017-04-15 | Discharge: 2017-04-15 | Disposition: A | Discharge status: Home / Self Care | Payer: Medicaid Other | Attending: Emergency Medicine | Admitting: Emergency Medicine

## 2017-04-15 ENCOUNTER — Other Ambulatory Visit: Payer: Self-pay

## 2017-04-15 ENCOUNTER — Encounter (HOSPITAL_COMMUNITY): Payer: Self-pay | Admitting: Emergency Medicine

## 2017-04-15 DIAGNOSIS — Z87891 Personal history of nicotine dependence: Secondary | ICD-10-CM | POA: Insufficient documentation

## 2017-04-15 DIAGNOSIS — T7840XA Allergy, unspecified, initial encounter: Secondary | ICD-10-CM | POA: Insufficient documentation

## 2017-04-15 DIAGNOSIS — L509 Urticaria, unspecified: Secondary | ICD-10-CM

## 2017-04-15 DIAGNOSIS — R1013 Epigastric pain: Secondary | ICD-10-CM

## 2017-04-15 DIAGNOSIS — R1010 Upper abdominal pain, unspecified: Secondary | ICD-10-CM | POA: Insufficient documentation

## 2017-04-15 LAB — COMPREHENSIVE METABOLIC PANEL
ALBUMIN: 3.5 g/dL (ref 3.5–5.0)
ALT: 14 U/L (ref 14–54)
AST: 20 U/L (ref 15–41)
Alkaline Phosphatase: 74 U/L (ref 38–126)
Anion gap: 8 (ref 5–15)
BILIRUBIN TOTAL: 0.5 mg/dL (ref 0.3–1.2)
BUN: 8 mg/dL (ref 6–20)
CHLORIDE: 107 mmol/L (ref 101–111)
CO2: 21 mmol/L — AB (ref 22–32)
Calcium: 8.7 mg/dL — ABNORMAL LOW (ref 8.9–10.3)
Creatinine, Ser: 0.84 mg/dL (ref 0.44–1.00)
GFR calc Af Amer: >60 mL/min (ref >60)
GFR calc non Af Amer: >60 mL/min (ref >60)
GLUCOSE: 101 mg/dL — AB (ref 65–99)
POTASSIUM: 3.8 mmol/L (ref 3.5–5.1)
SODIUM: 136 mmol/L (ref 135–145)
Total Protein: 7.2 g/dL (ref 6.5–8.1)

## 2017-04-15 LAB — URINALYSIS, ROUTINE W REFLEX MICROSCOPIC
BILIRUBIN URINE: NEGATIVE
Glucose, UA: NEGATIVE mg/dL
KETONES UR: NEGATIVE mg/dL
NITRITE: NEGATIVE
Protein, ur: NEGATIVE mg/dL
SPECIFIC GRAVITY, URINE: 1.03 (ref 1.005–1.030)
pH: 5 (ref 5.0–8.0)

## 2017-04-15 LAB — CBC WITH DIFFERENTIAL/PLATELET
BASOS ABS: 0 10*3/uL (ref 0.0–0.1)
BASOS PCT: 0 %
EOS ABS: 0 10*3/uL (ref 0.0–0.7)
Eosinophils Relative: 0 %
HEMATOCRIT: 41.2 % (ref 36.0–46.0)
Hemoglobin: 13.5 g/dL (ref 12.0–15.0)
Lymphocytes Relative: 17 %
Lymphs Abs: 2.3 10*3/uL (ref 0.7–4.0)
MCH: 29.2 pg (ref 26.0–34.0)
MCHC: 32.8 g/dL (ref 30.0–36.0)
MCV: 89.2 fL (ref 78.0–100.0)
Monocytes Absolute: 0.6 10*3/uL (ref 0.1–1.0)
Monocytes Relative: 4 %
NEUTROS ABS: 10.9 10*3/uL — AB (ref 1.7–7.7)
Neutrophils Relative %: 79 %
PLATELETS: 327 10*3/uL (ref 150–400)
RBC: 4.62 MIL/uL (ref 3.87–5.11)
RDW: 13.1 % (ref 11.5–15.5)
WBC: 13.8 10*3/uL — ABNORMAL HIGH (ref 4.0–10.5)

## 2017-04-15 LAB — LIPASE, BLOOD: Lipase: 41 U/L (ref 11–51)

## 2017-04-15 LAB — POC URINE PREG, ED: Preg Test, Ur: NEGATIVE

## 2017-04-15 MED ORDER — FAMOTIDINE IN NACL 20-0.9 MG/50ML-% IV SOLN
20.0000 mg | Freq: Once | INTRAVENOUS | Status: AC
  Administered 2017-04-15: 20 mg via INTRAVENOUS
  Filled 2017-04-15: qty 50

## 2017-04-15 MED ORDER — PREDNISONE 10 MG PO TABS: ORAL_TABLET | ORAL | 0 refills | Status: DC

## 2017-04-15 MED ORDER — DIPHENHYDRAMINE HCL 50 MG/ML IJ SOLN
50.0000 mg | Freq: Once | INTRAMUSCULAR | Status: AC
  Administered 2017-04-15: 50 mg via INTRAVENOUS
  Filled 2017-04-15: qty 1

## 2017-04-15 MED ORDER — DEXAMETHASONE SODIUM PHOSPHATE 10 MG/ML IJ SOLN
10.0000 mg | Freq: Once | INTRAMUSCULAR | Status: AC
  Administered 2017-04-15: 10 mg via INTRAVENOUS
  Filled 2017-04-15: qty 1

## 2017-04-15 MED ORDER — EPINEPHRINE 0.3 MG/0.3ML IJ SOAJ: 0.3000 mg | Freq: Once | INTRAMUSCULAR | 0 refills | Status: AC

## 2017-04-15 MED ORDER — FAMOTIDINE 20 MG PO TABS: 20.0000 mg | ORAL_TABLET | Freq: Two times a day (BID) | ORAL | 0 refills | Status: DC

## 2017-04-15 NOTE — ED Triage Notes (Addendum)
Pt c/o itchy rash and lip swelling. States two days ago she had swelling around her eyes and felt like her throat was swollen. States she ate kiwi for the first time 3 days ago. Took benadryl with no relief.  NAD noted. VSS, airway patent.

## 2017-04-15 NOTE — Discharge Instructions (Signed)
Start your prednisone taper tomorrow evening.  Take Pepcid twice daily and continue using your Benadryl 50 mg every 6 hours for at least the next 3 days, tapering off the Benadryl if your symptoms have completely resolved, although if they return go back on the Benadryl.  Complete the other 2 medicines until gone.  Return here or see your doctor for any persistent or worsening symptoms.  As discussed, I am suspicious of the [place] is the source of your allergy, especially since he had the same reaction the last time you use the soap.  Do not use this soap.  Call your doctor for recheck and to consider allergy testing.  You have been prescribed an EpiPen which you should carry with you at all times for use in case you have a severe reaction involving shortness of breath, wheezing or mouth throat or tongue swelling.

## 2017-04-15 NOTE — ED Provider Notes (Signed)
[facility] EMERGENCY DEPARTMENT Provider Note   CSN: [id number] Arrival date & time: 04/15/17  1433     History   Chief Complaint Chief Complaint  Patient presents with  . Allergic Reaction    HPI Katie Roberson is a 27 y.o. female with a history of PCO S, gallstones and chronic pelvic pain presenting with 2 complaints, the first being hives which have been waxing and waning, currently on her back and abdomen in association with swelling of her hands, neck (currently resolved), periorbital and lip for the past 3 days.  She felt like her throat was swollen 2 days ago which has resolved at this time.  She states she ate a Kiwi fruit 3 days ago which is a new food for her, but also states she bathed that morning with [place] soap, stating the last time she used this particular brand she was a teenager and had similar reaction.  She has taken Benadryl 50 mg doses every 6 hours, last dose was early this morning but she denies any significant improvement.  Secondly has developed sharp upper abdominal pain  since this afternoon and states ate a fatty meal earlier today.  Her symptoms remind her of her last gallbladder attack.  She denies nausea or vomiting.  No radiation of pain.  She denies diarrhea, dysuria or other abdominal complaints.  She was originally diagnosed with gallstones in 2015.  The history is provided by the patient.    Past Medical History:  Diagnosis Date  . Anxiety   . Chicken pox   . Chronic abdominal pain   . Chronic pelvic pain in female   . Dysmenorrhea   . Gallstones   . PCOS (polycystic ovarian syndrome)   . Vaginal pain    chronic    Patient Active Problem List   Diagnosis Date Noted  . Abnormal uterine bleeding (AUB) 02/08/2016  . Screening examination for STD (sexually transmitted disease) 02/08/2016  . Abnormal vaginal bleeding 01/29/2013  . PCO (polycystic ovaries) 12/26/2012  . DUB (dysfunctional uterine bleeding) 12/26/2012  . Morbid  obesity (HCC) 07/16/2012    History reviewed. No pertinent surgical history.  OB History    Gravida Para Term Preterm AB Living   0 0 0 0 0 0   SAB TAB Ectopic Multiple Live Births   0 0 0 0         Home Medications    Prior to Admission medications   Medication Sig Start Date End Date Taking? Authorizing Provider  famotidine (PEPCID) 20 MG tablet Take 1 tablet (20 mg total) by mouth 2 (two) times daily. 04/15/17   Burgess Amor, PA-C  medroxyPROGESTERone (DEPO-PROVERA) 150 MG/ML injection Inject 150 mg into the muscle every 3 (three) months.    [provider]  metroNIDAZOLE (FLAGYL) 500 MG tablet Take 1 tablet (500 mg total) by mouth 2 (two) times daily. 03/22/17   Triplett, Tammy, PA-C  predniSONE (DELTASONE) 10 MG tablet Take 6 tabs daily by mouth for 1 day,  Then 5 tabs daily for 2 days,  4 tabs daily for 2 days,  3 tabs daily for 2 days,  2 tabs daily for 2 days,  Then 1 tab daily for 2 days. 04/15/17   Burgess Amor, PA-C  ranitidine (ZANTAC) 150 MG tablet Take 1 tablet (150 mg total) by mouth 2 (two) times daily. Patient not taking: Reported on 11/07/2016 08/30/16 08/30/17  Willy Eddy, MD    Family History Family History  Problem Relation Age  of Onset  . Diabetes Paternal Grandmother     Social History Social History   Tobacco Use  . Smoking status: Former Smoker    Types: Cigarettes  . Smokeless tobacco: Never Used  Substance Use Topics  . Alcohol use: Yes    Comment: socially   . Drug use: No     Allergies   Patient has no known allergies.   Review of Systems Review of Systems  Constitutional: Negative for fever.  HENT: Positive for facial swelling. Negative for congestion and sore throat.   Eyes: Negative.   Respiratory: Negative for chest tightness, shortness of breath and stridor.   Cardiovascular: Negative for chest pain.  Gastrointestinal: Positive for abdominal pain. Negative for nausea and vomiting.  Genitourinary: Negative.     Musculoskeletal: Positive for back pain. Negative for arthralgias, joint swelling and neck pain.  Skin: Negative for wound.  Neurological: Negative for dizziness, weakness, light-headedness, numbness and headaches.  Psychiatric/Behavioral: Negative.      Physical Exam Updated Vital Signs BP 124/64   Pulse 80   Temp 98.6 F (37 C) (Oral)   Resp 18   Ht 5\' 8"  (1.727 m)   Wt (!) 137.9 kg (304 lb)   LMP  (LMP Unknown)   SpO2 98%   BMI 46.22 kg/m   Physical Exam  Constitutional: She appears well-developed and well-nourished.  HENT:  Head: Normocephalic and atraumatic.  Mouth/Throat: Oropharynx is clear and moist.  No obvious angioedema.  Eyes: Conjunctivae are normal.  Neck: Normal range of motion.  Cardiovascular: Normal rate, regular rhythm, normal heart sounds and intact distal pulses.  Pulmonary/Chest: Effort normal and breath sounds normal. No stridor. She has no wheezes.  Abdominal: Soft. Bowel sounds are normal. There is tenderness in the epigastric area. There is no rigidity, no rebound and no guarding.  Musculoskeletal: Normal range of motion.  Neurological: She is alert.  Skin: Skin is warm and dry. Rash noted. Rash is urticarial.  Hives on upper back to bra line, few scattered hives on abdomen.  Psychiatric: She has a normal mood and affect.  Nursing note and vitals reviewed.    ED Treatments / Results  Labs (all labs ordered are listed, but only abnormal results are displayed) Labs Reviewed  CBC WITH DIFFERENTIAL/PLATELET - Abnormal; Notable for the following components:      Result Value   WBC 13.8 (*)    Neutro Abs 10.9 (*)    All other components within normal limits  COMPREHENSIVE METABOLIC PANEL - Abnormal; Notable for the following components:   CO2 21 (*)    Glucose, Bld 101 (*)    Calcium 8.7 (*)    All other components within normal limits  URINALYSIS, ROUTINE W REFLEX MICROSCOPIC - Abnormal; Notable for the following components:   APPearance  HAZY (*)    Hgb urine dipstick MODERATE (*)    Leukocytes, UA TRACE (*)    Bacteria, UA RARE (*)    Squamous Epithelial / LPF 0-5 (*)    All other components within normal limits  LIPASE, BLOOD  POC URINE PREG, ED    EKG  EKG Interpretation None       Radiology No results found.  Procedures Procedures (including critical care time)  Medications Ordered in ED Medications  diphenhydrAMINE (BENADRYL) injection 50 mg (50 mg Intravenous Given 04/15/17 1935)  dexamethasone (DECADRON) injection 10 mg (10 mg Intravenous Given 04/15/17 1936)  famotidine (PEPCID) IVPB 20 mg premix (0 mg Intravenous Stopped 04/15/17 2005)  Initial Impression / Assessment and Plan / ED Course  I have reviewed the triage vital signs and the nursing notes.  Pertinent labs & imaging results that were available during my care of the patient were reviewed by me and considered in my medical decision making (see chart for details).     Pt with a now 3 day history of waxing/waning urticaria with reported lip edema, not currently with mouth, lip or facial sx. She was treated with decadron, benadryl and pepcid here. The hives on her back was almost resolved by time of dc. Also without abd pain at time of dc. She was provided with prednisone taper, continued pepcid and her home benadryl.  Also given prescription for epipen for use if sx become severe, discussed use of this medicine. Strict return precautions discussed. Advised to avoid [place] soap but also should f/u with pcp to consider allergy testing.  Pt discussed with Dr. Ethelda Chick prior to dc home.  Final Clinical Impressions(s) / ED Diagnoses   Final diagnoses:  Urticaria  Epigastric pain    ED Discharge Orders        Ordered    predniSONE (DELTASONE) 10 MG tablet     04/15/17 2104    famotidine (PEPCID) 20 MG tablet  2 times daily     04/15/17 2104    EPINEPHrine 0.3 mg/0.3 mL IJ SOAJ injection   Once     04/15/17 2109       Burgess Amor, Cordelia Poche 04/16/17 1255    Doug Sou, MD 04/16/17 1402

## 2017-11-12 ENCOUNTER — Other Ambulatory Visit: Payer: Self-pay

## 2017-11-12 ENCOUNTER — Encounter (HOSPITAL_COMMUNITY): Payer: Self-pay

## 2017-11-12 ENCOUNTER — Emergency Department (HOSPITAL_COMMUNITY): Payer: Self-pay

## 2017-11-12 ENCOUNTER — Emergency Department (HOSPITAL_COMMUNITY)
Admission: EM | Admit: 2017-11-12 | Discharge: 2017-11-12 | Disposition: A | Discharge status: Home / Self Care | Payer: Self-pay | Attending: Emergency Medicine | Admitting: Emergency Medicine

## 2017-11-12 DIAGNOSIS — R1031 Right lower quadrant pain: Secondary | ICD-10-CM | POA: Insufficient documentation

## 2017-11-12 DIAGNOSIS — Z79899 Other long term (current) drug therapy: Secondary | ICD-10-CM | POA: Insufficient documentation

## 2017-11-12 DIAGNOSIS — R1030 Lower abdominal pain, unspecified: Secondary | ICD-10-CM | POA: Insufficient documentation

## 2017-11-12 DIAGNOSIS — Z87891 Personal history of nicotine dependence: Secondary | ICD-10-CM | POA: Insufficient documentation

## 2017-11-12 DIAGNOSIS — R112 Nausea with vomiting, unspecified: Secondary | ICD-10-CM | POA: Insufficient documentation

## 2017-11-12 DIAGNOSIS — R1032 Left lower quadrant pain: Secondary | ICD-10-CM | POA: Insufficient documentation

## 2017-11-12 LAB — PREGNANCY, URINE: Preg Test, Ur: NEGATIVE

## 2017-11-12 LAB — URINALYSIS, ROUTINE W REFLEX MICROSCOPIC
Bilirubin Urine: NEGATIVE
GLUCOSE, UA: NEGATIVE mg/dL
Ketones, ur: NEGATIVE mg/dL
NITRITE: NEGATIVE
PH: 5 (ref 5.0–8.0)
Protein, ur: NEGATIVE mg/dL
SPECIFIC GRAVITY, URINE: 1.026 (ref 1.005–1.030)

## 2017-11-12 LAB — COMPREHENSIVE METABOLIC PANEL
ALBUMIN: 3.3 g/dL — AB (ref 3.5–5.0)
ALK PHOS: 75 U/L (ref 38–126)
ALT: 12 U/L (ref 0–44)
ANION GAP: 5 (ref 5–15)
AST: 21 U/L (ref 15–41)
BUN: 10 mg/dL (ref 6–20)
CALCIUM: 8.5 mg/dL — AB (ref 8.9–10.3)
CO2: 24 mmol/L (ref 22–32)
CREATININE: 0.98 mg/dL (ref 0.44–1.00)
Chloride: 111 mmol/L (ref 98–111)
GFR calc Af Amer: >60 mL/min (ref >60)
GFR calc non Af Amer: >60 mL/min (ref >60)
GLUCOSE: 87 mg/dL (ref 70–99)
Potassium: 3.8 mmol/L (ref 3.5–5.1)
Sodium: 140 mmol/L (ref 135–145)
TOTAL PROTEIN: 6.4 g/dL — AB (ref 6.5–8.1)
Total Bilirubin: 0.4 mg/dL (ref 0.3–1.2)

## 2017-11-12 LAB — LIPASE, BLOOD: Lipase: 45 U/L (ref 11–51)

## 2017-11-12 LAB — CBC
HCT: 43.3 % (ref 36.0–46.0)
HEMOGLOBIN: 14.8 g/dL (ref 12.0–15.0)
MCH: 30.8 pg (ref 26.0–34.0)
MCHC: 34.2 g/dL (ref 30.0–36.0)
MCV: 90 fL (ref 78.0–100.0)
PLATELETS: 284 10*3/uL (ref 150–400)
RBC: 4.81 MIL/uL (ref 3.87–5.11)
RDW: 13.3 % (ref 11.5–15.5)
WBC: 18.2 10*3/uL — ABNORMAL HIGH (ref 4.0–10.5)

## 2017-11-12 MED ORDER — SODIUM CHLORIDE 0.9 % IV BOLUS
1000.0000 mL | Freq: Once | INTRAVENOUS | Status: AC
  Administered 2017-11-12: 1000 mL via INTRAVENOUS

## 2017-11-12 MED ORDER — ONDANSETRON HCL 4 MG/2ML IJ SOLN
4.0000 mg | Freq: Once | INTRAMUSCULAR | Status: AC
Start: 2017-11-12 — End: 2017-11-12
  Administered 2017-11-12: 4 mg via INTRAVENOUS
  Filled 2017-11-12: qty 2

## 2017-11-12 MED ORDER — SODIUM CHLORIDE 0.9 % IV BOLUS: 30.0000 mL/kg | Freq: Once | INTRAVENOUS | Status: DC

## 2017-11-12 MED ORDER — IOPAMIDOL (ISOVUE-300) INJECTION 61%
100.0000 mL | Freq: Once | INTRAVENOUS | Status: AC | PRN
  Administered 2017-11-12: 100 mL via INTRAVENOUS

## 2017-11-12 MED ORDER — MORPHINE SULFATE (PF) 4 MG/ML IV SOLN
4.0000 mg | Freq: Once | INTRAVENOUS | Status: AC
  Administered 2017-11-12: 4 mg via INTRAVENOUS
  Filled 2017-11-12: qty 1

## 2017-11-12 NOTE — Discharge Instructions (Addendum)
You were evaluated in the emergency department for low abdominal pain.  You had blood work urinalysis and a CAT scan that did not show an obvious explanation for your pain.  You had problems like this before and so it would be important for you to follow-up with your GYN for further evaluation.  You can use Tylenol or ibuprofen for pain.  Return if any concerns.

## 2017-11-12 NOTE — ED Notes (Signed)
Drink given per pt's request.

## 2017-11-12 NOTE — ED Triage Notes (Signed)
Pt reports sharp abdominal pain for 2 days. Reports nausea . No vomiting/ diarrhea. Denies urinary symptoms

## 2017-11-12 NOTE — ED Provider Notes (Signed)
[facility] EMERGENCY DEPARTMENT Provider Note   CSN: [id number] Arrival date & time: 11/12/17  1253     History   Chief Complaint Chief Complaint  Patient presents with  . Abdominal Pain    HPI Stanton Kidneysmerelda L Daher is a 27 y.o. female.  She is present here with 2 days of lower abdominal sharp stabbing pain 8 out of 10 intensity associated with nausea and vomiting.  States she is never had anything like it before.  No diarrhea no constipation no urinary symptoms.  She uses Nexplanon for contraception prophylaxis.  Denies any vaginal discharge since a little bit of vaginal bleeding.  No fevers no chills.  No recent travel no unusual exposures.  No trauma.  The history is provided by the patient.  Abdominal Pain   This is a new problem. The current episode started 2 days ago. The problem occurs constantly. The problem has not changed since onset.The pain is associated with an unknown factor. The pain is located in the suprapubic region, RLQ and LLQ. The quality of the pain is sharp. The pain is at a severity of 8/10. Associated symptoms include nausea and vomiting. Pertinent negatives include fever, diarrhea, hematochezia, melena, constipation, dysuria, frequency, hematuria and headaches. Nothing aggravates the symptoms. Nothing relieves the symptoms.    Past Medical History:  Diagnosis Date  . Anxiety   . Chicken pox   . Chronic abdominal pain   . Chronic pelvic pain in female   . Dysmenorrhea   . Gallstones   . PCOS (polycystic ovarian syndrome)   . Vaginal pain    chronic    Patient Active Problem List   Diagnosis Date Noted  . Abnormal uterine bleeding (AUB) 02/08/2016  . Screening examination for STD (sexually transmitted disease) 02/08/2016  . Abnormal vaginal bleeding 01/29/2013  . PCO (polycystic ovaries) 12/26/2012  . DUB (dysfunctional uterine bleeding) 12/26/2012  . Morbid obesity (HCC) 07/16/2012    History reviewed. No pertinent surgical history.   OB  History    Gravida  0   Para  0   Term  0   Preterm  0   AB  0   Living  0     SAB  0   TAB  0   Ectopic  0   Multiple  0   Live Births               Home Medications    Prior to Admission medications   Medication Sig Start Date End Date Taking? Authorizing Provider  famotidine (PEPCID) 20 MG tablet Take 1 tablet (20 mg total) by mouth 2 (two) times daily. 04/15/17   Burgess AmorIdol, Julie, PA-C  medroxyPROGESTERone (DEPO-PROVERA) 150 MG/ML injection Inject 150 mg into the muscle every 3 (three) months.    [provider]  metroNIDAZOLE (FLAGYL) 500 MG tablet Take 1 tablet (500 mg total) by mouth 2 (two) times daily. 03/22/17   Triplett, Tammy, PA-C  predniSONE (DELTASONE) 10 MG tablet Take 6 tabs daily by mouth for 1 day,  Then 5 tabs daily for 2 days,  4 tabs daily for 2 days,  3 tabs daily for 2 days,  2 tabs daily for 2 days,  Then 1 tab daily for 2 days. 04/15/17   Burgess AmorIdol, Julie, PA-C  ranitidine (ZANTAC) 150 MG tablet Take 1 tablet (150 mg total) by mouth 2 (two) times daily. Patient not taking: Reported on 11/07/2016 08/30/16 08/30/17  Willy Eddyobinson, Patrick, MD    Family History Family History  Problem Relation Age of Onset  . Diabetes Paternal Grandmother     Social History Social History   Tobacco Use  . Smoking status: Former Smoker    Types: Cigarettes  . Smokeless tobacco: Never Used  Substance Use Topics  . Alcohol use: Yes    Comment: socially   . Drug use: No     Allergies   Patient has no known allergies.   Review of Systems Review of Systems  Constitutional: Negative for fever.  HENT: Negative for sore throat.   Eyes: Negative for visual disturbance.  Respiratory: Negative for shortness of breath.   Cardiovascular: Negative for chest pain.  Gastrointestinal: Positive for abdominal pain, nausea and vomiting. Negative for constipation, diarrhea, hematochezia and melena.  Genitourinary: Positive for vaginal bleeding. Negative for dysuria,  frequency, hematuria and vaginal discharge.  Musculoskeletal: Negative for neck pain.  Skin: Negative for rash.  Neurological: Negative for headaches.     Physical Exam Updated Vital Signs BP 104/70 (BP Location: Right Arm)   Pulse 89   Temp 98 F (36.7 C) (Oral)   Resp 18   SpO2 100%   Physical Exam  Constitutional: She appears well-developed and well-nourished. No distress.  HENT:  Head: Normocephalic and atraumatic.  Eyes: Conjunctivae are normal.  Neck: Neck supple.  Cardiovascular: Normal rate and regular rhythm.  No murmur heard. Pulmonary/Chest: Effort normal and breath sounds normal. No respiratory distress.  Abdominal: Soft. There is tenderness in the right lower quadrant, periumbilical area, suprapubic area and left lower quadrant. There is no rigidity and no guarding.  She is morbidly obese.  She has a soft abdomen with no focal tenderness but diffuse tenderness lower abdomen.  No masses guarding or rebound.  Musculoskeletal: She exhibits no edema.  Neurological: She is alert.  Skin: Skin is warm and dry. Capillary refill takes less than 2 seconds.  Psychiatric: She has a normal mood and affect.  Nursing note and vitals reviewed.    ED Treatments / Results  Labs (all labs ordered are listed, but only abnormal results are displayed) Labs Reviewed  CBC - Abnormal; Notable for the following components:      Result Value   WBC 18.2 (*)    All other components within normal limits  URINALYSIS, ROUTINE W REFLEX MICROSCOPIC - Abnormal; Notable for the following components:   APPearance CLOUDY (*)    Hgb urine dipstick LARGE (*)    Leukocytes, UA TRACE (*)    Bacteria, UA RARE (*)    All other components within normal limits  LIPASE, BLOOD  COMPREHENSIVE METABOLIC PANEL  PREGNANCY, URINE    EKG None  Radiology Ct Abdomen Pelvis W Contrast  Result Date: 11/12/2017 CLINICAL DATA:  Sharp abdominal pain for 2 days.  Nausea. EXAM: CT ABDOMEN AND PELVIS WITH  CONTRAST TECHNIQUE: Multidetector CT imaging of the abdomen and pelvis was performed using the standard protocol following bolus administration of intravenous contrast. CONTRAST:  ISOVUE-300 IOPAMIDOL (ISOVUE-300) INJECTION 61% COMPARISON:  CT abdomen and pelvis November 07, 2016 FINDINGS: LOWER CHEST: Lung bases are clear. Included heart size is normal. No pericardial effusion. HEPATOBILIARY: Fat containing cholelithiasis measuring to 14 mm. No CT findings of acute cholecystitis. Normal liver. PANCREAS: Normal. SPLEEN: Normal. ADRENALS/URINARY TRACT: Kidneys are orthotopic, demonstrating symmetric enhancement. No nephrolithiasis, hydronephrosis or solid renal masses. The unopacified ureters are normal in course and caliber. Urinary bladder is partially distended and unremarkable. Normal adrenal glands. STOMACH/BOWEL: Small gas filled hiatal hernia. The stomach, small and large bowel  are normal in course and caliber without inflammatory changes. Mild colonic diverticulosis. Normal appendix. VASCULAR/LYMPHATIC: Aortoiliac vessels are normal in course and caliber. No lymphadenopathy by CT size criteria. REPRODUCTIVE: Normal. OTHER: Small volume free fluid in the pelvis is likely physiologic. No intraperitoneal free air or focal fluid collections. Small fat containing umbilical hernia. MUSCULOSKELETAL: Nonacute.  Sacroiliac osteoarthrosis. IMPRESSION: 1. Mild colonic diverticulosis without CT findings of acute diverticulitis nor acute intra-abdominal/pelvic process. 2. Cholelithiasis. Electronically Signed   By: Awilda Metro M.D.   On: 11/12/2017 16:29    Procedures Procedures (including critical care time)  Medications Ordered in ED Medications  sodium chloride 0.9 % bolus 30 mL/kg (has no administration in time range)  ondansetron (ZOFRAN) injection 4 mg (has no administration in time range)  morphine 4 MG/ML injection 4 mg (has no administration in time range)     Initial Impression /  Assessment and Plan / ED Course  I have reviewed the triage vital signs and the nursing notes.  Pertinent labs & imaging results that were available during my care of the patient were reviewed by me and considered in my medical decision making (see chart for details).  Clinical Course as of Nov 13 2330  Tue Nov 12, 2017  1520 On review of prior visits patient has been seen here for chronic low abdominal and pelvic pain since her teens.  It sounds like she has PCOS and also gallstones.  She had some screening labs done at triage that shows a white count of 18 so I feel obligated to continue down the pathway to work her up as we may find something this time.   [MB]  1652 Reviewed the patient's results with her.  She does say that now that she has had this pain before and they think it might be related to her ovaries.  She has a GYN and so I recommended that she make a follow-up appointment with them.  I also told her to keep an eye out for any other signs of fever without elevated white count but no particular source.   [MB]    Clinical Course User Index [MB] Terrilee Files, MD     Final Clinical Impressions(s) / ED Diagnoses   Final diagnoses:  Lower abdominal pain    ED Discharge Orders    None       Terrilee Files, MD 11/12/17 2333

## 2017-11-14 ENCOUNTER — Telehealth: Payer: Self-pay | Admitting: *Deleted

## 2017-11-14 NOTE — Telephone Encounter (Signed)
Called patient back and heard message that the number has been disconnected. I tried again and got same message. Will await patients return call.

## 2018-02-26 ENCOUNTER — Emergency Department (HOSPITAL_COMMUNITY)
Admission: EM | Admit: 2018-02-26 | Discharge: 2018-02-26 | Disposition: A | Discharge status: Home / Self Care | Payer: Self-pay | Attending: Emergency Medicine | Admitting: Emergency Medicine

## 2018-02-26 ENCOUNTER — Other Ambulatory Visit: Payer: Self-pay

## 2018-02-26 ENCOUNTER — Encounter (HOSPITAL_COMMUNITY): Payer: Self-pay

## 2018-02-26 ENCOUNTER — Emergency Department (HOSPITAL_COMMUNITY): Payer: Self-pay

## 2018-02-26 ENCOUNTER — Encounter (HOSPITAL_COMMUNITY): Payer: Self-pay | Admitting: Emergency Medicine

## 2018-02-26 DIAGNOSIS — R1011 Right upper quadrant pain: Secondary | ICD-10-CM | POA: Insufficient documentation

## 2018-02-26 DIAGNOSIS — Z6841 Body Mass Index (BMI) 40.0 and over, adult: Secondary | ICD-10-CM | POA: Insufficient documentation

## 2018-02-26 DIAGNOSIS — F419 Anxiety disorder, unspecified: Secondary | ICD-10-CM | POA: Insufficient documentation

## 2018-02-26 DIAGNOSIS — E282 Polycystic ovarian syndrome: Secondary | ICD-10-CM | POA: Insufficient documentation

## 2018-02-26 DIAGNOSIS — K8000 Calculus of gallbladder with acute cholecystitis without obstruction: Principal | ICD-10-CM | POA: Insufficient documentation

## 2018-02-26 DIAGNOSIS — Z87891 Personal history of nicotine dependence: Secondary | ICD-10-CM | POA: Insufficient documentation

## 2018-02-26 DIAGNOSIS — R1010 Upper abdominal pain, unspecified: Secondary | ICD-10-CM

## 2018-02-26 DIAGNOSIS — K821 Hydrops of gallbladder: Secondary | ICD-10-CM | POA: Insufficient documentation

## 2018-02-26 DIAGNOSIS — Z79899 Other long term (current) drug therapy: Secondary | ICD-10-CM | POA: Insufficient documentation

## 2018-02-26 LAB — COMPREHENSIVE METABOLIC PANEL
ALT: 19 U/L (ref 0–44)
AST: 28 U/L (ref 15–41)
Albumin: 3.8 g/dL (ref 3.5–5.0)
Alkaline Phosphatase: 94 U/L (ref 38–126)
Anion gap: 6 (ref 5–15)
BUN: 12 mg/dL (ref 6–20)
CO2: 23 mmol/L (ref 22–32)
CREATININE: 0.86 mg/dL (ref 0.44–1.00)
Calcium: 8.7 mg/dL — ABNORMAL LOW (ref 8.9–10.3)
Chloride: 108 mmol/L (ref 98–111)
GFR calc Af Amer: >60 mL/min (ref >60)
GFR calc non Af Amer: >60 mL/min (ref >60)
Glucose, Bld: 140 mg/dL — ABNORMAL HIGH (ref 70–99)
Potassium: 3.2 mmol/L — ABNORMAL LOW (ref 3.5–5.1)
Sodium: 137 mmol/L (ref 135–145)
Total Bilirubin: 0.4 mg/dL (ref 0.3–1.2)
Total Protein: 7.4 g/dL (ref 6.5–8.1)

## 2018-02-26 LAB — CBC WITH DIFFERENTIAL/PLATELET
ABS IMMATURE GRANULOCYTES: 0.2 10*3/uL — AB (ref 0.00–0.07)
Basophils Absolute: 0 10*3/uL (ref 0.0–0.1)
Basophils Relative: 0 %
Eosinophils Absolute: 0 10*3/uL (ref 0.0–0.5)
Eosinophils Relative: 0 %
HCT: 37.8 % (ref 36.0–46.0)
Hemoglobin: 12.6 g/dL (ref 12.0–15.0)
Immature Granulocytes: 1 %
Lymphocytes Relative: 7 %
Lymphs Abs: 1.2 10*3/uL (ref 0.7–4.0)
MCH: 29.6 pg (ref 26.0–34.0)
MCHC: 33.3 g/dL (ref 30.0–36.0)
MCV: 88.9 fL (ref 80.0–100.0)
Monocytes Absolute: 0.6 10*3/uL (ref 0.1–1.0)
Monocytes Relative: 3 %
NRBC: 0 % (ref 0.0–0.2)
Neutro Abs: 15.3 10*3/uL — ABNORMAL HIGH (ref 1.7–7.7)
Neutrophils Relative %: 89 %
Platelets: 271 10*3/uL (ref 150–400)
RBC: 4.25 MIL/uL (ref 3.87–5.11)
RDW: 12.9 % (ref 11.5–15.5)
WBC: 17.3 10*3/uL — ABNORMAL HIGH (ref 4.0–10.5)

## 2018-02-26 LAB — I-STAT BETA HCG BLOOD, ED (MC, WL, AP ONLY): I-stat hCG, quantitative: <5 m[IU]/mL (ref <5)

## 2018-02-26 LAB — URINALYSIS, ROUTINE W REFLEX MICROSCOPIC
Bilirubin Urine: NEGATIVE
Glucose, UA: NEGATIVE mg/dL
Hgb urine dipstick: NEGATIVE
Ketones, ur: 20 mg/dL — AB
Leukocytes, UA: NEGATIVE
Nitrite: NEGATIVE
Protein, ur: NEGATIVE mg/dL
SPECIFIC GRAVITY, URINE: 1.01 (ref 1.005–1.030)
pH: 7 (ref 5.0–8.0)

## 2018-02-26 MED ORDER — ONDANSETRON 4 MG PO TBDP: ORAL_TABLET | ORAL | 0 refills | Status: DC

## 2018-02-26 MED ORDER — HYDROMORPHONE HCL 1 MG/ML IJ SOLN
1.0000 mg | Freq: Once | INTRAMUSCULAR | Status: AC
  Administered 2018-02-26: 1 mg via INTRAVENOUS
  Filled 2018-02-26: qty 1

## 2018-02-26 MED ORDER — SODIUM CHLORIDE 0.9 % IV BOLUS
1000.0000 mL | Freq: Once | INTRAVENOUS | Status: AC
  Administered 2018-02-26: 1000 mL via INTRAVENOUS

## 2018-02-26 MED ORDER — CIPROFLOXACIN HCL 500 MG PO TABS: 500.0000 mg | ORAL_TABLET | Freq: Two times a day (BID) | ORAL | 0 refills | Status: DC

## 2018-02-26 MED ORDER — TRAMADOL HCL 50 MG PO TABS: 50.0000 mg | ORAL_TABLET | Freq: Four times a day (QID) | ORAL | 0 refills | Status: DC | PRN

## 2018-02-26 MED ORDER — POTASSIUM CHLORIDE CRYS ER 20 MEQ PO TBCR
40.0000 meq | EXTENDED_RELEASE_TABLET | Freq: Once | ORAL | Status: AC
  Administered 2018-02-26: 40 meq via ORAL
  Filled 2018-02-26: qty 2

## 2018-02-26 MED ORDER — CIPROFLOXACIN HCL 250 MG PO TABS
500.0000 mg | ORAL_TABLET | Freq: Once | ORAL | Status: AC
  Administered 2018-02-26: 500 mg via ORAL
  Filled 2018-02-26: qty 2

## 2018-02-26 MED ORDER — ONDANSETRON HCL 4 MG/2ML IJ SOLN
4.0000 mg | Freq: Once | INTRAMUSCULAR | Status: AC
  Administered 2018-02-26: 4 mg via INTRAVENOUS
  Filled 2018-02-26: qty 2

## 2018-02-26 NOTE — ED Notes (Signed)
Called Dr. Lovell Sheehan for Dr. Estell Harpin.

## 2018-02-26 NOTE — ED Provider Notes (Signed)
[facility] EMERGENCY DEPARTMENT Provider Note   CSN: [id number] Arrival date & time: 02/26/18  4098     History   Chief Complaint Chief Complaint  Patient presents with  . Abdominal Pain    HPI Katie Roberson is a 28 y.o. female.  Patient complains of abdominal pain.  She has a history of gallstones  The history is provided by the patient. No language interpreter was used.  Abdominal Pain  Pain location:  Epigastric Pain quality: aching   Pain radiates to:  Does not radiate Pain severity:  Moderate Onset quality:  Sudden Timing:  Constant Progression:  Waxing and waning Chronicity:  Recurrent Context: not alcohol use   Associated symptoms: no chest pain, no cough, no diarrhea, no fatigue and no hematuria     Past Medical History:  Diagnosis Date  . Anxiety   . Chicken pox   . Chronic abdominal pain   . Chronic pelvic pain in female   . Dysmenorrhea   . Gallstones   . PCOS (polycystic ovarian syndrome)   . Vaginal pain    chronic    Patient Active Problem List   Diagnosis Date Noted  . Abnormal uterine bleeding (AUB) 02/08/2016  . Screening examination for STD (sexually transmitted disease) 02/08/2016  . Abnormal vaginal bleeding 01/29/2013  . PCO (polycystic ovaries) 12/26/2012  . DUB (dysfunctional uterine bleeding) 12/26/2012  . Morbid obesity (HCC) 07/16/2012    History reviewed. No pertinent surgical history.   OB History    Gravida  0   Para  0   Term  0   Preterm  0   AB  0   Living  0     SAB  0   TAB  0   Ectopic  0   Multiple  0   Live Births               Home Medications    Prior to Admission medications   Medication Sig Start Date End Date Taking? Authorizing Provider  ciprofloxacin (CIPRO) 500 MG tablet Take 1 tablet (500 mg total) by mouth 2 (two) times daily. One po bid x 7 days 02/26/18   Bethann Berkshire, MD  famotidine (PEPCID) 20 MG tablet Take 1 tablet (20 mg total) by mouth 2 (two) times daily.  04/15/17   Burgess Amor, PA-C  medroxyPROGESTERone (DEPO-PROVERA) 150 MG/ML injection Inject 150 mg into the muscle every 3 (three) months.    [provider]  metroNIDAZOLE (FLAGYL) 500 MG tablet Take 1 tablet (500 mg total) by mouth 2 (two) times daily. 03/22/17   Triplett, Tammy, PA-C  ondansetron (ZOFRAN ODT) 4 MG disintegrating tablet 4mg  ODT q4 hours prn nausea/vomit 02/26/18   Bethann Berkshire, MD  predniSONE (DELTASONE) 10 MG tablet Take 6 tabs daily by mouth for 1 day,  Then 5 tabs daily for 2 days,  4 tabs daily for 2 days,  3 tabs daily for 2 days,  2 tabs daily for 2 days,  Then 1 tab daily for 2 days. 04/15/17   Burgess Amor, PA-C  ranitidine (ZANTAC) 150 MG tablet Take 1 tablet (150 mg total) by mouth 2 (two) times daily. Patient not taking: Reported on 11/07/2016 08/30/16 08/30/17  Willy Eddy, MD  traMADol (ULTRAM) 50 MG tablet Take 1 tablet (50 mg total) by mouth every 6 (six) hours as needed. 02/26/18   Bethann Berkshire, MD    Family History Family History  Problem Relation Age of Onset  . Diabetes Paternal Grandmother  Social History Social History   Tobacco Use  . Smoking status: Former Smoker    Types: Cigarettes  . Smokeless tobacco: Never Used  Substance Use Topics  . Alcohol use: Yes    Comment: socially   . Drug use: No     Allergies   Patient has no known allergies.   Review of Systems Review of Systems  Constitutional: Negative for appetite change and fatigue.  HENT: Negative for congestion, ear discharge and sinus pressure.   Eyes: Negative for discharge.  Respiratory: Negative for cough.   Cardiovascular: Negative for chest pain.  Gastrointestinal: Positive for abdominal pain. Negative for diarrhea.  Genitourinary: Negative for frequency and hematuria.  Musculoskeletal: Negative for back pain.  Skin: Negative for rash.  Neurological: Negative for seizures and headaches.  Psychiatric/Behavioral: Negative for hallucinations.     Physical  Exam Updated Vital Signs BP 125/86 (BP Location: Left Arm)   Pulse 82   Temp 97.7 F (36.5 C) (Oral)   Resp 12   Ht 5\' 8"  (1.727 m)   Wt (!) 149.7 kg   SpO2 100%   BMI 50.18 kg/m   Physical Exam Constitutional:      Appearance: She is well-developed.  HENT:     Head: Normocephalic.  Eyes:     General: No scleral icterus.    Conjunctiva/sclera: Conjunctivae normal.  Neck:     Musculoskeletal: Neck supple.     Thyroid: No thyromegaly.  Cardiovascular:     Rate and Rhythm: Normal rate and regular rhythm.     Heart sounds: No murmur. No friction rub. No gallop.   Pulmonary:     Breath sounds: No stridor. No wheezing or rales.  Chest:     Chest wall: No tenderness.  Abdominal:     General: There is no distension.     Tenderness: There is abdominal tenderness. There is no rebound.     Comments: Moderate right upper quadrant tenderness  Musculoskeletal: Normal range of motion.  Lymphadenopathy:     Cervical: No cervical adenopathy.  Skin:    Findings: No erythema or rash.  Neurological:     Mental Status: She is oriented to person, place, and time.     Motor: No abnormal muscle tone.     Coordination: Coordination normal.  Psychiatric:        Behavior: Behavior normal.      ED Treatments / Results  Labs (all labs ordered are listed, but only abnormal results are displayed) Labs Reviewed  CBC WITH DIFFERENTIAL/PLATELET - Abnormal; Notable for the following components:      Result Value   WBC 17.3 (*)    Neutro Abs 15.3 (*)    Abs Immature Granulocytes 0.20 (*)    All other components within normal limits  COMPREHENSIVE METABOLIC PANEL - Abnormal; Notable for the following components:   Potassium 3.2 (*)    Glucose, Bld 140 (*)    Calcium 8.7 (*)    All other components within normal limits  URINALYSIS, ROUTINE W REFLEX MICROSCOPIC  I-STAT BETA HCG BLOOD, ED ([facility], [facility], [facility] ONLY)    EKG None  Radiology [place] Abdomen Complete  Result Date: 02/26/2018 CLINICAL  DATA:  Abdominal pain EXAM: ABDOMEN ULTRASOUND COMPLETE COMPARISON:  11/12/2017 abdominal CT FINDINGS: Gallbladder: Multiple shadowing gallstones. No gallbladder wall thickening or focal tenderness. Common bile duct: Diameter: 4 mm. Where visualized, no filling defect. Liver: No focal lesion identified. Within normal limits in parenchymal echogenicity. Portal vein is patent on color Doppler imaging  with normal direction of blood flow towards the liver. IVC: No abnormality visualized. Pancreas: Visualized portion unremarkable. Spleen: Size and appearance within normal limits. Right Kidney: Length: 10 cm. Echogenicity within normal limits. No mass or hydronephrosis visualized. Left Kidney: Length: 11 cm. Echogenicity within normal limits. No mass or hydronephrosis visualized. Abdominal aorta: No aneurysm visualized. The distal aorta is obscured by bowel gas. IMPRESSION: 1. Cholelithiasis. 2. No acute finding Electronically Signed   By: Marnee SpringJonathon  Watts M.D.   On: 02/26/2018 09:18    Procedures Procedures (including critical care time)  Medications Ordered in ED Medications  potassium chloride SA (K-DUR,KLOR-CON) CR tablet 40 mEq (has no administration in time range)  ciprofloxacin (CIPRO) tablet 500 mg (has no administration in time range)  sodium chloride 0.9 % bolus 1,000 mL (0 mLs Intravenous Stopped 02/26/18 0845)  HYDROmorphone (DILAUDID) injection 1 mg (1 mg Intravenous Given 02/26/18 0755)  ondansetron (ZOFRAN) injection 4 mg (4 mg Intravenous Given 02/26/18 0754)     Initial Impression / Assessment and Plan / ED Course  I have reviewed the triage vital signs and the nursing notes.  Pertinent labs & imaging results that were available during my care of the patient were reviewed by me and considered in my medical decision making (see chart for details). Labs show white count 17,000 ultrasound shows gallstones but no cholecystitis.  Patient improved with treatment I spoke with the general surgeon Dr.  Lovell SheehanJenkins and he will follow-up the patient this week in his office.  The patient will be placed on Ultram Cipro and Zofran.     Final Clinical Impressions(s) / ED Diagnoses   Final diagnoses:  Pain of upper abdomen    ED Discharge Orders         Ordered    ciprofloxacin (CIPRO) 500 MG tablet  2 times daily     02/26/18 1016    ondansetron (ZOFRAN ODT) 4 MG disintegrating tablet     02/26/18 1016    traMADol (ULTRAM) 50 MG tablet  Every 6 hours PRN     02/26/18 1018           Bethann BerkshireZammit, Kellan Raffield, MD 02/26/18 1024

## 2018-02-26 NOTE — ED Triage Notes (Signed)
Pt arrived by [facility] with abdominal pain, was seen here last night and diagnosed with gall stones. Pt states she was unable to get meds filled. Pt has n/v.

## 2018-02-26 NOTE — Discharge Instructions (Signed)
You have an appointment with Dr. Lovell Sheehan at 930 tomorrow morning in his office.  If he cannot make that appointment call their office and schedule another appointment this week.  You need to be seen this week for reevaluation.  If you get worse you come back to the emergency department

## 2018-02-26 NOTE — ED Notes (Signed)
Pt would like something for nausea. RN notified.

## 2018-02-26 NOTE — ED Triage Notes (Signed)
Pt c/o abd pain n/v since 3 yesterday.  Reports history of gall stones.  Denies diarrhea.  LBM was 2 or 3 days ago.

## 2018-02-27 ENCOUNTER — Observation Stay (HOSPITAL_COMMUNITY)
Admission: EM | Admit: 2018-02-27 | Discharge: 2018-03-01 | Disposition: A | Discharge status: Home / Self Care | Payer: Self-pay | Attending: General Surgery | Admitting: General Surgery

## 2018-02-27 ENCOUNTER — Emergency Department (HOSPITAL_COMMUNITY): Payer: Self-pay

## 2018-02-27 DIAGNOSIS — K8000 Calculus of gallbladder with acute cholecystitis without obstruction: Secondary | ICD-10-CM

## 2018-02-27 DIAGNOSIS — K810 Acute cholecystitis: Secondary | ICD-10-CM | POA: Insufficient documentation

## 2018-02-27 DIAGNOSIS — K819 Cholecystitis, unspecified: Secondary | ICD-10-CM | POA: Diagnosis present

## 2018-02-27 LAB — CBC WITH DIFFERENTIAL/PLATELET
ABS IMMATURE GRANULOCYTES: 0.12 10*3/uL — AB (ref 0.00–0.07)
Basophils Absolute: 0 10*3/uL (ref 0.0–0.1)
Basophils Relative: 0 %
Eosinophils Absolute: 0 10*3/uL (ref 0.0–0.5)
Eosinophils Relative: 0 %
HCT: 41.9 % (ref 36.0–46.0)
HEMOGLOBIN: 13.6 g/dL (ref 12.0–15.0)
Immature Granulocytes: 1 %
LYMPHS PCT: 12 %
Lymphs Abs: 2.3 10*3/uL (ref 0.7–4.0)
MCH: 29.6 pg (ref 26.0–34.0)
MCHC: 32.5 g/dL (ref 30.0–36.0)
MCV: 91.3 fL (ref 80.0–100.0)
Monocytes Absolute: 1.1 10*3/uL — ABNORMAL HIGH (ref 0.1–1.0)
Monocytes Relative: 6 %
Neutro Abs: 15.4 10*3/uL — ABNORMAL HIGH (ref 1.7–7.7)
Neutrophils Relative %: 81 %
Platelets: 332 10*3/uL (ref 150–400)
RBC: 4.59 MIL/uL (ref 3.87–5.11)
RDW: 13 % (ref 11.5–15.5)
WBC: 19.1 10*3/uL — ABNORMAL HIGH (ref 4.0–10.5)
nRBC: 0 % (ref 0.0–0.2)

## 2018-02-27 LAB — COMPREHENSIVE METABOLIC PANEL
ALT: 20 U/L (ref 0–44)
AST: 30 U/L (ref 15–41)
Albumin: 4.1 g/dL (ref 3.5–5.0)
Alkaline Phosphatase: 106 U/L (ref 38–126)
Anion gap: 8 (ref 5–15)
BUN: 7 mg/dL (ref 6–20)
CO2: 24 mmol/L (ref 22–32)
CREATININE: 0.87 mg/dL (ref 0.44–1.00)
Calcium: 9.2 mg/dL (ref 8.9–10.3)
Chloride: 106 mmol/L (ref 98–111)
GFR calc Af Amer: >60 mL/min (ref >60)
GFR calc non Af Amer: >60 mL/min (ref >60)
Glucose, Bld: 95 mg/dL (ref 70–99)
Potassium: 3.4 mmol/L — ABNORMAL LOW (ref 3.5–5.1)
Sodium: 138 mmol/L (ref 135–145)
Total Bilirubin: 1 mg/dL (ref 0.3–1.2)
Total Protein: 8.1 g/dL (ref 6.5–8.1)

## 2018-02-27 LAB — LIPASE, BLOOD: Lipase: 43 U/L (ref 11–51)

## 2018-02-27 MED ORDER — CHLORHEXIDINE GLUCONATE CLOTH 2 % EX PADS
6.0000 | MEDICATED_PAD | Freq: Once | CUTANEOUS | Status: AC
  Administered 2018-02-28: 6 via TOPICAL

## 2018-02-27 MED ORDER — ACETAMINOPHEN 325 MG PO TABS: 650.0000 mg | ORAL_TABLET | Freq: Four times a day (QID) | ORAL | Status: DC | PRN

## 2018-02-27 MED ORDER — SODIUM CHLORIDE 0.9 % IV SOLN
INTRAVENOUS | Status: DC
  Administered 2018-02-27 – 2018-02-28 (×2): via INTRAVENOUS

## 2018-02-27 MED ORDER — MORPHINE SULFATE (PF) 4 MG/ML IV SOLN
4.0000 mg | INTRAVENOUS | Status: DC | PRN
  Administered 2018-02-27 – 2018-02-28 (×4): 4 mg via INTRAVENOUS
  Filled 2018-02-27 (×4): qty 1

## 2018-02-27 MED ORDER — SODIUM CHLORIDE 0.9 % IV SOLN
2.0000 g | INTRAVENOUS | Status: DC
  Administered 2018-02-27 – 2018-02-28 (×2): 2 g via INTRAVENOUS
  Filled 2018-02-27: qty 20
  Filled 2018-02-27 (×2): qty 2
  Filled 2018-02-27 (×2): qty 20

## 2018-02-27 MED ORDER — ONDANSETRON HCL 4 MG/2ML IJ SOLN
4.0000 mg | Freq: Once | INTRAMUSCULAR | Status: AC
  Administered 2018-02-27: 4 mg via INTRAVENOUS
  Filled 2018-02-27: qty 2

## 2018-02-27 MED ORDER — IOPAMIDOL (ISOVUE-300) INJECTION 61%
100.0000 mL | Freq: Once | INTRAVENOUS | Status: AC | PRN
  Administered 2018-02-27: 100 mL via INTRAVENOUS

## 2018-02-27 MED ORDER — MORPHINE SULFATE (PF) 4 MG/ML IV SOLN
4.0000 mg | Freq: Once | INTRAVENOUS | Status: AC
  Administered 2018-02-27: 4 mg via INTRAVENOUS
  Filled 2018-02-27: qty 1

## 2018-02-27 MED ORDER — SODIUM CHLORIDE 0.9 % IV BOLUS
1000.0000 mL | Freq: Once | INTRAVENOUS | Status: AC
  Administered 2018-02-27: 1000 mL via INTRAVENOUS

## 2018-02-27 MED ORDER — MUPIROCIN 2 % EX OINT: 1.0000 "application " | TOPICAL_OINTMENT | Freq: Two times a day (BID) | CUTANEOUS | Status: DC

## 2018-02-27 NOTE — ED Provider Notes (Signed)
[facility] EMERGENCY DEPARTMENT Provider Note   CSN: [id number] Arrival date & time: 02/26/18  2207     History   Chief Complaint Chief Complaint  Patient presents with  . Abdominal Pain    HPI ZYONA MURLEY is a 28 y.o. female.  Patient is a 28 year old female with history of PCOS, obesity, and gallstones.  She presents today with complaints of abdominal pain.  She was seen yesterday with similar complaints and diagnosed with gallstones.  She was referred to a surgeon, however her pain became worse and she returns to the ER.  She reports nausea and vomiting.  She denies any fevers or chills.  She was prescribed medication, however did not get these filled because she did not have the money.  The history is provided by the patient.  Abdominal Pain  Pain location:  Epigastric and RUQ Pain quality: cramping   Pain radiates to:  Does not radiate Pain severity:  Moderate Timing:  Constant Progression:  Worsening Context: not alcohol use   Relieved by:  Nothing Worsened by:  Nothing   Past Medical History:  Diagnosis Date  . Anxiety   . Chicken pox   . Chronic abdominal pain   . Chronic pelvic pain in female   . Dysmenorrhea   . Gallstones   . PCOS (polycystic ovarian syndrome)   . Vaginal pain    chronic    Patient Active Problem List   Diagnosis Date Noted  . Abnormal uterine bleeding (AUB) 02/08/2016  . Screening examination for STD (sexually transmitted disease) 02/08/2016  . Abnormal vaginal bleeding 01/29/2013  . PCO (polycystic ovaries) 12/26/2012  . DUB (dysfunctional uterine bleeding) 12/26/2012  . Morbid obesity (HCC) 07/16/2012    History reviewed. No pertinent surgical history.   OB History    Gravida  0   Para  0   Term  0   Preterm  0   AB  0   Living  0     SAB  0   TAB  0   Ectopic  0   Multiple  0   Live Births               Home Medications    Prior to Admission medications   Medication Sig Start Date End  Date Taking? Authorizing Provider  acetaminophen (TYLENOL) 325 MG tablet Take 650 mg by mouth every 6 (six) hours as needed.    [provider]  ciprofloxacin (CIPRO) 500 MG tablet Take 1 tablet (500 mg total) by mouth 2 (two) times daily. One po bid x 7 days 02/26/18   Bethann Berkshire, MD  ondansetron (ZOFRAN ODT) 4 MG disintegrating tablet 4mg  ODT q4 hours prn nausea/vomit 02/26/18   Bethann Berkshire, MD  traMADol (ULTRAM) 50 MG tablet Take 1 tablet (50 mg total) by mouth every 6 (six) hours as needed. 02/26/18   Bethann Berkshire, MD    Family History Family History  Problem Relation Age of Onset  . Diabetes Paternal Grandmother     Social History Social History   Tobacco Use  . Smoking status: Former Smoker    Types: Cigarettes  . Smokeless tobacco: Never Used  Substance Use Topics  . Alcohol use: Yes    Comment: socially   . Drug use: No     Allergies   Patient has no known allergies.   Review of Systems Review of Systems  Gastrointestinal: Positive for abdominal pain.  All other systems reviewed and are negative.  Physical Exam Updated Vital Signs BP (!) 135/93 (BP Location: Right Arm)   Pulse 80   Temp 98.3 F (36.8 C) (Oral)   Resp 16   Ht 5\' 8"  (1.727 m)   Wt (!) 149.7 kg   SpO2 100%   BMI 50.17 kg/m   Physical Exam Vitals signs and nursing note reviewed.  Constitutional:      General: She is not in acute distress.    Appearance: She is well-developed. She is not diaphoretic.  HENT:     Head: Normocephalic and atraumatic.  Neck:     Musculoskeletal: Normal range of motion and neck supple.  Cardiovascular:     Rate and Rhythm: Normal rate and regular rhythm.     Heart sounds: No murmur. No friction rub. No gallop.   Pulmonary:     Effort: Pulmonary effort is normal. No respiratory distress.     Breath sounds: Normal breath sounds. No wheezing.  Abdominal:     General: Bowel sounds are normal. There is no distension.     Palpations: Abdomen is  soft.     Tenderness: There is abdominal tenderness in the right upper quadrant and epigastric area. There is no guarding or rebound. Negative signs include Murphy's sign.  Musculoskeletal: Normal range of motion.  Skin:    General: Skin is warm and dry.  Neurological:     Mental Status: She is alert and oriented to person, place, and time.      ED Treatments / Results  Labs (all labs ordered are listed, but only abnormal results are displayed) Labs Reviewed  COMPREHENSIVE METABOLIC PANEL  LIPASE, BLOOD  CBC WITH DIFFERENTIAL/PLATELET    EKG None  Radiology US Abdomen Complete  Result Date: 02/26/2018 CLINICAL DATA:  Abdominal pain EXAM: ABDOMEN ULTRASOUND COMPLETE COMPARISON:  11/12/2017 abdominal CT FINDINGS: Gallbladder: Multiple shadowing gallstones. No gallbladder wall thickening or focal tenderness. Common bile duct: Diameter: 4 mm. Where visualized, no filling defect. Liver: No focal lesion identified. Within normal limits in parenchymal echogenicity. Portal vein is patent on color Doppler imaging with normal direction of blood flow towards the liver. IVC: No abnormality visualized. Pancreas: Visualized portion unremarkable. Spleen: Size and appearance within normal limits. Right Kidney: Length: 10 cm. Echogenicity within normal limits. No mass or hydronephrosis visualized. Left Kidney: Length: 11 cm. Echogenicity within normal limits. No mass or hydronephrosis visualized. Abdominal aorta: No aneurysm visualized. The distal aorta is obscured by bowel gas. IMPRESSION: 1. Cholelithiasis. 2. No acute finding Electronically Signed   By: Marnee Spring M.D.   On: 02/26/2018 09:18    Procedures Procedures (including critical care time)  Medications Ordered in ED Medications  sodium chloride 0.9 % bolus 1,000 mL (has no administration in time range)  ondansetron (ZOFRAN) injection 4 mg (has no administration in time range)  morphine 4 MG/ML injection 4 mg (has no administration in  time range)     Initial Impression / Assessment and Plan / ED Course  I have reviewed the triage vital signs and the nursing notes.  Pertinent labs & imaging results that were available during my care of the patient were reviewed by me and considered in my medical decision making (see chart for details).  Patient presenting with complaints of right upper quadrant pain.  She was seen yesterday with similar complaints.  Ultrasound showed gallstones, however no evidence for cholecystitis or ductal dilatation.  He returns today with ongoing pain and vomiting.  CT scan today shows mild thickening of the gallbladder with  pericholecystic fluid, suggestive of possible acute cholecystitis.  Her white count has also increased from 17-19,000.  Patient's care was discussed with Dr. Lovell SheehanJenkins from general surgery.  He is requesting the patient be admitted by the hospitalist.  He will consult in the morning and likely remove her gallbladder.  Final Clinical Impressions(s) / ED Diagnoses   Final diagnoses:  None    ED Discharge Orders    None       Geoffery Lyonselo, Artin Mceuen, MD 02/27/18 [phone]

## 2018-02-27 NOTE — H&P (Signed)
History and Physical    Katie Roberson LKG:[id number] DOB: [birth year] DOA: 02/27/2018  I have briefly reviewed the patient's [facility]  PCP: Josefina DoLaney, Ronald B Jr., MD  Patient coming from: Home  Chief Complaint: Abdominal pain, nausea vomiting  HPI: Katie Roberson is a 28 y.o. female with medical history significant of anxiety, gallstones, PCOS, who presents to the hospital with chief complaint of abdominal pain as well as nausea and vomiting.  Patient has a known history of gallstones and tells me that she has been having intermittent nausea vomiting and right upper quadrant abdominal pain for several years.  She usually gets an episode once every 2 weeks, and this is associated with certain things that she eats or drinks.  She was evaluated in the hospital yesterday for similar symptoma, she was feeling a little bit better and was discharged home.  Her symptoms returned, and she was unable to eat and drink anything since yesterday.  She is also complaining of significant bandlike abdominal pain in the right upper quadrant midepigastric and left upper quadrant.  She has had profuse nausea and vomiting since yesterday.  She tells me she is never had symptoms this severe.  She denies any fevers however complains of subjective chills.  She denies any chest pain or palpitations.  She denies any shortness of breath.  ED Course: ED her vital signs are stable, CT scan of the abdomen and pelvis showed cholelithiasis with gallbladder wall thickening that was not seen by ultrasound yesterday, concerning for developing acute cholecystitis.  Blood work remarkable for a white count of 19.  General surgery was consulted and we are asked to admit  Review of Systems: As per HPI otherwise 10 point review of systems negative.   Past Medical History:  Diagnosis Date  . Anxiety   . Chicken pox   . Chronic abdominal pain   . Chronic pelvic pain in female   . Dysmenorrhea     . Gallstones   . PCOS (polycystic ovarian syndrome)   . Vaginal pain    chronic    History reviewed. No pertinent surgical history.   reports that she has quit smoking. Her smoking use included cigarettes. She has never used smokeless tobacco. She reports current alcohol use. She reports that she does not use drugs.  Allergies  Allergen Reactions  . Kiwi Extract     Patient is allergic to kiwi. Causes facial swelling    Family History  Problem Relation Age of Onset  . Diabetes Paternal Grandmother     Prior to Admission medications   Medication Sig Start Date End Date Taking? Authorizing Provider  acetaminophen (TYLENOL) 325 MG tablet Take 650 mg by mouth every 6 (six) hours as needed.   Yes [provider]  ciprofloxacin (CIPRO) 500 MG tablet Take 1 tablet (500 mg total) by mouth 2 (two) times daily. One po bid x 7 days Patient not taking: Reported on 02/27/2018 02/26/18   Bethann BerkshireZammit, Joseph, MD  ondansetron (ZOFRAN ODT) 4 MG disintegrating tablet 4mg  ODT q4 hours prn nausea/vomit Patient not taking: Reported on 02/27/2018 02/26/18   Bethann BerkshireZammit, Joseph, MD  traMADol (ULTRAM) 50 MG tablet Take 1 tablet (50 mg total) by mouth every 6 (six) hours as needed. 02/26/18   Bethann BerkshireZammit, Joseph, MD    Physical Exam: Vitals:   02/27/18 0830 02/27/18 0900 02/27/18 0930 02/27/18 1000  BP: 106/66 119/84 114/70 (!) 103/57  Pulse: 80 66 70 71  Resp:  Temp:      TempSrc:      SpO2: 100% 100% 99% 97%  Weight:      Height:        Constitutional: NAD, calm, comfortable Eyes: PERRL, lids and conjunctivae normal ENMT: Mucous membranes are moist. Posterior pharynx clear of any exudate or lesions. Neck: normal, supple, no masses, no thyromegaly Respiratory: clear to auscultation bilaterally, no wheezing, no crackles. Normal respiratory effort. No accessory muscle use.  Cardiovascular: Regular rate and rhythm, no murmurs / rubs / gallops. No extremity edema. 2+ pedal pulses.  Abdomen: Tenderness to  palpation in the right upper quadrant midepigastric and left upper quadrants, no guarding or rebound Musculoskeletal: no clubbing / cyanosis. Normal muscle tone.  Skin: no rashes, lesions, ulcers. No induration Neurologic: CN 2-12 grossly intact. Strength 5/5 in all 4.  Psychiatric: Normal judgment and insight. Alert and oriented x 3. Normal mood.   Labs on Admission: I have personally reviewed following labs and imaging studies  CBC: Recent Labs  Lab 02/26/18 0748 02/27/18 0415  WBC 17.3* 19.1*  NEUTROABS 15.3* 15.4*  HGB 12.6 13.6  HCT 37.8 41.9  MCV 88.9 91.3  PLT 271 332   Basic Metabolic Panel: Recent Labs  Lab 02/26/18 0748 02/27/18 0415  NA 137 138  K 3.2* 3.4*  CL 108 106  CO2 23 24  GLUCOSE 140* 95  BUN 12 7  CREATININE 0.86 0.87  CALCIUM 8.7* 9.2   GFR: Estimated Creatinine Clearance: 151.9 mL/min (by C-G formula based on SCr of 0.87 mg/dL). Liver Function Tests: Recent Labs  Lab 02/26/18 0748 02/27/18 0415  AST 28 30  ALT 19 20  ALKPHOS 94 106  BILITOT 0.4 1.0  PROT 7.4 8.1  ALBUMIN 3.8 4.1   Recent Labs  Lab 02/27/18 0415  LIPASE 43   No results for input(s): AMMONIA in the last 168 hours. Coagulation Profile: No results for input(s): INR, PROTIME in the last 168 hours. Cardiac Enzymes: No results for input(s): CKTOTAL, CKMB, CKMBINDEX, TROPONINI in the last 168 hours. BNP (last 3 results) No results for input(s): PROBNP in the last 8760 hours. HbA1C: No results for input(s): HGBA1C in the last 72 hours. CBG: No results for input(s): GLUCAP in the last 168 hours. Lipid Profile: No results for input(s): CHOL, HDL, LDLCALC, TRIG, CHOLHDL, LDLDIRECT in the last 72 hours. Thyroid Function Tests: No results for input(s): TSH, T4TOTAL, FREET4, T3FREE, THYROIDAB in the last 72 hours. Anemia Panel: No results for input(s): VITAMINB12, FOLATE, FERRITIN, TIBC, IRON, RETICCTPCT in the last 72 hours. Urine analysis:    Component Value Date/Time     COLORURINE YELLOW 02/26/2018 0748   APPEARANCEUR CLEAR 02/26/2018 0748   LABSPEC 1.010 02/26/2018 0748   PHURINE 7.0 02/26/2018 0748   GLUCOSEU NEGATIVE 02/26/2018 0748   HGBUR NEGATIVE 02/26/2018 0748   BILIRUBINUR NEGATIVE 02/26/2018 0748   KETONESUR 20 (A) 02/26/2018 0748   PROTEINUR NEGATIVE 02/26/2018 0748   UROBILINOGEN 0.2 06/06/2014 1310   NITRITE NEGATIVE 02/26/2018 0748   LEUKOCYTESUR NEGATIVE 02/26/2018 0748     Radiological Exams on Admission: [place] Abdomen Complete  Result Date: 02/26/2018 CLINICAL DATA:  Abdominal pain EXAM: ABDOMEN ULTRASOUND COMPLETE COMPARISON:  11/12/2017 abdominal CT FINDINGS: Gallbladder: Multiple shadowing gallstones. No gallbladder wall thickening or focal tenderness. Common bile duct: Diameter: 4 mm. Where visualized, no filling defect. Liver: No focal lesion identified. Within normal limits in parenchymal echogenicity. Portal vein is patent on color Doppler imaging with normal direction of blood flow towards the  liver. IVC: No abnormality visualized. Pancreas: Visualized portion unremarkable. Spleen: Size and appearance within normal limits. Right Kidney: Length: 10 cm. Echogenicity within normal limits. No mass or hydronephrosis visualized. Left Kidney: Length: 11 cm. Echogenicity within normal limits. No mass or hydronephrosis visualized. Abdominal aorta: No aneurysm visualized. The distal aorta is obscured by bowel gas. IMPRESSION: 1. Cholelithiasis. 2. No acute finding Electronically Signed   By: Marnee Spring M.D.   On: 02/26/2018 09:18   Ct Abdomen Pelvis W Contrast  Result Date: 02/27/2018 CLINICAL DATA:  Generalized acute abdominal pain. EXAM: CT ABDOMEN AND PELVIS WITH CONTRAST TECHNIQUE: Multidetector CT imaging of the abdomen and pelvis was performed using the standard protocol following bolus administration of intravenous contrast. CONTRAST:  ISOVUE-300 IOPAMIDOL (ISOVUE-300) INJECTION 61% COMPARISON:  11/12/2017 FINDINGS: Lower chest:   No contributory findings. Hepatobiliary: No focal liver abnormality.Cholelithiasis. Full gallbladder with wall thickening and mild pericholecystic edema not seen on prior ultrasound. Pancreas: Unremarkable. Spleen: Unremarkable. Adrenals/Urinary Tract: Negative adrenals. No hydronephrosis or stone. Unremarkable bladder. Stomach/Bowel:  No obstruction. No appendicitis. Vascular/Lymphatic: No acute vascular abnormality. No mass or adenopathy. Reproductive:No pathologic findings. Other: No ascites or pneumoperitoneum. Musculoskeletal: No acute abnormalities. IMPRESSION: Cholelithiasis with gallbladder wall thickening that was not seen by ultrasound yesterday. There could be developing acute cholecystitis. Otherwise negative. Electronically Signed   By: Marnee Spring M.D.   On: 02/27/2018 04:48    Assessment/Plan Active Problems:   Cholecystitis   Principal Problem Acute cholecystitis -General surgery consulted, discussed with Dr. Lovell Sheehan, appreciate input.  Looks like she will undergo cholecystectomy tomorrow. -Given concern for developing acute cholecystitis on imaging as well as elevated white count, start patient on IV antibiotics. -Symptomatic treatment for pain, Tylenol for fever  Active Problems Morbid obesity -She will need significant weight loss   PCOS -Outpatient management   DVT prophylaxis: SCDs Code Status: Full code Family Communication: Mother present at bedside Disposition Plan: Home after surgery Consults called: General surgery   Pamella Pert, MD, PhD [facility]  Contact via [email or web address]  [facility] Office Info P: [phone]  F: [phone]   02/27/2018, 11:23 AM

## 2018-02-27 NOTE — ED Notes (Signed)
Dr Lovell Sheehan at the bedside. Called Pharmacy for Rocephin, None in ED

## 2018-02-27 NOTE — Consult Note (Signed)
Reason for Consult: Right upper quadrant abdominal pain, cholelithiasis Referring Physician: Dr. Massie Kluver is an 28 y.o. female.  HPI: Patient is a 28 year old black female with a known history of cholelithiasis who presented to the emergency room yesterday with worsening right upper quadrant abdominal pain, nausea, and vomiting.  Ultrasound revealed cholelithiasis, but no evidence of acute cholecystitis.  Common bile duct was within normal limits.  LFTs were within normal limits.  She did have an leukocytosis.  She was discharged home on antibiotics and instructed to follow-up with my office today.  She returned back to the emergency room earlier this morning with worsening right upper quadrant abdominal pain and nausea.  Her liver enzyme test are within normal limits.  Her white blood cell count is 19,000.  It was elected to admit her to the hospital for IV antibiotics, control of her pain and nausea.  She states her pain is 6 out of 10 at the present time.  She has had intermittent episodes of right upper quadrant abdominal pain and nausea over the past 4 years.  She denies any fever, chills, jaundice.  Past Medical History:  Diagnosis Date  . Anxiety   . Chicken pox   . Chronic abdominal pain   . Chronic pelvic pain in female   . Dysmenorrhea   . Gallstones   . PCOS (polycystic ovarian syndrome)   . Vaginal pain    chronic    History reviewed. No pertinent surgical history.  Family History  Problem Relation Age of Onset  . Diabetes Paternal Grandmother     Social History:  reports that she has quit smoking. Her smoking use included cigarettes. She has never used smokeless tobacco. She reports current alcohol use. She reports that she does not use drugs.  Allergies: No Known Allergies  Medications: I have reviewed the patient's current medications.  Results for orders placed or performed during the hospital encounter of 02/27/18 (from the past 48 hour(s))   Comprehensive metabolic panel     Status: Abnormal   Collection Time: 02/27/18  4:15 AM  Result Value Ref Range   Sodium 138 135 - 145 mmol/L   Potassium 3.4 (L) 3.5 - 5.1 mmol/L   Chloride 106 98 - 111 mmol/L   CO2 24 22 - 32 mmol/L   Glucose, Bld 95 70 - 99 mg/dL   BUN 7 6 - 20 mg/dL   Creatinine, Ser 4.23 0.44 - 1.00 mg/dL   Calcium 9.2 8.9 - 95.3 mg/dL   Total Protein 8.1 6.5 - 8.1 g/dL   Albumin 4.1 3.5 - 5.0 g/dL   AST 30 15 - 41 U/L   ALT 20 0 - 44 U/L   Alkaline Phosphatase 106 38 - 126 U/L   Total Bilirubin 1.0 0.3 - 1.2 mg/dL   GFR calc non Af Amer >60 >60 mL/min   GFR calc Af Amer >60 >60 mL/min   Anion gap 8 5 - 15    Comment: Performed at [facility], [address], [place], [place] [address]  Lipase, blood     Status: None   Collection Time: 02/27/18  4:15 AM  Result Value Ref Range   Lipase 43 11 - 51 U/L    Comment: Performed at [facility], [address] [place], [place] [address]  CBC with Differential     Status: Abnormal   Collection Time: 02/27/18  4:15 AM  Result Value Ref Range   WBC 19.1 (H) 4.0 - 10.5 K/uL  RBC 4.59 3.87 - 5.11 MIL/uL   Hemoglobin 13.6 12.0 - 15.0 g/dL   HCT 99.2 42.6 - 83.4 %   MCV 91.3 80.0 - 100.0 fL   MCH 29.6 26.0 - 34.0 pg   MCHC 32.5 30.0 - 36.0 g/dL   RDW 19.6 22.2 - 97.9 %   Platelets 332 150 - 400 K/uL   nRBC 0.0 0.0 - 0.2 %   Neutrophils Relative % 81 %   Neutro Abs 15.4 (H) 1.7 - 7.7 K/uL   Lymphocytes Relative 12 %   Lymphs Abs 2.3 0.7 - 4.0 K/uL   Monocytes Relative 6 %   Monocytes Absolute 1.1 (H) 0.1 - 1.0 K/uL   Eosinophils Relative 0 %   Eosinophils Absolute 0.0 0.0 - 0.5 K/uL   Basophils Relative 0 %   Basophils Absolute 0.0 0.0 - 0.1 K/uL   Immature Granulocytes 1 %   Abs Immature Granulocytes 0.12 (H) 0.00 - 0.07 K/uL    Comment: Performed at [facility], [address] [place], [place] [address]    US Abdomen Complete  Result Date: 02/26/2018 CLINICAL DATA:  Abdominal pain EXAM: ABDOMEN  ULTRASOUND COMPLETE COMPARISON:  11/12/2017 abdominal CT FINDINGS: Gallbladder: Multiple shadowing gallstones. No gallbladder wall thickening or focal tenderness. Common bile duct: Diameter: 4 mm. Where visualized, no filling defect. Liver: No focal lesion identified. Within normal limits in parenchymal echogenicity. Portal vein is patent on color Doppler imaging with normal direction of blood flow towards the liver. IVC: No abnormality visualized. Pancreas: Visualized portion unremarkable. Spleen: Size and appearance within normal limits. Right Kidney: Length: 10 cm. Echogenicity within normal limits. No mass or hydronephrosis visualized. Left Kidney: Length: 11 cm. Echogenicity within normal limits. No mass or hydronephrosis visualized. Abdominal aorta: No aneurysm visualized. The distal aorta is obscured by bowel gas. IMPRESSION: 1. Cholelithiasis. 2. No acute finding Electronically Signed   By: Marnee Spring M.D.   On: 02/26/2018 09:18   Ct Abdomen Pelvis W Contrast  Result Date: 02/27/2018 CLINICAL DATA:  Generalized acute abdominal pain. EXAM: CT ABDOMEN AND PELVIS WITH CONTRAST TECHNIQUE: Multidetector CT imaging of the abdomen and pelvis was performed using the standard protocol following bolus administration of intravenous contrast. CONTRAST:  ISOVUE-300 IOPAMIDOL (ISOVUE-300) INJECTION 61% COMPARISON:  11/12/2017 FINDINGS: Lower chest:  No contributory findings. Hepatobiliary: No focal liver abnormality.Cholelithiasis. Full gallbladder with wall thickening and mild pericholecystic edema not seen on prior ultrasound. Pancreas: Unremarkable. Spleen: Unremarkable. Adrenals/Urinary Tract: Negative adrenals. No hydronephrosis or stone. Unremarkable bladder. Stomach/Bowel:  No obstruction. No appendicitis. Vascular/Lymphatic: No acute vascular abnormality. No mass or adenopathy. Reproductive:No pathologic findings. Other: No ascites or pneumoperitoneum. Musculoskeletal: No acute abnormalities.  IMPRESSION: Cholelithiasis with gallbladder wall thickening that was not seen by ultrasound yesterday. There could be developing acute cholecystitis. Otherwise negative. Electronically Signed   By: Marnee Spring M.D.   On: 02/27/2018 04:48    ROS:  Pertinent items are noted in HPI.  Blood pressure 124/77, pulse 69, temperature 98.3 F (36.8 C), temperature source Oral, resp. rate 20, height 5\' 8"  (1.727 m), weight (!) 149.7 kg, SpO2 100 %. Physical Exam: Pleasant obese black female no acute distress Head is normocephalic, atraumatic Eyes without scleral icterus Lungs clear to auscultation with equal breath sounds bilaterally Heart examination reveals regular rate and rhythm without S3, S4, murmurs Abdomen is soft with tenderness no in the right upper quadrant to palpation.  No hepatosplenomegaly, masses, or rigidity are noted.  Labs and x-ray results reviewed  Assessment/Plan: Impression:  Acute cholecystitis, cholelithiasis Plan: Patient will be admitted to the hospital for IV antibiotics, control of her pain and nausea, and IV hydration.  She was subsequently undergo a laparoscopic cholecystectomy tomorrow.  The risks and benefits of the procedure including bleeding, infection, hepatobiliary injury, and the possibility of an open procedure were fully explained to the patient, who gave informed consent.  Franky MachoMark Mette [place]gate 02/27/2018, 8:06 AM

## 2018-02-27 NOTE — ED Notes (Signed)
Katie Roberson (pts mother) states she would like to be updated when gen surg talks with pt, her contact information is : [phone]

## 2018-02-27 NOTE — ED Notes (Signed)
Called Day surgery, pt is on schedule for tomorrow. Waiting for bed on 300

## 2018-02-28 ENCOUNTER — Encounter (HOSPITAL_COMMUNITY)
Admission: EM | Disposition: A | Discharge status: Home / Self Care | Payer: Self-pay | Source: Home / Self Care | Attending: General Surgery

## 2018-02-28 ENCOUNTER — Encounter (HOSPITAL_COMMUNITY): Payer: Self-pay | Admitting: *Deleted

## 2018-02-28 ENCOUNTER — Observation Stay (HOSPITAL_COMMUNITY): Payer: Self-pay | Admitting: Anesthesiology

## 2018-02-28 DIAGNOSIS — K8000 Calculus of gallbladder with acute cholecystitis without obstruction: Principal | ICD-10-CM

## 2018-02-28 HISTORY — PX: CHOLECYSTECTOMY: SHX55

## 2018-02-28 LAB — BASIC METABOLIC PANEL
Anion gap: 8 (ref 5–15)
BUN: 6 mg/dL (ref 6–20)
CALCIUM: 8.4 mg/dL — AB (ref 8.9–10.3)
CO2: 23 mmol/L (ref 22–32)
Chloride: 108 mmol/L (ref 98–111)
Creatinine, Ser: 0.79 mg/dL (ref 0.44–1.00)
GFR calc Af Amer: >60 mL/min (ref >60)
GFR calc non Af Amer: >60 mL/min (ref >60)
Glucose, Bld: 88 mg/dL (ref 70–99)
Potassium: 3.3 mmol/L — ABNORMAL LOW (ref 3.5–5.1)
Sodium: 139 mmol/L (ref 135–145)

## 2018-02-28 LAB — CBC
HCT: 38.5 % (ref 36.0–46.0)
Hemoglobin: 12.4 g/dL (ref 12.0–15.0)
MCH: 29.2 pg (ref 26.0–34.0)
MCHC: 32.2 g/dL (ref 30.0–36.0)
MCV: 90.6 fL (ref 80.0–100.0)
Platelets: 296 10*3/uL (ref 150–400)
RBC: 4.25 MIL/uL (ref 3.87–5.11)
RDW: 12.9 % (ref 11.5–15.5)
WBC: 13 10*3/uL — ABNORMAL HIGH (ref 4.0–10.5)
nRBC: 0 % (ref 0.0–0.2)

## 2018-02-28 LAB — MRSA PCR SCREENING: MRSA by PCR: NEGATIVE

## 2018-02-28 LAB — SURGICAL PCR SCREEN
MRSA, PCR: NEGATIVE
Staphylococcus aureus: NEGATIVE

## 2018-02-28 SURGERY — LAPAROSCOPIC CHOLECYSTECTOMY
Anesthesia: General | Site: Abdomen

## 2018-02-28 MED ORDER — HYDROMORPHONE HCL 1 MG/ML IJ SOLN
1.0000 mg | INTRAMUSCULAR | Status: DC | PRN
  Administered 2018-02-28: 1 mg via INTRAVENOUS
  Filled 2018-02-28: qty 1

## 2018-02-28 MED ORDER — ROCURONIUM BROMIDE 10 MG/ML (PF) SYRINGE
PREFILLED_SYRINGE | INTRAVENOUS | Status: AC
  Filled 2018-02-28: qty 10

## 2018-02-28 MED ORDER — PROPOFOL 10 MG/ML IV BOLUS
INTRAVENOUS | Status: DC | PRN
  Administered 2018-02-28: 200 mg via INTRAVENOUS

## 2018-02-28 MED ORDER — PHENYLEPHRINE 40 MCG/ML (10ML) SYRINGE FOR IV PUSH (FOR BLOOD PRESSURE SUPPORT)
PREFILLED_SYRINGE | INTRAVENOUS | Status: AC
  Filled 2018-02-28: qty 10

## 2018-02-28 MED ORDER — SODIUM CHLORIDE 0.9 % IV SOLN
INTRAVENOUS | Status: DC
  Administered 2018-02-28 (×2): via INTRAVENOUS

## 2018-02-28 MED ORDER — MEPERIDINE HCL 50 MG/ML IJ SOLN: 6.2500 mg | INTRAMUSCULAR | Status: DC | PRN

## 2018-02-28 MED ORDER — MIDAZOLAM HCL 2 MG/2ML IJ SOLN
INTRAMUSCULAR | Status: AC
  Filled 2018-02-28: qty 2

## 2018-02-28 MED ORDER — LACTATED RINGERS IV SOLN
INTRAVENOUS | Status: DC
  Administered 2018-02-28: 08:00:00 via INTRAVENOUS

## 2018-02-28 MED ORDER — EPHEDRINE 5 MG/ML INJ
INTRAVENOUS | Status: AC
  Filled 2018-02-28: qty 10

## 2018-02-28 MED ORDER — HYDROCODONE-ACETAMINOPHEN 7.5-325 MG PO TABS: 1.0000 | ORAL_TABLET | Freq: Once | ORAL | Status: DC | PRN

## 2018-02-28 MED ORDER — ONDANSETRON HCL 4 MG/2ML IJ SOLN
4.0000 mg | Freq: Four times a day (QID) | INTRAMUSCULAR | Status: DC | PRN
  Administered 2018-02-28: 4 mg via INTRAVENOUS
  Filled 2018-02-28: qty 2

## 2018-02-28 MED ORDER — PHENYLEPHRINE HCL 10 MG/ML IJ SOLN
INTRAMUSCULAR | Status: DC | PRN
  Administered 2018-02-28: 80 ug via INTRAVENOUS

## 2018-02-28 MED ORDER — ONDANSETRON 4 MG PO TBDP
4.0000 mg | ORAL_TABLET | Freq: Four times a day (QID) | ORAL | Status: DC | PRN
  Administered 2018-03-01: 4 mg via ORAL
  Filled 2018-02-28: qty 1

## 2018-02-28 MED ORDER — ENOXAPARIN SODIUM 80 MG/0.8ML ~~LOC~~ SOLN
75.0000 mg | SUBCUTANEOUS | Status: DC
  Administered 2018-03-01: 75 mg via SUBCUTANEOUS
  Filled 2018-02-28: qty 0.8

## 2018-02-28 MED ORDER — SUGAMMADEX SODIUM 500 MG/5ML IV SOLN
INTRAVENOUS | Status: AC
  Filled 2018-02-28: qty 5

## 2018-02-28 MED ORDER — ACETAMINOPHEN 650 MG RE SUPP: 650.0000 mg | Freq: Four times a day (QID) | RECTAL | Status: DC | PRN

## 2018-02-28 MED ORDER — ROCURONIUM BROMIDE 100 MG/10ML IV SOLN
INTRAVENOUS | Status: DC | PRN
  Administered 2018-02-28: 35 mg via INTRAVENOUS

## 2018-02-28 MED ORDER — 0.9 % SODIUM CHLORIDE (POUR BTL) OPTIME
TOPICAL | Status: DC | PRN
  Administered 2018-02-28: 1000 mL

## 2018-02-28 MED ORDER — FENTANYL CITRATE (PF) 100 MCG/2ML IJ SOLN
INTRAMUSCULAR | Status: DC | PRN
  Administered 2018-02-28: 100 ug via INTRAVENOUS
  Administered 2018-02-28 (×2): 50 ug via INTRAVENOUS

## 2018-02-28 MED ORDER — ONDANSETRON HCL 4 MG/2ML IJ SOLN
INTRAMUSCULAR | Status: DC | PRN
  Administered 2018-02-28: 4 mg via INTRAVENOUS

## 2018-02-28 MED ORDER — KETOROLAC TROMETHAMINE 30 MG/ML IJ SOLN
30.0000 mg | Freq: Four times a day (QID) | INTRAMUSCULAR | Status: AC
  Administered 2018-02-28: 30 mg via INTRAVENOUS
  Filled 2018-02-28: qty 1

## 2018-02-28 MED ORDER — DIPHENHYDRAMINE HCL 50 MG/ML IJ SOLN: 25.0000 mg | Freq: Four times a day (QID) | INTRAMUSCULAR | Status: DC | PRN

## 2018-02-28 MED ORDER — ENOXAPARIN SODIUM 40 MG/0.4ML ~~LOC~~ SOLN: 40.0000 mg | SUBCUTANEOUS | Status: DC

## 2018-02-28 MED ORDER — SODIUM CHLORIDE 0.9 % IV SOLN: 2.0000 g | INTRAVENOUS | Status: DC

## 2018-02-28 MED ORDER — LORAZEPAM 2 MG/ML IJ SOLN: 1.0000 mg | INTRAMUSCULAR | Status: DC | PRN

## 2018-02-28 MED ORDER — PROPOFOL 10 MG/ML IV BOLUS
INTRAVENOUS | Status: AC
  Filled 2018-02-28: qty 40

## 2018-02-28 MED ORDER — EPHEDRINE SULFATE 50 MG/ML IJ SOLN
INTRAMUSCULAR | Status: DC | PRN
  Administered 2018-02-28: 15 mg via INTRAVENOUS

## 2018-02-28 MED ORDER — BUPIVACAINE LIPOSOME 1.3 % IJ SUSP
INTRAMUSCULAR | Status: DC | PRN
  Administered 2018-02-28: 20 mL

## 2018-02-28 MED ORDER — SIMETHICONE 80 MG PO CHEW: 40.0000 mg | CHEWABLE_TABLET | Freq: Four times a day (QID) | ORAL | Status: DC | PRN

## 2018-02-28 MED ORDER — SUCCINYLCHOLINE CHLORIDE 20 MG/ML IJ SOLN
INTRAMUSCULAR | Status: DC | PRN
  Administered 2018-02-28: 140 mg via INTRAVENOUS

## 2018-02-28 MED ORDER — ENOXAPARIN SODIUM 40 MG/0.4ML ~~LOC~~ SOLN
40.0000 mg | Freq: Once | SUBCUTANEOUS | Status: AC
  Administered 2018-02-28: 40 mg via SUBCUTANEOUS
  Filled 2018-02-28: qty 0.4

## 2018-02-28 MED ORDER — PROPOFOL 10 MG/ML IV BOLUS: INTRAVENOUS | Status: DC | PRN

## 2018-02-28 MED ORDER — PROMETHAZINE HCL 25 MG/ML IJ SOLN: 6.2500 mg | INTRAMUSCULAR | Status: DC | PRN

## 2018-02-28 MED ORDER — HYDROMORPHONE HCL 1 MG/ML IJ SOLN
0.2500 mg | INTRAMUSCULAR | Status: DC | PRN
  Administered 2018-02-28 (×2): 0.5 mg via INTRAVENOUS
  Filled 2018-02-28 (×3): qty 0.5

## 2018-02-28 MED ORDER — OXYCODONE-ACETAMINOPHEN 5-325 MG PO TABS
1.0000 | ORAL_TABLET | ORAL | Status: DC | PRN
  Administered 2018-02-28 – 2018-03-01 (×4): 2 via ORAL
  Filled 2018-02-28 (×4): qty 2

## 2018-02-28 MED ORDER — ACETAMINOPHEN 325 MG PO TABS: 650.0000 mg | ORAL_TABLET | Freq: Four times a day (QID) | ORAL | Status: DC | PRN

## 2018-02-28 MED ORDER — SODIUM CHLORIDE 0.9 % IR SOLN
Status: DC | PRN
  Administered 2018-02-28: 3000 mL

## 2018-02-28 MED ORDER — ONDANSETRON HCL 4 MG/2ML IJ SOLN
INTRAMUSCULAR | Status: AC
  Filled 2018-02-28: qty 2

## 2018-02-28 MED ORDER — BUPIVACAINE LIPOSOME 1.3 % IJ SUSP
INTRAMUSCULAR | Status: AC
  Filled 2018-02-28: qty 20

## 2018-02-28 MED ORDER — POVIDONE-IODINE 10 % EX OINT
TOPICAL_OINTMENT | CUTANEOUS | Status: AC
  Filled 2018-02-28: qty 1

## 2018-02-28 MED ORDER — HEMOSTATIC AGENTS (NO CHARGE) OPTIME
TOPICAL | Status: DC | PRN
  Administered 2018-02-28 (×2): 1 via TOPICAL

## 2018-02-28 MED ORDER — POVIDONE-IODINE 10 % OINT PACKET
TOPICAL_OINTMENT | CUTANEOUS | Status: DC | PRN
  Administered 2018-02-28: 1 via TOPICAL

## 2018-02-28 MED ORDER — KETOROLAC TROMETHAMINE 30 MG/ML IJ SOLN
30.0000 mg | Freq: Four times a day (QID) | INTRAMUSCULAR | Status: DC | PRN
  Administered 2018-03-01: 30 mg via INTRAVENOUS
  Filled 2018-02-28: qty 1

## 2018-02-28 MED ORDER — FENTANYL CITRATE (PF) 100 MCG/2ML IJ SOLN
INTRAMUSCULAR | Status: AC
  Filled 2018-02-28: qty 4

## 2018-02-28 MED ORDER — DIPHENHYDRAMINE HCL 25 MG PO CAPS: 25.0000 mg | ORAL_CAPSULE | Freq: Four times a day (QID) | ORAL | Status: DC | PRN

## 2018-02-28 MED ORDER — MIDAZOLAM HCL 5 MG/5ML IJ SOLN
INTRAMUSCULAR | Status: DC | PRN
  Administered 2018-02-28: 2 mg via INTRAVENOUS

## 2018-02-28 MED ORDER — SUGAMMADEX SODIUM 500 MG/5ML IV SOLN
INTRAVENOUS | Status: DC | PRN
  Administered 2018-02-28 (×2): 100 mg via INTRAVENOUS

## 2018-02-28 MED ORDER — LACTATED RINGERS IV SOLN: INTRAVENOUS | Status: DC

## 2018-02-28 MED ORDER — SUCCINYLCHOLINE CHLORIDE 200 MG/10ML IV SOSY
PREFILLED_SYRINGE | INTRAVENOUS | Status: AC
  Filled 2018-02-28: qty 10

## 2018-02-28 SURGICAL SUPPLY — 51 items
APPLICATOR ARISTA FLEXITIP XL (MISCELLANEOUS) ×2 IMPLANT
APPLIER CLIP ROT 10 11.4 M/L (STAPLE) ×3
BAG RETRIEVAL 10 (BASKET) ×1
BAG RETRIEVAL 10MM (BASKET) ×1
CHLORAPREP W/TINT 26ML (MISCELLANEOUS) ×3 IMPLANT
CLIP APPLIE ROT 10 11.4 M/L (STAPLE) ×1 IMPLANT
CLOTH BEACON ORANGE TIMEOUT ST (SAFETY) ×3 IMPLANT
COVER LIGHT HANDLE STERIS (MISCELLANEOUS) ×6 IMPLANT
COVER WAND RF STERILE (DRAPES) ×2 IMPLANT
ELECT REM PT RETURN 9FT ADLT (ELECTROSURGICAL) ×3
ELECTRODE REM PT RTRN 9FT ADLT (ELECTROSURGICAL) ×1 IMPLANT
FILTER SMOKE EVAC LAPAROSHD (FILTER) ×3 IMPLANT
GLOVE BIO SURGEON STRL SZ7 (GLOVE) ×4 IMPLANT
GLOVE BIOGEL PI IND STRL 7.0 (GLOVE) ×1 IMPLANT
GLOVE BIOGEL PI INDICATOR 7.0 (GLOVE) ×2
GLOVE SURG SS PI 7.5 STRL IVOR (GLOVE) ×3 IMPLANT
GOWN STRL REUS W/ TWL XL LVL3 (GOWN DISPOSABLE) ×1 IMPLANT
GOWN STRL REUS W/TWL LRG LVL3 (GOWN DISPOSABLE) ×6 IMPLANT
GOWN STRL REUS W/TWL XL LVL3 (GOWN DISPOSABLE) ×2
HEMOSTAT ARISTA ABSORB 3G PWDR (MISCELLANEOUS) ×2 IMPLANT
HEMOSTAT SNOW SURGICEL 2X4 (HEMOSTASIS) ×3 IMPLANT
INST SET LAPROSCOPIC AP (KITS) ×3 IMPLANT
IV NS IRRIG 3000ML ARTHROMATIC (IV SOLUTION) ×2 IMPLANT
KIT TURNOVER KIT A (KITS) ×3 IMPLANT
MANIFOLD NEPTUNE II (INSTRUMENTS) ×3 IMPLANT
NDL HYPO 18GX1.5 BLUNT FILL (NEEDLE) ×1 IMPLANT
NDL INSUFFLATION 14GA 120MM (NEEDLE) ×1 IMPLANT
NEEDLE HYPO 18GX1.5 BLUNT FILL (NEEDLE) ×3 IMPLANT
NEEDLE HYPO 22GX1.5 SAFETY (NEEDLE) ×3 IMPLANT
NEEDLE INSUFFLATION 14GA 120MM (NEEDLE) ×3 IMPLANT
NS IRRIG 1000ML POUR BTL (IV SOLUTION) ×3 IMPLANT
PACK LAP CHOLE LZT030E (CUSTOM PROCEDURE TRAY) ×3 IMPLANT
PAD ARMBOARD 7.5X6 YLW CONV (MISCELLANEOUS) ×3 IMPLANT
SET BASIN LINEN APH (SET/KITS/TRAYS/PACK) ×3 IMPLANT
SET TUBE IRRIG SUCTION NO TIP (IRRIGATION / IRRIGATOR) ×2 IMPLANT
SLEEVE ENDOPATH XCEL 5M (ENDOMECHANICALS) ×3 IMPLANT
SPONGE GAUZE 2X2 8PLY STER LF (GAUZE/BANDAGES/DRESSINGS) ×1
SPONGE GAUZE 2X2 8PLY STRL LF (GAUZE/BANDAGES/DRESSINGS) ×5 IMPLANT
STAPLER VISISTAT (STAPLE) ×3 IMPLANT
SUT VICRYL 0 UR6 27IN ABS (SUTURE) ×3 IMPLANT
SYR 20CC LL (SYRINGE) ×3 IMPLANT
SYS BAG RETRIEVAL 10MM (BASKET) ×1
SYSTEM BAG RETRIEVAL 10MM (BASKET) ×1 IMPLANT
TAPE PAPER 2X10 WHT MICROPORE (GAUZE/BANDAGES/DRESSINGS) ×2 IMPLANT
TROCAR ENDO BLADELESS 11MM (ENDOMECHANICALS) ×3 IMPLANT
TROCAR XCEL NON-BLD 5MMX100MML (ENDOMECHANICALS) ×3 IMPLANT
TROCAR XCEL UNIV SLVE 11M 100M (ENDOMECHANICALS) ×3 IMPLANT
TUBE CONNECTING 12'X1/4 (SUCTIONS) ×1
TUBE CONNECTING 12X1/4 (SUCTIONS) ×2 IMPLANT
TUBING INSUFFLATION (TUBING) ×3 IMPLANT
WARMER LAPAROSCOPE (MISCELLANEOUS) ×3 IMPLANT

## 2018-02-28 NOTE — Op Note (Signed)
Patient:  Katie Roberson  DOB:  [birth year]  MRN:  [id number]   Preop Diagnosis: Acute cholecystitis, cholelithiasis  Postop Diagnosis: Same, hydrops of gallbladder  Procedure: Laparoscopic cholecystectomy  Surgeon: Franky MachoMark Demarcus Thielke, MD  Anes: General endotracheal  Indications: Patient is a 28 year old black female who presents with acute cholecystitis secondary to cholelithiasis.  The risks and benefits of the procedure including bleeding, infection, hepatobiliary injury, and the possibility of an open procedure were fully explained to the patient, who gave informed consent.  Procedure note: The patient was placed in the supine position.  After induction of general endotracheal anesthesia, the abdomen was prepped and draped using the usual sterile technique with ChloraPrep.  Surgical site confirmation was performed.  A supraumbilical incision was made down to the fascia.  A Veress needle was introduced into the abdominal cavity and confirmation of placement was done using the saline drop test.  The abdomen was then insufflated to 16 mmHg pressure.  An 11 mm trocar was introduced into the abdominal cavity under direct visualization without difficulty.  The patient was placed in reverse Trendelenburg position and an additional 1 mm trocar was placed in the epigastric region and 5 mm trochars were placed in the right upper quadrant and right flank regions.  Liver was inspected and noted to be within normal limits.  The gallbladder was noted to be distended and tense with an edematous gallbladder wall.  It was decompressed at the fundus and hydrops of the gallbladder was found.  The gallbladder was then retracted in a dynamic fashion in order to provide a critical view of the triangle of Calot.  The cystic duct was first identified.  Its juncture to the infundibulum was fully identified.  Endoclips were placed proximally and distally on the cystic duct, and the cystic duct was divided.  This was  likewise done to the cystic artery.  The gallbladder was freed away from the gallbladder fossa using Bovie electrocautery.  The gallbladder was delivered through the epigastric trocar site using an Endo Catch bag.  The gallbladder fossa was inspected and Arista and Surgicel were placed in the gallbladder fossa.  All fluid and air were then evacuated from the abdominal cavity prior to removal of the trochars. Exparel was instilled into all the wounds.  The epigastric fascia was reapproximated using an 0 Vicryl interrupted suture.  All skin incisions were closed using staples.  Betadine ointment and dry sterile dressings were applied.  All tape and needle counts were correct at the end of the procedure.  The patient was extubated in the operating room and transferred to PACU in stable condition.  Complications: None  EBL: Minimal  Specimen: Gallbladder

## 2018-02-28 NOTE — Interval H&P Note (Signed)
History and Physical Interval Note:  02/28/2018 8:15 AM  Katie Roberson  has presented today for surgery, with the diagnosis of acute cholecystitis, cholelithiasis  The various methods of treatment have been discussed with the patient and family. After consideration of risks, benefits and other options for treatment, the patient has consented to  Procedure(s): LAPAROSCOPIC CHOLECYSTECTOMY (N/A) as a surgical intervention .  The patient's history has been reviewed, patient examined, no change in status, stable for surgery.  I have reviewed the patient's chart and labs.  Questions were answered to the patient's satisfaction.     Franky MachoMark Tari Lecount

## 2018-02-28 NOTE — Anesthesia Procedure Notes (Signed)
Procedure Name: Intubation Date/Time: 02/28/2018 9:06 AM Performed by: Charmaine Downs, CRNA Pre-anesthesia Checklist: Patient identified, Patient being monitored, Timeout performed, Emergency Drugs available and Suction available Patient Re-evaluated:Patient Re-evaluated prior to induction Oxygen Delivery Method: Circle System Utilized Preoxygenation: Pre-oxygenation with 100% oxygen Induction Type: IV induction Ventilation: Mask ventilation without difficulty Laryngoscope Size: Mac and 4 Grade View: Grade I Tube type: Oral Tube size: 7.0 mm Number of attempts: 1 Airway Equipment and Method: stylet Placement Confirmation: ETT inserted through vocal cords under direct vision,  positive ETCO2 and breath sounds checked- equal and bilateral Secured at: 22 cm Tube secured with: Tape Dental Injury: Teeth and Oropharynx as per pre-operative assessment

## 2018-02-28 NOTE — Progress Notes (Signed)
Patient returned to 308 from OR/PACU. She is drowsy but easily aroused, and oriented. She is resting comfortably and her vitals are stable. X4 abdominal incisions are clean dry and intact. Will continue to monitor.

## 2018-02-28 NOTE — Anesthesia Postprocedure Evaluation (Signed)
Anesthesia Post Note  Patient: [name]  Procedure(s) Performed: LAPAROSCOPIC CHOLECYSTECTOMY (N/A Abdomen)  Patient location during evaluation: PACU Anesthesia Type: General Level of consciousness: awake and patient cooperative Pain management: pain level controlled Vital Signs Assessment: post-procedure vital signs reviewed and stable Respiratory status: spontaneous breathing, nonlabored ventilation and respiratory function stable Cardiovascular status: blood pressure returned to baseline Postop Assessment: no apparent nausea or vomiting Anesthetic complications: no     Last Vitals:  Vitals:   02/28/18 1015 02/28/18 1030  BP: (!) 141/86 139/90  Pulse: 86 96  Resp: (!) 25 (!) 25  Temp: (!) (P) 36.3 C   SpO2: 100% 96%    Last Pain:  Vitals:   02/28/18 1015  TempSrc:   PainSc: (P) 10-Worst pain ever                 Yenifer Saccente J

## 2018-02-28 NOTE — Transfer of Care (Signed)
Immediate Anesthesia Transfer of Care Note  Patient: Katie Roberson  Procedure(s) Performed: LAPAROSCOPIC CHOLECYSTECTOMY (N/A Abdomen)  Patient Location: PACU  Anesthesia Type:General  Level of Consciousness: awake and patient cooperative  Airway & Oxygen Therapy: Patient Spontanous Breathing and Patient connected to face mask oxygen  Post-op Assessment: Report given to RN, Post -op Vital signs reviewed and stable and Patient moving all extremities  Post vital signs: Reviewed and stable  Last Vitals:  Vitals Value Taken Time  BP    Temp    Pulse    Resp    SpO2      Last Pain:  Vitals:   02/28/18 0737  TempSrc: Oral  PainSc: 0-No pain      Patients Stated Pain Goal: 5 (02/28/18 0737)  Complications: No apparent anesthesia complications

## 2018-02-28 NOTE — Anesthesia Preprocedure Evaluation (Signed)
Anesthesia Evaluation    Airway Mallampati: III   Neck ROM: limited    Dental  (+) Dental Advidsory Given   Pulmonary former smoker,    breath sounds clear to auscultation       Cardiovascular  Rhythm:regular     Neuro/Psych Anxiety    GI/Hepatic   Endo/Other    Renal/GU      Musculoskeletal   Abdominal   Peds  Hematology   Anesthesia Other Findings Morbid obesity states 339# Denies any other sig PMH States npo for three days, strict npo p MN  Reproductive/Obstetrics                             Anesthesia Physical Anesthesia Plan  ASA: III  Anesthesia Plan: General   Post-op Pain Management:    Induction:   PONV Risk Score and Plan:   Airway Management Planned:   Additional Equipment:   Intra-op Plan:   Post-operative Plan:   Informed Consent: I have reviewed the patients History and Physical, chart, labs and discussed the procedure including the risks, benefits and alternatives for the proposed anesthesia with the patient or authorized representative who has indicated his/her understanding and acceptance.     Plan Discussed with: Anesthesiologist  Anesthesia Plan Comments:         Anesthesia Quick Evaluation

## 2018-02-28 NOTE — Progress Notes (Signed)
Discussed with Dr. Lovell Sheehan, he will kindly take over patient's care post op  Thank you,  Lakechia Nay M. Elvera Lennox, MD, PhD [facility]  Contact via  [email or web address]  [facility] Office Info P: [phone]  F: [phone]

## 2018-03-01 LAB — COMPREHENSIVE METABOLIC PANEL
ALK PHOS: 79 U/L (ref 38–126)
ALT: 37 U/L (ref 0–44)
ANION GAP: 5 (ref 5–15)
AST: 54 U/L — ABNORMAL HIGH (ref 15–41)
Albumin: 3 g/dL — ABNORMAL LOW (ref 3.5–5.0)
BUN: 5 mg/dL — ABNORMAL LOW (ref 6–20)
CO2: 26 mmol/L (ref 22–32)
CREATININE: 0.83 mg/dL (ref 0.44–1.00)
Calcium: 8.2 mg/dL — ABNORMAL LOW (ref 8.9–10.3)
Chloride: 106 mmol/L (ref 98–111)
Glucose, Bld: 89 mg/dL (ref 70–99)
Potassium: 3.4 mmol/L — ABNORMAL LOW (ref 3.5–5.1)
Sodium: 137 mmol/L (ref 135–145)
Total Bilirubin: 0.8 mg/dL (ref 0.3–1.2)
Total Protein: 6 g/dL — ABNORMAL LOW (ref 6.5–8.1)

## 2018-03-01 LAB — CBC
HEMATOCRIT: 34.6 % — AB (ref 36.0–46.0)
Hemoglobin: 11.1 g/dL — ABNORMAL LOW (ref 12.0–15.0)
MCH: 29.3 pg (ref 26.0–34.0)
MCHC: 32.1 g/dL (ref 30.0–36.0)
MCV: 91.3 fL (ref 80.0–100.0)
NRBC: 0 % (ref 0.0–0.2)
Platelets: 263 10*3/uL (ref 150–400)
RBC: 3.79 MIL/uL — ABNORMAL LOW (ref 3.87–5.11)
RDW: 12.9 % (ref 11.5–15.5)
WBC: 15.7 10*3/uL — ABNORMAL HIGH (ref 4.0–10.5)

## 2018-03-01 LAB — HIV ANTIBODY (ROUTINE TESTING W REFLEX): HIV Screen 4th Generation wRfx: NONREACTIVE

## 2018-03-01 MED ORDER — OXYCODONE-ACETAMINOPHEN 7.5-325 MG PO TABS: 1.0000 | ORAL_TABLET | Freq: Four times a day (QID) | ORAL | 0 refills | Status: DC | PRN

## 2018-03-01 NOTE — Progress Notes (Signed)
Patient given discharge instructions, all questions answered. Cipro left on AVS by error, marked through per MD clarification.   PIV x1 removed and patient helped with clothing. Awaiting her mother to discharge home.

## 2018-03-01 NOTE — Discharge Instructions (Signed)
Laparoscopic Cholecystectomy, Care After  This sheet gives you information about how to care for yourself after your procedure. Your health care provider may also give you more specific instructions. If you have problems or questions, contact your health care provider.  What can I expect after the procedure?  After the procedure, it is common to have:   Pain at your incision sites. You will be given medicines to control this pain.   Mild nausea or vomiting.    Bloating and possible shoulder pain from the air-like gas that was used during the procedure.  Follow these instructions at home:  Incision care     Follow instructions from your health care provider about how to take care of your incisions. Make sure you:  ? Wash your hands with soap and water before you change your bandage (dressing). If soap and water are not available, use hand sanitizer.  ? Change your dressing as told by your health care provider.  ? Leave stitches (sutures), skin glue, or adhesive strips in place. These skin closures may need to be in place for 2 weeks or longer. If adhesive strip edges start to loosen and curl up, you may trim the loose edges. Do not remove adhesive strips completely unless your health care provider tells you to do that.   Do not take baths, swim, or use a hot tub until your health care provider approves. Ask your health care provider if you can take showers. You may only be allowed to take sponge baths for bathing.   Check your incision area every day for signs of infection. Check for:  ? More redness, swelling, or pain.  ? More fluid or blood.  ? Warmth.  ? Pus or a bad smell.  Activity   Do not drive or use heavy machinery while taking prescription pain medicine.   Do not lift anything that is heavier than 10 lb (4.5 kg) until your health care provider approves.   Do not play contact sports until your health care provider approves.   Do not drive for 24 hours if you were given a medicine to help you relax  (sedative).   Rest as needed. Do not return to work or school until your health care provider approves.  General instructions   Take over-the-counter and prescription medicines only as told by your health care provider.   To prevent or treat constipation while you are taking prescription pain medicine, your health care provider may recommend that you:  ? Drink enough fluid to keep your urine clear or pale yellow.  ? Take over-the-counter or prescription medicines.  ? Eat foods that are high in fiber, such as fresh fruits and vegetables, whole grains, and beans.  ? Limit foods that are high in fat and processed sugars, such as fried and sweet foods.  Contact a health care provider if:   You develop a rash.   You have more redness, swelling, or pain around your incisions.   You have more fluid or blood coming from your incisions.   Your incisions feel warm to the touch.   You have pus or a bad smell coming from your incisions.   You have a fever.   One or more of your incisions breaks open.  Get help right away if:   You have trouble breathing.   You have chest pain.   You have increasing pain in your shoulders.   You faint or feel dizzy when you stand.   You have   severe pain in your abdomen.   You have nausea or vomiting that lasts for more than one day.   You have leg pain.  This information is not intended to replace advice given to you by your health care provider. Make sure you discuss any questions you have with your health care provider.  Document Released: 02/05/2005 Document Revised: 08/27/2015 Document Reviewed: 07/25/2015  Elsevier Interactive Patient Education  2019 [employer]

## 2018-03-01 NOTE — Progress Notes (Signed)
Patient discharged home with mother.

## 2018-03-01 NOTE — Discharge Summary (Signed)
Physician Discharge Summary  Patient ID: Katie Roberson MRN: [id number] DOB/AGE: Feb 16, 1991 27 y.o.  Admit date: 02/27/2018 Discharge date: 03/01/2018  Admission Diagnoses: Acute cholecystitis, cholelithiasis  Discharge Diagnoses: Same, hydrops of gallbladder Active Problems:   Cholecystitis   Calculus of gallbladder with acute cholecystitis without obstruction   Discharged Condition: good  Hospital Course: Patient is a 28 year old black female who presented to the emergency room with worsening right upper quadrant abdominal pain.  She has known history of cholelithiasis.  She was admitted to the hospital for control of her nausea and vomiting.  She was also admitted for control of her pain.  She subsequently underwent laparoscopic cholecystectomy on 02/28/2018.  She tolerated the procedure well.  Hydrops of the gallbladder was found.  Her postoperative course was unremarkable.  Her diet was advanced without difficulty.  The patient be discharged home on 02/19/2018 in good and improving condition.  Treatments: surgery: Laparoscopic cholecystectomy on 02/28/2018  Discharge Exam: Blood pressure 109/70, pulse 94, temperature 98 F (36.7 C), temperature source Oral, resp. rate 18, height 5\' 8"  (1.727 m), weight (!) 149.7 kg, SpO2 95 %. General appearance: alert, cooperative and no distress Resp: clear to auscultation bilaterally Cardio: regular rate and rhythm, S1, S2 normal, no murmur, click, rub or gallop GI: Soft, dressing is dry and intact.  Disposition: Discharge disposition: 01-Home or Self Care       Discharge Instructions    Diet - low sodium heart healthy   Complete by:  As directed    Increase activity slowly   Complete by:  As directed      Allergies as of 03/01/2018      Reactions   Kiwi Extract    Patient is allergic to kiwi. Causes facial swelling      Medication List    STOP taking these medications   traMADol 50 MG tablet Commonly known as:  ULTRAM      TAKE these medications   acetaminophen 325 MG tablet Commonly known as:  TYLENOL Take 650 mg by mouth every 6 (six) hours as needed.   ciprofloxacin 500 MG tablet Commonly known as:  CIPRO Take 1 tablet (500 mg total) by mouth 2 (two) times daily. One po bid x 7 days   ondansetron 4 MG disintegrating tablet Commonly known as:  ZOFRAN ODT 4mg  ODT q4 hours prn nausea/vomit   oxyCODONE-acetaminophen 7.5-325 MG tablet Commonly known as:  PERCOCET Take 1 tablet by mouth every 6 (six) hours as needed.      Follow-up Information    Franky Macho, MD. Schedule an appointment as soon as possible for a visit on 03/11/2018.   Specialty:  General Surgery Contact information: [address] [place] [address] [phone]           Signed: Franky Macho 03/01/2018, 9:38 AM

## 2018-03-03 ENCOUNTER — Encounter (HOSPITAL_COMMUNITY): Payer: Self-pay | Admitting: General Surgery

## 2018-03-11 ENCOUNTER — Ambulatory Visit (INDEPENDENT_AMBULATORY_CARE_PROVIDER_SITE_OTHER): Payer: Self-pay | Admitting: General Surgery

## 2018-03-11 ENCOUNTER — Encounter: Payer: Self-pay | Admitting: General Surgery

## 2018-03-11 VITALS — BP 116/71 | HR 79 | Temp 96.8°F | Resp 20 | Wt 323.4 lb

## 2018-03-11 DIAGNOSIS — Z09 Encounter for follow-up examination after completed treatment for conditions other than malignant neoplasm: Secondary | ICD-10-CM

## 2018-03-11 NOTE — Progress Notes (Signed)
Subjective:     Katie Roberson  Here for follow up s/p laparoscopic cholecystectomy.  Patient doing well.  Preoperative symptoms have resolved.  She denies any significant incisional pain.  No fever or chills have been noted. Objective:    BP 116/71 (BP Location: Left Arm, Patient Position: Sitting, Cuff Size: Large)   Pulse 79   Temp (!) 96.8 F (36 C) (Temporal)   Resp 20   Wt (!) 323 lb 6.4 oz (146.7 kg)   BMI 49.17 kg/m   General:  alert, cooperative and no distress  Abdomen soft, incisions healing well.  Staples removed, Steri-Strips applied. Final pathology consistent with diagnosis.     Assessment:    Doing well postoperatively.    Plan:   May increase activity as able.  May return to work without restrictions on 03/17/2018.  Follow-up here as needed.

## 2018-04-30 ENCOUNTER — Ambulatory Visit: Payer: Self-pay | Admitting: Obstetrics and Gynecology

## 2018-05-11 ENCOUNTER — Other Ambulatory Visit: Payer: Self-pay

## 2018-05-11 ENCOUNTER — Emergency Department (HOSPITAL_COMMUNITY)
Admission: EM | Admit: 2018-05-11 | Discharge: 2018-05-11 | Disposition: A | Discharge status: Home / Self Care | Payer: Medicaid Other | Attending: Emergency Medicine | Admitting: Emergency Medicine

## 2018-05-11 ENCOUNTER — Encounter (HOSPITAL_COMMUNITY): Payer: Self-pay | Admitting: Emergency Medicine

## 2018-05-11 DIAGNOSIS — R070 Pain in throat: Secondary | ICD-10-CM | POA: Insufficient documentation

## 2018-05-11 DIAGNOSIS — Z87891 Personal history of nicotine dependence: Secondary | ICD-10-CM | POA: Insufficient documentation

## 2018-05-11 DIAGNOSIS — J029 Acute pharyngitis, unspecified: Secondary | ICD-10-CM

## 2018-05-11 DIAGNOSIS — R0981 Nasal congestion: Secondary | ICD-10-CM | POA: Insufficient documentation

## 2018-05-11 LAB — GROUP A STREP BY PCR: Group A Strep by PCR: NOT DETECTED

## 2018-05-11 MED ORDER — LIDOCAINE VISCOUS HCL 2 % MT SOLN: 15.0000 mL | Freq: Four times a day (QID) | OROMUCOSAL | 0 refills | Status: DC | PRN

## 2018-05-11 NOTE — ED Triage Notes (Signed)
Patient reports sore throat and sneezing x 2 days.

## 2018-05-11 NOTE — ED Provider Notes (Signed)
[facility] EMERGENCY DEPARTMENT Provider Note   CSN: [id number] Arrival date & time: 05/11/18  1345    History   Chief Complaint Chief Complaint  Patient presents with  . Sore Throat    HPI Katie Roberson is a 28 y.o. female.     The history is provided by the patient. No language interpreter was used.  Sore Throat      28 year old female presenting for evaluation of throat irritation.  Patient reports that she would like to get tested for sexually transmitted infection.  She states she has some mild sinus congestion and throat irritation since yesterday.  She otherwise denies having any new sexual partners, denies any significant vaginal discharge or vaginal bleeding or abdominal pain.  She does not complain of any fever or chills and denies any recent exposure to sick contact.  She denies any specific treatment tried.  Past Medical History:  Diagnosis Date  . Anxiety   . Chicken pox   . Chronic abdominal pain   . Chronic pelvic pain in female   . Dysmenorrhea   . Gallstones   . PCOS (polycystic ovarian syndrome)   . Vaginal pain    chronic    Patient Active Problem List   Diagnosis Date Noted  . Calculus of gallbladder with acute cholecystitis without obstruction   . Cholecystitis 02/27/2018  . Acute cholecystitis   . Abnormal uterine bleeding (AUB) 02/08/2016  . Screening examination for STD (sexually transmitted disease) 02/08/2016  . Abnormal vaginal bleeding 01/29/2013  . PCO (polycystic ovaries) 12/26/2012  . DUB (dysfunctional uterine bleeding) 12/26/2012  . Morbid obesity (HCC) 07/16/2012    Past Surgical History:  Procedure Laterality Date  . CHOLECYSTECTOMY N/A 02/28/2018   Procedure: LAPAROSCOPIC CHOLECYSTECTOMY;  Surgeon: Franky Macho, MD;  Location: [facility];  Service: General;  Laterality: N/A;     OB History    Gravida  0   Para  0   Term  0   Preterm  0   AB  0   Living  0     SAB  0   TAB  0   Ectopic  0   Multiple   0   Live Births               Home Medications    Prior to Admission medications   Medication Sig Start Date End Date Taking? Authorizing Provider  acetaminophen (TYLENOL) 325 MG tablet Take 650 mg by mouth every 6 (six) hours as needed.    [provider]  oxyCODONE-acetaminophen (PERCOCET) 7.5-325 MG tablet Take 1 tablet by mouth every 6 (six) hours as needed. 03/01/18 03/01/19  Franky Macho, MD    Family History Family History  Problem Relation Age of Onset  . Diabetes Paternal Grandmother     Social History Social History   Tobacco Use  . Smoking status: Former Smoker    Types: Cigarettes  . Smokeless tobacco: Never Used  Substance Use Topics  . Alcohol use: Yes    Comment: socially   . Drug use: No     Allergies   Kiwi extract   Review of Systems Review of Systems  All other systems reviewed and are negative.    Physical Exam Updated Vital Signs BP (!) 144/80 (BP Location: Right Arm)   Pulse 71   Temp 98.3 F (36.8 C) (Oral)   Resp 18   Ht 5\' 8"  (1.727 m)   Wt (!) 146.5 kg   SpO2 97%  BMI 49.11 kg/m   Physical Exam Vitals signs and nursing note reviewed.  Constitutional:      General: She is not in acute distress.    Appearance: She is well-developed.  HENT:     Head: Atraumatic.     Comments: Ears: TMs normal bilaterally Nose: Normal nares Throat: Uvula midline no tonsillar enlargement or exudates, no trismus  Eyes:     Conjunctiva/sclera: Conjunctivae normal.  Neck:     Musculoskeletal: Neck supple.  Cardiovascular:     Rate and Rhythm: Normal rate and regular rhythm.  Abdominal:     Palpations: Abdomen is soft.     Tenderness: There is no abdominal tenderness.  Skin:    Findings: No rash.  Neurological:     Mental Status: She is alert.      ED Treatments / Results  Labs (all labs ordered are listed, but only abnormal results are displayed) Labs Reviewed  GROUP A STREP BY PCR    EKG None  Radiology No  results found.  Procedures Procedures (including critical care time)  Medications Ordered in ED Medications - No data to display   Initial Impression / Assessment and Plan / ED Course  I have reviewed the triage vital signs and the nursing notes.  Pertinent labs & imaging results that were available during my care of the patient were reviewed by me and considered in my medical decision making (see chart for details).        BP (!) 144/80 (BP Location: Right Arm)   Pulse 71   Temp 98.3 F (36.8 C) (Oral)   Resp 18   Ht 5\' 8"  (1.727 m)   Wt (!) 146.5 kg   SpO2 97%   BMI 49.11 kg/m    Final Clinical Impressions(s) / ED Diagnoses   Final diagnoses:  Sore throat    ED Discharge Orders         Ordered    lidocaine (XYLOCAINE) 2 % solution  Every 6 hours PRN     05/11/18 1500         2:38 PM Patient here with throat irritation.  Throat examination unremarkable, low suspicion for PTA or deep tissue infection.  She also requesting for STI check after she denies any significant symptoms.  She has a soft abdomen she does not have any significant risk factor for STI.  I encourage patient to have that tested with her PCP outpatient.  Per patient request, rapid strep test obtained.  2:59 PM Strep test neg, reassurance given.  Recommend outpt f/u for STI screening.  Doubt chlamydial or gonorrheal pharyngitis.    Fayrene Helper, PA-C 05/11/18 1501    Samuel Jester, DO 05/14/18 1413

## 2018-05-11 NOTE — ED Notes (Signed)
Pt also reports when RN at bedside that she wants STD testing. Reports a clear vaginal discharge with odor. Denies abd pain, denies known exposure

## 2018-07-31 ENCOUNTER — Other Ambulatory Visit: Payer: Self-pay

## 2018-07-31 ENCOUNTER — Encounter (HOSPITAL_COMMUNITY): Payer: Self-pay | Admitting: Emergency Medicine

## 2018-07-31 ENCOUNTER — Emergency Department (HOSPITAL_COMMUNITY)
Admission: EM | Admit: 2018-07-31 | Discharge: 2018-07-31 | Disposition: A | Discharge status: Home / Self Care | Payer: Medicaid Other | Attending: Emergency Medicine | Admitting: Emergency Medicine

## 2018-07-31 DIAGNOSIS — L509 Urticaria, unspecified: Secondary | ICD-10-CM | POA: Insufficient documentation

## 2018-07-31 DIAGNOSIS — Z79899 Other long term (current) drug therapy: Secondary | ICD-10-CM | POA: Insufficient documentation

## 2018-07-31 DIAGNOSIS — Z87891 Personal history of nicotine dependence: Secondary | ICD-10-CM | POA: Insufficient documentation

## 2018-07-31 MED ORDER — DIPHENHYDRAMINE HCL 50 MG/ML IJ SOLN
25.0000 mg | Freq: Once | INTRAMUSCULAR | Status: AC
  Administered 2018-07-31: 25 mg via INTRAVENOUS
  Filled 2018-07-31: qty 1

## 2018-07-31 MED ORDER — FAMOTIDINE IN NACL 20-0.9 MG/50ML-% IV SOLN
20.0000 mg | Freq: Once | INTRAVENOUS | Status: AC
  Administered 2018-07-31: 20 mg via INTRAVENOUS
  Filled 2018-07-31: qty 50

## 2018-07-31 MED ORDER — METHYLPREDNISOLONE SODIUM SUCC 125 MG IJ SOLR
125.0000 mg | Freq: Once | INTRAMUSCULAR | Status: AC
  Administered 2018-07-31: 04:00:00 125 mg via INTRAVENOUS
  Filled 2018-07-31: qty 2

## 2018-07-31 MED ORDER — EPINEPHRINE 0.3 MG/0.3ML IJ SOAJ: 0.3000 mg | INTRAMUSCULAR | 0 refills | Status: DC | PRN

## 2018-07-31 MED ORDER — HYDROXYZINE HCL 25 MG PO TABS
25.0000 mg | ORAL_TABLET | Freq: Once | ORAL | Status: AC
  Administered 2018-07-31: 25 mg via ORAL
  Filled 2018-07-31: qty 1

## 2018-07-31 MED ORDER — DIPHENHYDRAMINE HCL 25 MG PO TABS: 25.0000 mg | ORAL_TABLET | Freq: Three times a day (TID) | ORAL | 0 refills | Status: DC | PRN

## 2018-07-31 MED ORDER — PREDNISONE 20 MG PO TABS: 40.0000 mg | ORAL_TABLET | Freq: Every day | ORAL | 0 refills | Status: DC

## 2018-07-31 NOTE — ED Provider Notes (Signed)
[facility] EMERGENCY DEPARTMENT Provider Note   CSN: [id number] Arrival date & time: 07/31/18  0253    History   Chief Complaint Chief Complaint  Patient presents with  . Allergic Reaction    HPI Katie Roberson is a 28 y.o. female.     HPI  This is a 28 year old female who presents with hives.  Patient reports that she ate a popsicle approximately 1 hour prior to arrival.  She has an allergy to kiwi.  She suspects that the popsicle may have had kiwi in it.  She developed hives over her trunk, arms.  She denies any shortness of breath, nausea, vomiting, throat swelling.  She did not take anything prior to arrival.  Past Medical History:  Diagnosis Date  . Anxiety   . Chicken pox   . Chronic abdominal pain   . Chronic pelvic pain in female   . Dysmenorrhea   . Gallstones   . PCOS (polycystic ovarian syndrome)   . Vaginal pain    chronic    Patient Active Problem List   Diagnosis Date Noted  . Calculus of gallbladder with acute cholecystitis without obstruction   . Cholecystitis 02/27/2018  . Acute cholecystitis   . Abnormal uterine bleeding (AUB) 02/08/2016  . Screening examination for STD (sexually transmitted disease) 02/08/2016  . Abnormal vaginal bleeding 01/29/2013  . PCO (polycystic ovaries) 12/26/2012  . DUB (dysfunctional uterine bleeding) 12/26/2012  . Morbid obesity (HCC) 07/16/2012    Past Surgical History:  Procedure Laterality Date  . CHOLECYSTECTOMY N/A 02/28/2018   Procedure: LAPAROSCOPIC CHOLECYSTECTOMY;  Surgeon: Franky MachoJenkins, Mark, MD;  Location: [facility];  Service: General;  Laterality: N/A;     OB History    Gravida  0   Para  0   Term  0   Preterm  0   AB  0   Living  0     SAB  0   TAB  0   Ectopic  0   Multiple  0   Live Births               Home Medications    Prior to Admission medications   Medication Sig Start Date End Date Taking? Authorizing Provider  acetaminophen (TYLENOL) 325 MG tablet Take 650 mg by  mouth every 6 (six) hours as needed.    [provider]  diphenhydrAMINE (BENADRYL) 25 MG tablet Take 1 tablet (25 mg total) by mouth every 8 (eight) hours as needed. 07/31/18   Shadie Sweatman, Mayer Maskerourtney F, MD  EPINEPHrine 0.3 mg/0.3 mL IJ SOAJ injection Inject 0.3 mLs (0.3 mg total) into the muscle as needed for anaphylaxis. 07/31/18   Kollins Fenter, Mayer Maskerourtney F, MD  lidocaine (XYLOCAINE) 2 % solution Use as directed 15 mLs in the mouth or throat every 6 (six) hours as needed for mouth pain. 05/11/18   Fayrene Helperran, Bowie, PA-C  oxyCODONE-acetaminophen (PERCOCET) 7.5-325 MG tablet Take 1 tablet by mouth every 6 (six) hours as needed. 03/01/18 03/01/19  Franky MachoJenkins, Mark, MD  predniSONE (DELTASONE) 20 MG tablet Take 2 tablets (40 mg total) by mouth daily. 07/31/18   Everrett Lacasse, Mayer Maskerourtney F, MD    Family History Family History  Problem Relation Age of Onset  . Diabetes Paternal Grandmother     Social History Social History   Tobacco Use  . Smoking status: Former Smoker    Types: Cigarettes  . Smokeless tobacco: Never Used  Substance Use Topics  . Alcohol use: Yes    Comment: socially   .  Drug use: No     Allergies   Kiwi extract   Review of Systems Review of Systems  Constitutional: Negative for fever.  HENT: Negative for trouble swallowing.   Respiratory: Negative for shortness of breath.   Cardiovascular: Negative for chest pain.  Gastrointestinal: Negative for nausea and vomiting.  Skin: Positive for rash.  All other systems reviewed and are negative.    Physical Exam Updated Vital Signs BP 127/83   Pulse 69   Temp 98 F (36.7 C) (Oral)   Resp (!) 0   Ht 1.727 m (5\' 8" )   Wt (!) 149.7 kg   LMP 07/23/2018   SpO2 98%   BMI 50.18 kg/m   Physical Exam Vitals signs and nursing note reviewed.  Constitutional:      Appearance: She is well-developed. She is obese.  HENT:     Head: Normocephalic and atraumatic.  Eyes:     Pupils: Pupils are equal, round, and reactive to light.  Neck:      Musculoskeletal: Neck supple.  Cardiovascular:     Rate and Rhythm: Normal rate and regular rhythm.     Heart sounds: Normal heart sounds.  Pulmonary:     Effort: Pulmonary effort is normal. No respiratory distress.     Breath sounds: No wheezing.  Abdominal:     Palpations: Abdomen is soft.     Tenderness: There is no abdominal tenderness.  Skin:    General: Skin is warm and dry.     Findings: Rash present.     Comments: Hives noted mostly over the trunk and bilateral upper extremities  Neurological:     Mental Status: She is alert and oriented to person, place, and time.  Psychiatric:        Mood and Affect: Mood normal.      ED Treatments / Results  Labs (all labs ordered are listed, but only abnormal results are displayed) Labs Reviewed - No data to display  EKG None  Radiology No results found.  Procedures Procedures (including critical care time)  Medications Ordered in ED Medications  diphenhydrAMINE (BENADRYL) injection 25 mg (25 mg Intravenous Given 07/31/18 0329)  methylPREDNISolone sodium succinate (SOLU-MEDROL) 125 mg/2 mL injection 125 mg (125 mg Intravenous Given 07/31/18 0330)  famotidine (PEPCID) IVPB 20 mg premix (0 mg Intravenous Stopped 07/31/18 0406)  hydrOXYzine (ATARAX/VISTARIL) tablet 25 mg (25 mg Oral Given 07/31/18 0507)     Initial Impression / Assessment and Plan / ED Course  I have reviewed the triage vital signs and the nursing notes.  Pertinent labs & imaging results that were available during my care of the patient were reviewed by me and considered in my medical decision making (see chart for details).  Clinical Course as of Jul 31 607  Thu Jul 31, 2018  0503 Patient resting comfortably on recheck.  No signs or symptoms of anaphylaxis.  She does have persistent hives but reports itching has somewhat improved.   [CH]    Clinical Course User Index [CH] Darleene Cumpian, Mayer Maskerourtney F, MD       Patient presents with hives.  She suspects she  may have reacted to eating a popsicle.  She is nontoxic-appearing and vital signs are reassuring.  She has no signs or symptoms of anaphylaxis at this time.  She was given Solu-Medrol, Pepcid, and Benadryl.  See clinical course above.  On multiple rechecks she is resting comfortably.  No progression of symptoms.  On last recheck, have seemed to be improving.  Will discharge home with a short course of prednisone and Benadryl as needed.  She was also provided with an EpiPen.  After history, exam, and medical workup I feel the patient has been appropriately medically screened and is safe for discharge home. Pertinent diagnoses were discussed with the patient. Patient was given return precautions.  Final Clinical Impressions(s) / ED Diagnoses   Final diagnoses:  Hives    ED Discharge Orders         Ordered    predniSONE (DELTASONE) 20 MG tablet  Daily     07/31/18 0608    diphenhydrAMINE (BENADRYL) 25 MG tablet  Every 8 hours PRN     07/31/18 0608    EPINEPHrine 0.3 mg/0.3 mL IJ SOAJ injection  As needed     07/31/18 2353           Merryl Hacker, MD 07/31/18 [phone]

## 2018-07-31 NOTE — ED Triage Notes (Signed)
Pt reports she is allergic to kiwi, she ate a ice pop about an hour ago, pt has hives sporadically over her body including her neck, denies SOB of difficulty breathing

## 2018-12-09 ENCOUNTER — Ambulatory Visit: Payer: Medicaid Other | Admitting: Obstetrics and Gynecology

## 2018-12-18 ENCOUNTER — Emergency Department: Payer: Self-pay

## 2018-12-18 ENCOUNTER — Other Ambulatory Visit: Payer: Self-pay

## 2018-12-18 ENCOUNTER — Emergency Department
Admission: EM | Admit: 2018-12-18 | Discharge: 2018-12-18 | Disposition: A | Discharge status: Home / Self Care | Payer: Self-pay | Attending: Emergency Medicine | Admitting: Emergency Medicine

## 2018-12-18 ENCOUNTER — Encounter: Payer: Self-pay | Admitting: Emergency Medicine

## 2018-12-18 DIAGNOSIS — Z87891 Personal history of nicotine dependence: Secondary | ICD-10-CM | POA: Insufficient documentation

## 2018-12-18 DIAGNOSIS — Z79899 Other long term (current) drug therapy: Secondary | ICD-10-CM | POA: Insufficient documentation

## 2018-12-18 DIAGNOSIS — M5441 Lumbago with sciatica, right side: Secondary | ICD-10-CM | POA: Insufficient documentation

## 2018-12-18 DIAGNOSIS — M25562 Pain in left knee: Secondary | ICD-10-CM | POA: Insufficient documentation

## 2018-12-18 DIAGNOSIS — M5442 Lumbago with sciatica, left side: Secondary | ICD-10-CM

## 2018-12-18 LAB — POCT PREGNANCY, URINE: Preg Test, Ur: NEGATIVE

## 2018-12-18 MED ORDER — PREDNISONE 10 MG PO TABS: ORAL_TABLET | ORAL | 0 refills | Status: DC

## 2018-12-18 MED ORDER — TRAMADOL HCL 50 MG PO TABS: 50.0000 mg | ORAL_TABLET | Freq: Four times a day (QID) | ORAL | 0 refills | Status: DC | PRN

## 2018-12-18 NOTE — ED Provider Notes (Signed)
[facility] Emergency Department Provider Note  ____________________________________________   First MD Initiated Contact with Patient 12/18/18 [phone]     (approximate)  I have reviewed the triage vital signs and the nursing notes.   HISTORY  Chief Complaint Back Pain and Leg Pain   HPI Katie Roberson is a 28 y.o. female presents to the ED with complaint of low back pain and left knee pain after she fell approximately 2 weeks ago.  Patient states that she landed on her back on a hard floor and as she fell she felt her left knee twisting.  She is continue to ambulate without any assistance since that time.  She denies any head injury or loss of consciousness.  She has been taking over the counter medication without any relief.  She denies any previous back problems prior to her fall.  Rates her pain as a 10 out of 10.       Past Medical History:  Diagnosis Date   Anxiety    Chicken pox    Chronic abdominal pain    Chronic pelvic pain in female    Dysmenorrhea    Gallstones    PCOS (polycystic ovarian syndrome)    Vaginal pain    chronic    Patient Active Problem List   Diagnosis Date Noted   Calculus of gallbladder with acute cholecystitis without obstruction    Cholecystitis 02/27/2018   Acute cholecystitis    Abnormal uterine bleeding (AUB) 02/08/2016   Screening examination for STD (sexually transmitted disease) 02/08/2016   Abnormal vaginal bleeding 01/29/2013   PCO (polycystic ovaries) 12/26/2012   Morbid obesity (HCC) 07/16/2012    Past Surgical History:  Procedure Laterality Date   CHOLECYSTECTOMY N/A 02/28/2018   Procedure: LAPAROSCOPIC CHOLECYSTECTOMY;  Surgeon: Franky Macho, MD;  Location: [facility];  Service: General;  Laterality: N/A;    Prior to Admission medications   Medication Sig Start Date End Date Taking? Authorizing Provider  acetaminophen (TYLENOL) 325 MG tablet Take 650 mg by mouth every 6 (six)  hours as needed.    [provider]  predniSONE (DELTASONE) 10 MG tablet Take 6 tablets  today, on day 2 take 5 tablets, day 3 take 4 tablets, day 4 take 3 tablets, day 5 take  2 tablets and 1 tablet the last day 12/18/18   Tommi Rumps, PA-C  traMADol (ULTRAM) 50 MG tablet Take 1 tablet (50 mg total) by mouth every 6 (six) hours as needed. 12/18/18   Tommi Rumps, PA-C    Allergies Kiwi extract  Family History  Problem Relation Age of Onset   Diabetes Paternal Grandmother     Social History Social History   Tobacco Use   Smoking status: Former Smoker    Types: Cigarettes   Smokeless tobacco: Never Used  Substance Use Topics   Alcohol use: Yes    Comment: socially    Drug use: No    Review of Systems Constitutional: No fever/chills Eyes: No visual changes. ENT: No trauma. Cardiovascular: Denies chest pain. Respiratory: Denies shortness of breath. Gastrointestinal: No abdominal pain.  No nausea, no vomiting. Musculoskeletal: Positive for low back pain.  Positive for left knee pain. Skin: Negative for rash. Neurological: Negative for headaches, focal weakness or numbness. ___________________________________________   PHYSICAL EXAM:  VITAL SIGNS: ED Triage Vitals  Enc Vitals Group     BP 12/18/18 0921 (!) 141/75     Pulse Rate 12/18/18 0921 87     Resp 12/18/18  0921 16     Temp 12/18/18 0921 98.4 F (36.9 C)     Temp Source 12/18/18 0921 Oral     SpO2 12/18/18 0921 97 %     Weight 12/18/18 0922 (!) 330 lb (149.7 kg)     Height 12/18/18 0922 5\' 8"  (1.727 m)     Head Circumference --      Peak Flow --      Pain Score 12/18/18 0928 10     Pain Loc --      Pain Edu? --      Excl. in GC? --     Constitutional: Alert and oriented. Well appearing and in no acute distress. Eyes: Conjunctivae are normal. PERRL. EOMI. Head: Atraumatic. Neck: No stridor.   Cardiovascular: Normal rate, regular rhythm. Grossly normal heart sounds.  Good  peripheral circulation. Respiratory: Normal respiratory effort.  No retractions. Lungs CTAB. Musculoskeletal: On examination of the low back there is no gross deformity however there is tenderness on palpation of the lower lumbar and sacral area.  No gross deformity and range of motion is restricted as it increases her pain.  Also on examination of the left knee there is no effusion or gross deformity.  No soft tissue edema or discoloration is noted.  Range of motion is slow no crepitus is noted.  Good muscle strength bilaterally in the lower extremities.  Patient is able to bear weight without assistance. Neurologic:  Normal speech and language. No gross focal neurologic deficits are appreciated. No gait instability. Skin:  Skin is warm, dry and intact. No rash noted. Psychiatric: Mood and affect are normal. Speech and behavior are normal.  ____________________________________________   LABS (all labs ordered are listed, but only abnormal results are displayed)  Labs Reviewed  POC URINE PREG, ED  POCT PREGNANCY, URINE    RADIOLOGY  Official radiology report(s): Dg Lumbar Spine 2-3 Views  Result Date: 12/18/2018 CLINICAL DATA:  Low back pain following a fall 2 weeks ago. EXAM: LUMBAR SPINE - 2-3 VIEW COMPARISON:  03/15/2014 FINDINGS: Five non-rib-bearing lumbar vertebrae. Straightening of the normal lumbar lordosis. Minimal anterior spur formation at the L4-5 and L5-S1 levels. No fractures, pars defects or subluxations. IMPRESSION: Minimal degenerative changes at the L4-5 and L5-S1 levels. No fracture or subluxation. Electronically Signed   By: Beckie SaltsSteven  Reid M.D.   On: 12/18/2018 11:02   Dg Knee Complete 4 Views Left  Result Date: 12/18/2018 CLINICAL DATA:  Left knee pain following a fall 2 weeks ago. EXAM: LEFT KNEE - COMPLETE 4+ VIEW COMPARISON:  None. FINDINGS: No evidence of fracture, dislocation, or joint effusion. No evidence of arthropathy or other focal bone abnormality. Soft  tissues are unremarkable. IMPRESSION: Normal examination. Electronically Signed   By: Beckie SaltsSteven  Reid M.D.   On: 12/18/2018 11:02    ____________________________________________   PROCEDURES  Procedure(s) performed (including Critical Care):  Procedures   ____________________________________________   INITIAL IMPRESSION / ASSESSMENT AND PLAN / ED COURSE  As part of my medical decision making, I reviewed the following data within the electronic MEDICAL RECORD NUMBER Notes from prior ED visits and [place] Controlled Substance Database  28 year old female presents to the ED with complaint of low back pain and left knee pain after falling 2 weeks ago.  She states that she has tried creams and over-the-counter medications without any relief.  She has continued to ambulate without any assistance.  Physical exam was unremarkable with the exception of some tenderness and slow guarded movements.  X-rays were  reassuring and patient was made aware.  She was given a prescription for prednisone tapering dose and tramadol 50 mg every 6 hours as needed for pain.  She is to use ice or heat to her back and ice to her knee as needed for discomfort.  She will follow-up with her PCP if any continued problems. ____________________________________________   FINAL CLINICAL IMPRESSION(S) / ED DIAGNOSES  Final diagnoses:  Acute bilateral low back pain with left-sided sciatica  Acute pain of left knee     ED Discharge Orders         Ordered    predniSONE (DELTASONE) 10 MG tablet     12/18/18 1133    traMADol (ULTRAM) 50 MG tablet  Every 6 hours PRN     12/18/18 1133           Note:  This document was prepared using Dragon voice recognition software and may include unintentional dictation errors.    Johnn Hai, PA-C 12/18/18 1553    Delman Kitten, MD 12/18/18 1606

## 2018-12-18 NOTE — ED Triage Notes (Signed)
Pt here with c/o left lower back pain radiating down to her left knee, states she fell 2 weeks ago and it's hurt ever since. Has tried creams and OTC pain med with no relief.

## 2018-12-18 NOTE — ED Notes (Signed)
See triage note  Presents s/p fall about 2 weeks ago  Having pain to left knee  States she is also having some pain to lower back since the fall  Main concern is her knee

## 2018-12-18 NOTE — Discharge Instructions (Addendum)
Follow-up with your primary care provider if any continued problems.  You may use ice or heat to your back as needed for discomfort.  Use ice to your knee as this will help with pain.  Begin taking medication only as directed.  Do not take the pain medication if you plan on driving or operating machinery as it could cause drowsiness.

## 2019-01-15 ENCOUNTER — Emergency Department (HOSPITAL_COMMUNITY)
Admission: EM | Admit: 2019-01-15 | Discharge: 2019-01-15 | Disposition: A | Discharge status: Home / Self Care | Payer: Self-pay | Attending: Emergency Medicine | Admitting: Emergency Medicine

## 2019-01-15 ENCOUNTER — Encounter (HOSPITAL_COMMUNITY): Payer: Self-pay | Admitting: *Deleted

## 2019-01-15 DIAGNOSIS — L509 Urticaria, unspecified: Secondary | ICD-10-CM

## 2019-01-15 DIAGNOSIS — Z87891 Personal history of nicotine dependence: Secondary | ICD-10-CM | POA: Insufficient documentation

## 2019-01-15 MED ORDER — PREDNISONE 10 MG PO TABS: ORAL_TABLET | ORAL | 0 refills | Status: DC

## 2019-01-15 MED ORDER — DIPHENHYDRAMINE HCL 25 MG PO TABS: 25.0000 mg | ORAL_TABLET | Freq: Four times a day (QID) | ORAL | 0 refills | Status: DC | PRN

## 2019-01-15 MED ORDER — FAMOTIDINE 20 MG PO TABS
20.0000 mg | ORAL_TABLET | Freq: Once | ORAL | Status: AC
  Administered 2019-01-15: 12:00:00 20 mg via ORAL
  Filled 2019-01-15: qty 1

## 2019-01-15 MED ORDER — FAMOTIDINE 20 MG PO TABS: 20.0000 mg | ORAL_TABLET | Freq: Two times a day (BID) | ORAL | 0 refills | Status: DC

## 2019-01-15 MED ORDER — DEXAMETHASONE SODIUM PHOSPHATE 10 MG/ML IJ SOLN
10.0000 mg | Freq: Once | INTRAMUSCULAR | Status: AC
  Administered 2019-01-15: 12:00:00 10 mg via INTRAMUSCULAR
  Filled 2019-01-15: qty 1

## 2019-01-15 MED ORDER — DIPHENHYDRAMINE HCL 50 MG/ML IJ SOLN
50.0000 mg | Freq: Once | INTRAMUSCULAR | Status: AC
  Administered 2019-01-15: 50 mg via INTRAMUSCULAR
  Filled 2019-01-15: qty 1

## 2019-01-15 NOTE — ED Provider Notes (Signed)
[facility] EMERGENCY DEPARTMENT Provider Note   CSN: [id number] Arrival date & time: 01/15/19  1036     History   Chief Complaint Chief Complaint  Patient presents with  . Allergic Reaction    HPI Katie Roberson is a 28 y.o. female with no significant past medical history presenting with hives since waking this morning around 9 AM.  She describes a raised pruritic rash mostly around her neck ears and upper chest and feels puffiness around her eyes.  She denies shortness of breath, mouth throat or tongue pain, swelling, wheezing or stridor.  Also no fevers or chills or no recent illnesses.  She does have a history of allergy to kiwi which causes hives.  Last night before bed she drink grapefruit juice which she does not usually consume, denies any other obvious new exposures to foods, cosmetics, soaps or other chemicals.  However, she just completed a course of metronidazole 3 days ago for bacterial vaginosis.  She has had this medication in the past without allergic reaction.  She took 25 mg Benadryl prior to arrival which did not improve her symptoms.     The history is provided by the patient.  Allergic Reaction Presenting symptoms: rash   Presenting symptoms: no difficulty swallowing and no wheezing     Past Medical History:  Diagnosis Date  . Anxiety   . Chicken pox   . Chronic abdominal pain   . Chronic pelvic pain in female   . Dysmenorrhea   . Gallstones   . PCOS (polycystic ovarian syndrome)   . Vaginal pain    chronic    Patient Active Problem List   Diagnosis Date Noted  . Calculus of gallbladder with acute cholecystitis without obstruction   . Cholecystitis 02/27/2018  . Acute cholecystitis   . Abnormal uterine bleeding (AUB) 02/08/2016  . Screening examination for STD (sexually transmitted disease) 02/08/2016  . Abnormal vaginal bleeding 01/29/2013  . PCO (polycystic ovaries) 12/26/2012  . Morbid obesity ([place]) 07/16/2012    Past Surgical History:   Procedure Laterality Date  . CHOLECYSTECTOMY N/A 02/28/2018   Procedure: LAPAROSCOPIC CHOLECYSTECTOMY;  Surgeon: Aviva Signs, MD;  Location: [facility];  Service: General;  Laterality: N/A;     OB History    Gravida  0   Para  0   Term  0   Preterm  0   AB  0   Living  0     SAB  0   TAB  0   Ectopic  0   Multiple  0   Live Births               Home Medications    Prior to Admission medications   Medication Sig Start Date End Date Taking? Authorizing Provider  acetaminophen (TYLENOL) 325 MG tablet Take 650 mg by mouth every 6 (six) hours as needed.    [provider]  diphenhydrAMINE (BENADRYL) 25 MG tablet Take 1-2 tablets (25-50 mg total) by mouth every 6 (six) hours as needed for itching (and rash). 01/15/19   Evalee Jefferson, PA-C  famotidine (PEPCID) 20 MG tablet Take 1 tablet (20 mg total) by mouth 2 (two) times daily. 01/15/19   Lorena Benham, Almyra Free, PA-C  predniSONE (DELTASONE) 10 MG tablet Take 6 tablets day one, 5 tablets day two, 4 tablets day three, 3 tablets day four, 2 tablets day five, then 1 tablet day six 01/15/19   Tycho Cheramie, Almyra Free, PA-C  traMADol (ULTRAM) 50 MG tablet Take 1 tablet (  50 mg total) by mouth every 6 (six) hours as needed. 12/18/18   Tommi RumpsSummers, Rhonda L, PA-C    Family History Family History  Problem Relation Age of Onset  . Diabetes Paternal Grandmother     Social History Social History   Tobacco Use  . Smoking status: Former Smoker    Types: Cigarettes  . Smokeless tobacco: Never Used  Substance Use Topics  . Alcohol use: Yes    Comment: socially   . Drug use: No     Allergies   Grapefruit concentrate and Kiwi extract   Review of Systems Review of Systems  Constitutional: Negative for chills and fever.  HENT: Positive for facial swelling. Negative for congestion, sore throat, trouble swallowing and voice change.   Eyes: Negative.   Respiratory: Negative for cough, choking, chest tightness, shortness of breath, wheezing and  stridor.   Cardiovascular: Negative for chest pain and leg swelling.  Gastrointestinal: Negative for abdominal pain and nausea.  Genitourinary: Negative.   Musculoskeletal: Negative for arthralgias, joint swelling and neck pain.  Skin: Positive for rash. Negative for wound.  Allergic/Immunologic: Positive for food allergies.  Neurological: Negative for dizziness, weakness, light-headedness, numbness and headaches.  Psychiatric/Behavioral: Negative.      Physical Exam Updated Vital Signs BP 117/90   Pulse 72   Temp 98.2 F (36.8 C)   Resp 20   Ht 5\' 8"  (1.727 m)   Wt (!) 149.7 kg   LMP 12/04/2018   SpO2 100%   BMI 50.18 kg/m   Physical Exam Vitals signs and nursing note reviewed.  Constitutional:      Appearance: She is well-developed.  HENT:     Head: Normocephalic and atraumatic.     Comments: No appreciable facial or periorbital edema.  She has no angioedema, no mouth tongue or oral pharyngeal erythema or edema.  No stridor. Eyes:     Conjunctiva/sclera: Conjunctivae normal.  Neck:     Musculoskeletal: Normal range of motion.  Cardiovascular:     Rate and Rhythm: Normal rate and regular rhythm.     Heart sounds: Normal heart sounds.  Pulmonary:     Effort: Pulmonary effort is normal.     Breath sounds: Normal breath sounds. No wheezing.  Abdominal:     General: Bowel sounds are normal.     Palpations: Abdomen is soft.     Tenderness: There is no abdominal tenderness.  Musculoskeletal: Normal range of motion.  Skin:    General: Skin is warm and dry.     Findings: Rash present. Rash is urticarial.     Comments: Urticaria present on bilateral neck and around ears, fainter abruption on upper chest and forearms.  Neurological:     Mental Status: She is alert.      ED Treatments / Results  Labs (all labs ordered are listed, but only abnormal results are displayed) Labs Reviewed - No data to display  EKG None  Radiology No results found.  Procedures  Procedures (including critical care time)  Medications Ordered in ED Medications  famotidine (PEPCID) tablet 20 mg (20 mg Oral Given 01/15/19 1152)  dexamethasone (DECADRON) injection 10 mg (10 mg Intramuscular Given 01/15/19 1152)  diphenhydrAMINE (BENADRYL) injection 50 mg (50 mg Intramuscular Given 01/15/19 1152)     Initial Impression / Assessment and Plan / ED Course  I have reviewed the triage vital signs and the nursing notes.  Pertinent labs & imaging results that were available during my care of the patient were reviewed by  me and considered in my medical decision making (see chart for details).        Patient was given Pepcid, Decadron and Benadryl here with near resolution of symptoms.  She denies pruritus at this time and her urticarial rash has greatly faded, subtly still present bilateral neck.  No wheezing, no shortness of breath.  She was placed on a prednisone taper, also prescribed Benadryl and Pepcid.  Strict return precautions were discussed.  The patient appears reasonably screened and/or stabilized for discharge and I doubt any other medical condition or other [facility] requiring further screening, evaluation, or treatment in the ED at this time prior to discharge.   Final Clinical Impressions(s) / ED Diagnoses   Final diagnoses:  Hives    ED Discharge Orders         Ordered    predniSONE (DELTASONE) 10 MG tablet     01/15/19 1259    famotidine (PEPCID) 20 MG tablet  2 times daily     01/15/19 1259    diphenhydrAMINE (BENADRYL) 25 MG tablet  Every 6 hours PRN     01/15/19 1259           Burgess Amor, PA-C 01/15/19 1309    Mancel Bale, MD 01/17/19 [phone]

## 2019-01-15 NOTE — Discharge Instructions (Addendum)
Take your next dose of the prednisone tomorrow morning.  Continue use the Pepcid and the Benadryl as prescribed.  You will need 1 more dose of Pepcid before bedtime tonight.  The Benadryl can be taken every 6 hours if needed for return of itching or rash.  Get rechecked if you develop any increasing swelling, particularly mouth or throat swelling or you develop shortness of breath.

## 2019-01-15 NOTE — ED Triage Notes (Signed)
States she began to itch and developed a rash after drinking grapefruit juice

## 2019-02-02 ENCOUNTER — Ambulatory Visit: Payer: Self-pay | Admitting: Obstetrics & Gynecology

## 2019-03-05 ENCOUNTER — Ambulatory Visit: Payer: Self-pay | Admitting: Obstetrics and Gynecology

## 2019-06-04 ENCOUNTER — Ambulatory Visit: Payer: Self-pay | Admitting: Obstetrics & Gynecology

## 2019-07-01 ENCOUNTER — Ambulatory Visit: Payer: Medicaid Other | Admitting: Obstetrics and Gynecology

## 2019-07-03 ENCOUNTER — Ambulatory Visit: Payer: Medicaid Other | Admitting: Obstetrics and Gynecology

## 2019-07-06 NOTE — Progress Notes (Signed)
Angus Palms., MD   Chief Complaint  Patient presents with  . Menstrual Problem    pt is trying to conceive, has PCOS  . Vaginal Discharge    itchiness and irritation, no odor after last cycle stopped    HPI:      [name] is a 29 y.o. G1P0010  whose LMP was Patient's last menstrual period was 06/29/2019 (exact date)., presents today for NP eval of increased vag d/c with irritation, no odor for a week. Hx of yeast vag in past, treated with diflucan. No meds to treat this time. Was on abx a few months ago. Did start new shower gel, uses dryer sheets. No LBP, pelvic pain, fevers.   Pt with hx of PCOS and would like to conceive. Has been trying to get pregnant for over a yr. Menses are Q4-5 months, last 3-4 days, occas BTB for 1-2 days, mild dysmen. Always had irreg menses. Cycles were more regular in past with wt loss and metformin use. Did conceive in 2018 but terminated pregnancy. Pt denies any hx of PID/chlamydia/gon. Partner without STDs/testicular trauma. Partner has 20 you child. No recent semen analysis. Pt would like to conceive quickly but doesn't want to have to involve partner with semen analysis.  Hasn't had recent DM labs.   Current on pap per pt.   PT WITH FP MCD  Past Medical History:  Diagnosis Date  . Anxiety   . Chicken pox   . Chronic abdominal pain   . Chronic pelvic pain in female   . Dysmenorrhea   . Gallstones   . PCOS (polycystic ovarian syndrome)   . Vaginal pain    chronic    Past Surgical History:  Procedure Laterality Date  . CHOLECYSTECTOMY N/A 02/28/2018   Procedure: LAPAROSCOPIC CHOLECYSTECTOMY;  Surgeon: Aviva Signs, MD;  Location: [facility];  Service: General;  Laterality: N/A;    Family History  Problem Relation Age of Onset  . Diabetes Paternal Grandmother     Social History   Socioeconomic History  . Marital status: Single    Spouse name: Not on file  . Number of children: Not on file  . Years of education:  Not on file  . Highest education level: Not on file  Occupational History  . Not on file  Tobacco Use  . Smoking status: Former Smoker    Types: Cigarettes  . Smokeless tobacco: Never Used  Substance and Sexual Activity  . Alcohol use: Yes    Comment: socially   . Drug use: No  . Sexual activity: Yes    Birth control/protection: None  Other Topics Concern  . Not on file  Social History Narrative  . Not on file   Social Determinants of Health   Financial Resource Strain:   . Difficulty of Paying Living Expenses:   Food Insecurity:   . Worried About Charity fundraiser in the Last Year:   . Arboriculturist in the Last Year:   Transportation Needs:   . Film/video editor (Medical):   Marland Kitchen Lack of Transportation (Non-Medical):   Physical Activity:   . Days of Exercise per Week:   . Minutes of Exercise per Session:   Stress:   . Feeling of Stress :   Social Connections:   . Frequency of Communication with Friends and Family:   . Frequency of Social Gatherings with Friends and Family:   . Attends Religious Services:   . Active  Member of Clubs or Organizations:   . Attends Banker Meetings:   Marland Kitchen Marital Status:   Intimate Partner Violence:   . Fear of Current or Ex-Partner:   . Emotionally Abused:   Marland Kitchen Physically Abused:   . Sexually Abused:     Outpatient Medications Prior to Visit  Medication Sig Dispense Refill  . acetaminophen (TYLENOL) 325 MG tablet Take 650 mg by mouth every 6 (six) hours as needed.    . diphenhydrAMINE (BENADRYL) 25 MG tablet Take 1-2 tablets (25-50 mg total) by mouth every 6 (six) hours as needed for itching (and rash). 30 tablet 0  . famotidine (PEPCID) 20 MG tablet Take 1 tablet (20 mg total) by mouth 2 (two) times daily. 14 tablet 0  . predniSONE (DELTASONE) 10 MG tablet Take 6 tablets day one, 5 tablets day two, 4 tablets day three, 3 tablets day four, 2 tablets day five, then 1 tablet day six 21 tablet 0  . traMADol (ULTRAM) 50  MG tablet Take 1 tablet (50 mg total) by mouth every 6 (six) hours as needed. 12 tablet 0   No facility-administered medications prior to visit.      ROS:  Review of Systems  Constitutional: Negative for fever.  Gastrointestinal: Negative for blood in stool, constipation, diarrhea, nausea and vomiting.  Genitourinary: Positive for menstrual problem and vaginal discharge. Negative for dyspareunia, dysuria, flank pain, frequency, hematuria, urgency, vaginal bleeding and vaginal pain.  Musculoskeletal: Negative for back pain.  Skin: Negative for rash.    OBJECTIVE:   Vitals:  BP 120/64   Ht 5\' 8"  (1.727 m)   Wt (!) 327 lb (148.3 kg)   LMP 06/29/2019 (Exact Date)   BMI 49.72 kg/m   Physical Exam Vitals reviewed.  Constitutional:      Appearance: She is well-developed.  Pulmonary:     Effort: Pulmonary effort is normal.  Genitourinary:    General: Normal vulva.     Pubic Area: No rash.      Labia:        Right: No rash, tenderness or lesion.        Left: No rash, tenderness or lesion.      Vagina: Vaginal discharge present. No erythema or tenderness.     Cervix: Normal.     Uterus: Normal. Not enlarged and not tender.      Adnexa: Right adnexa normal and left adnexa normal.       Right: No mass or tenderness.         Left: No mass or tenderness.    Musculoskeletal:        General: Normal range of motion.     Cervical back: Normal range of motion.  Skin:    General: Skin is warm and dry.  Neurological:     General: No focal deficit present.     Mental Status: She is alert and oriented to person, place, and time.  Psychiatric:        Mood and Affect: Mood normal.        Behavior: Behavior normal.        Thought Content: Thought content normal.        Judgment: Judgment normal.     Results: Results for orders placed or performed in visit on 07/07/19 (from the past 24 hour(s))  POCT Wet Prep with KOH     Status: Abnormal   Collection Time: 07/07/19 10:10 AM    Result Value Ref Range   Trichomonas, UA Negative  Clue Cells Wet Prep HPF POC neg    Epithelial Wet Prep HPF POC     Yeast Wet Prep HPF POC few    Bacteria Wet Prep HPF POC     RBC Wet Prep HPF POC     WBC Wet Prep HPF POC     KOH Prep POC Negative Negative     Assessment/Plan: Candidal vaginitis - Plan: fluconazole (DIFLUCAN) 150 MG tablet, POCT Wet Prep with KOH; Pos sx and wet prep. Rx diflucan. F/u prn.   PCOS (polycystic ovarian syndrome)--discussed PCOS and anovulation. Did conceive in past.  Infertility associated with anovulation--discussed clomid after normal semen analysis.  Also discussed metformin and wt loss to increase cycle frequency for conception (worked in past), as well as suggested for healthier pregnancy. Pt would like to try metformin/wt loss. No recent DM screening so will check those labs first. Start PNVs.   Blood tests for routine general physical examination - Plan: Comprehensive metabolic panel, Hemoglobin A1c  Screening for diabetes mellitus - Plan: Hemoglobin A1c  BMI 45.0-49.9, adult (HCC) - Plan: Hemoglobin A1c  PT HAS FP MCD  Meds ordered this encounter  Medications  . fluconazole (DIFLUCAN) 150 MG tablet    Sig: Take 1 tablet (150 mg total) by mouth once for 1 dose.    Dispense:  1 tablet    Refill:  0    Order Specific Question:   Supervising Provider    Answer:   Nadara Mustard [[id number]]      Return if symptoms worsen or fail to improve.  Rhylei Mcquaig B. Paymon Rosensteel, PA-C 07/07/2019 10:16 AM

## 2019-07-07 ENCOUNTER — Encounter: Payer: Self-pay | Admitting: Obstetrics and Gynecology

## 2019-07-07 ENCOUNTER — Other Ambulatory Visit: Payer: Self-pay

## 2019-07-07 ENCOUNTER — Ambulatory Visit (INDEPENDENT_AMBULATORY_CARE_PROVIDER_SITE_OTHER): Payer: Self-pay | Admitting: Obstetrics and Gynecology

## 2019-07-07 VITALS — BP 120/64 | Ht 68.0 in | Wt 327.0 lb

## 2019-07-07 DIAGNOSIS — Z6841 Body Mass Index (BMI) 40.0 and over, adult: Secondary | ICD-10-CM

## 2019-07-07 DIAGNOSIS — E282 Polycystic ovarian syndrome: Secondary | ICD-10-CM

## 2019-07-07 DIAGNOSIS — Z131 Encounter for screening for diabetes mellitus: Secondary | ICD-10-CM

## 2019-07-07 DIAGNOSIS — B373 Candidiasis of vulva and vagina: Secondary | ICD-10-CM

## 2019-07-07 DIAGNOSIS — N970 Female infertility associated with anovulation: Secondary | ICD-10-CM | POA: Insufficient documentation

## 2019-07-07 DIAGNOSIS — Z0000 Encounter for general adult medical examination without abnormal findings: Secondary | ICD-10-CM

## 2019-07-07 DIAGNOSIS — B3731 Acute candidiasis of vulva and vagina: Secondary | ICD-10-CM | POA: Insufficient documentation

## 2019-07-07 LAB — POCT WET PREP WITH KOH
Clue Cells Wet Prep HPF POC: NEGATIVE
KOH Prep POC: NEGATIVE
Trichomonas, UA: NEGATIVE

## 2019-07-07 MED ORDER — FLUCONAZOLE 150 MG PO TABS: 150.0000 mg | ORAL_TABLET | Freq: Once | ORAL | 0 refills | Status: AC

## 2019-07-07 NOTE — Patient Instructions (Signed)
I value your feedback and entrusting us with your care. If you get a [facility] patient survey, I would appreciate you taking the time to let us know about your experience today. Thank you!  As of January 29, 2019, your lab results will be released to your MyChart immediately, before I even have a chance to see them. Please give me time to review them and contact you if there are any abnormalities. Thank you for your patience.  

## 2019-07-13 ENCOUNTER — Ambulatory Visit: Payer: Medicaid Other | Admitting: Obstetrics and Gynecology

## 2019-07-17 ENCOUNTER — Other Ambulatory Visit: Payer: Medicaid Other

## 2019-07-22 ENCOUNTER — Ambulatory Visit: Payer: Medicaid Other | Admitting: Obstetrics and Gynecology

## 2019-09-14 ENCOUNTER — Telehealth: Payer: Self-pay | Admitting: Family Medicine

## 2019-09-14 NOTE — Telephone Encounter (Signed)
Pt has questions about std testing.

## 2019-09-14 NOTE — Telephone Encounter (Signed)
Phone call to pt. Answered some general types of questions about STD screening and time frames to get results. Pt declined appt at this time.

## 2019-09-14 NOTE — Telephone Encounter (Signed)
Phone call to pt. Left voicemail that RN with [facility] is returning phone call, if still need assistance call [phone].

## 2019-09-30 ENCOUNTER — Ambulatory Visit: Payer: Self-pay

## 2019-10-02 ENCOUNTER — Telehealth: Payer: Self-pay | Admitting: Obstetrics and Gynecology

## 2019-10-02 NOTE — Telephone Encounter (Signed)
Patient called stating that she would like a call back from Dr. Emelda Fear or his nurse regarding something they were discussing. Please contact pt. I informed patient that Dr. Emelda Fear is not In the office today and she will not get  A phone call until 2 pm this afternoon. Pt stated that she understood.

## 2019-10-02 NOTE — Telephone Encounter (Signed)
Spoke with patient states she has "clumpiness" She isn't sure if its ovulation. Requesting appt for today. Advised we don't have any openings today and I could transfer her to the front desk to be scheduled.

## 2019-10-05 ENCOUNTER — Other Ambulatory Visit: Payer: Self-pay

## 2019-10-05 ENCOUNTER — Encounter: Payer: Self-pay | Admitting: Obstetrics and Gynecology

## 2019-10-05 ENCOUNTER — Ambulatory Visit (INDEPENDENT_AMBULATORY_CARE_PROVIDER_SITE_OTHER): Payer: Medicaid Other | Admitting: Obstetrics and Gynecology

## 2019-10-05 ENCOUNTER — Other Ambulatory Visit (HOSPITAL_COMMUNITY)
Admission: RE | Admit: 2019-10-05 | Discharge: 2019-10-05 | Disposition: A | Discharge status: Home / Self Care | Payer: Medicaid Other | Source: Ambulatory Visit | Attending: Obstetrics and Gynecology | Admitting: Obstetrics and Gynecology

## 2019-10-05 VITALS — BP 120/70 | Ht 68.0 in | Wt 325.0 lb

## 2019-10-05 DIAGNOSIS — N898 Other specified noninflammatory disorders of vagina: Secondary | ICD-10-CM | POA: Insufficient documentation

## 2019-10-05 DIAGNOSIS — N644 Mastodynia: Secondary | ICD-10-CM

## 2019-10-05 DIAGNOSIS — Z113 Encounter for screening for infections with a predominantly sexual mode of transmission: Secondary | ICD-10-CM | POA: Insufficient documentation

## 2019-10-05 DIAGNOSIS — E282 Polycystic ovarian syndrome: Secondary | ICD-10-CM

## 2019-10-05 LAB — POCT WET PREP WITH KOH
Clue Cells Wet Prep HPF POC: NEGATIVE
KOH Prep POC: NEGATIVE
Trichomonas, UA: NEGATIVE
Yeast Wet Prep HPF POC: NEGATIVE

## 2019-10-05 MED ORDER — CLOTRIMAZOLE-BETAMETHASONE 1-0.05 % EX CREA: TOPICAL_CREAM | CUTANEOUS | 0 refills | Status: DC

## 2019-10-05 NOTE — Progress Notes (Signed)
Josefina Do., MD   Chief Complaint  Patient presents with  . Vaginal Discharge    abnormal odor, itchiness and irritation x 3 weeks   . Breast Exam    tenderness in both breasts x 1 week and a half    HPI:      [name] is a 29 y.o. G1P0010 whose LMP was Patient's last menstrual period was 09/07/2019 (approximate)., presents today for a couple issues. She has had increased vag d/c with irritation particularly at clitoris, no fishy odor, for about 3 wks. No recent abx use, no yeast meds to treat. Was using repHresh about 1-2 wks ago without sx change. Has also noticed vaginal dryness during sex the past month or so; tried lubricants with a little improvement. Treated for yeast vag with diflucan 5/21 with sx relief. She is now washing with water and no longer using dryer sheets.   She is sex active, no new partners. Would like STD testing. Would like to conceive. Hx of PCOS, had spontaneous menses 7/21 for 3-4 days. Usually has menses Q4-5 months. Discussed clomid vs metformin (pt pref) 5/21 and pt due for labs, but not done yet. Would like to do today. Conceived in past with fasting/wt loss.   She has also had nipple tenderness bilat for the past 1 1/2 wks, no masses. No nipple d/c, occas caffeine use.   Past Medical History:  Diagnosis Date  . Anxiety   . Chicken pox   . Chronic abdominal pain   . Chronic pelvic pain in female   . Dysmenorrhea   . Gallstones   . PCOS (polycystic ovarian syndrome)   . Vaginal pain    chronic    Past Surgical History:  Procedure Laterality Date  . CHOLECYSTECTOMY N/A 02/28/2018   Procedure: LAPAROSCOPIC CHOLECYSTECTOMY;  Surgeon: Franky Macho, MD;  Location: [facility];  Service: General;  Laterality: N/A;    Family History  Problem Relation Age of Onset  . Diabetes Paternal Grandmother     Social History   Socioeconomic History  . Marital status: Single    Spouse name: Not on file  . Number of children: Not on  file  . Years of education: Not on file  . Highest education level: Not on file  Occupational History  . Not on file  Tobacco Use  . Smoking status: Former Smoker    Types: Cigarettes  . Smokeless tobacco: Never Used  Vaping Use  . Vaping Use: Never used  Substance and Sexual Activity  . Alcohol use: Yes    Comment: socially   . Drug use: No  . Sexual activity: Yes    Birth control/protection: None, Condom  Other Topics Concern  . Not on file  Social History Narrative  . Not on file   Social Determinants of Health   Financial Resource Strain:   . Difficulty of Paying Living Expenses:   Food Insecurity:   . Worried About Programme researcher, broadcasting/film/video in the Last Year:   . Barista in the Last Year:   Transportation Needs:   . Freight forwarder (Medical):   Marland Kitchen Lack of Transportation (Non-Medical):   Physical Activity:   . Days of Exercise per Week:   . Minutes of Exercise per Session:   Stress:   . Feeling of Stress :   Social Connections:   . Frequency of Communication with Friends and Family:   . Frequency of Social Gatherings with Friends  and Family:   . Attends Religious Services:   . Active Member of Clubs or Organizations:   . Attends Banker Meetings:   Marland Kitchen Marital Status:   Intimate Partner Violence:   . Fear of Current or Ex-Partner:   . Emotionally Abused:   Marland Kitchen Physically Abused:   . Sexually Abused:     No outpatient medications prior to visit.   No facility-administered medications prior to visit.      ROS:  Review of Systems  Constitutional: Negative for fever.  Gastrointestinal: Negative for blood in stool, constipation, diarrhea, nausea and vomiting.  Genitourinary: Positive for vaginal discharge. Negative for dyspareunia, dysuria, flank pain, frequency, hematuria, urgency, vaginal bleeding and vaginal pain.  Musculoskeletal: Negative for back pain.  Skin: Negative for rash.   BREAST: nipple tenderness   OBJECTIVE:    Vitals:  BP 120/70   Ht 5\' 8"  (1.727 m)   Wt (!) 325 lb (147.4 kg)   LMP 09/07/2019 (Approximate)   BMI 49.42 kg/m   Physical Exam Vitals reviewed.  Constitutional:      Appearance: She is well-developed.  Pulmonary:     Effort: Pulmonary effort is normal.  Chest:     Breasts: Breasts are symmetrical.        Right: Tenderness present. No inverted nipple, mass, nipple discharge or skin change.        Left: Tenderness present. No inverted nipple, mass, nipple discharge or skin change.     Comments: NO MASSES BILAT, NO NIPPLE D/C Genitourinary:    General: Normal vulva.     Pubic Area: No rash.      Labia:        Right: No rash, tenderness or lesion.        Left: No rash, tenderness or lesion.      Vagina: Normal. No vaginal discharge, erythema or tenderness.     Cervix: Normal.     Uterus: Normal. Not enlarged and not tender.      Adnexa: Right adnexa normal and left adnexa normal.       Right: No mass or tenderness.         Left: No mass or tenderness.       Comments: ERYTHEMA AT CLITORIS AND INTROITUS; NO EXTRA D/C Musculoskeletal:        General: Normal range of motion.     Cervical back: Normal range of motion.  Skin:    General: Skin is warm and dry.  Neurological:     General: No focal deficit present.     Mental Status: She is alert and oriented to person, place, and time.     Cranial Nerves: No cranial nerve deficit.  Psychiatric:        Mood and Affect: Mood normal.        Behavior: Behavior normal.        Thought Content: Thought content normal.        Judgment: Judgment normal.     Results: Results for orders placed or performed in visit on 10/05/19 (from the past 24 hour(s))  POCT Wet Prep with KOH     Status: Normal   Collection Time: 10/05/19 11:48 AM  Result Value Ref Range   Trichomonas, UA Negative    Clue Cells Wet Prep HPF POC neg    Epithelial Wet Prep HPF POC     Yeast Wet Prep HPF POC neg    Bacteria Wet Prep HPF POC     RBC Wet Prep  HPF POC     WBC Wet Prep HPF POC     KOH Prep POC Negative Negative     Assessment/Plan: Vaginal itching - Plan: Cervicovaginal ancillary only, clotrimazole-betamethasone (LOTRISONE) cream, POCT Wet Prep with KOH, Neg wet prep, pos exam and ext sx. Rx lotrisone crm. Sent to [employer] to use [email or web address] card due to [facility] M[facility]. Check culture for yeast. Will f/u with results.   Screening for STD (sexually transmitted disease) - Plan: Cervicovaginal ancillary only  Nipple pain--for 1 1/2 wks, neg exam. Could be hormonal. D/C caffeine. F/u prn.   PCOS (polycystic ovarian syndrome)--check HgA1C today. Will f/u with results. Discussed keto diet/fasting for wt loss due to insulin resistance. Will also add metformin. If pt wants clomid in future, will need to f/u with MD.   Meds ordered this encounter  Medications  . DISCONTD: clotrimazole-betamethasone (LOTRISONE) cream    Sig: Apply externally BID prn sx up to 2 wks    Dispense:  15 g    Refill:  0    Order Specific Question:   Supervising Provider    Answer:   Nadara Mustard [id number]  . clotrimazole-betamethasone (LOTRISONE) cream    Sig: Apply externally BID prn sx up to 2 wks    Dispense:  15 g    Refill:  0    Order Specific Question:   Supervising Provider    Answer:   Nadara Mustard [[id number]]      Return if symptoms worsen or fail to improve.  Katie Roberson B. Marilouise Densmore, PA-C 10/05/2019 11:51 AM

## 2019-10-05 NOTE — Patient Instructions (Signed)
I value your feedback and entrusting us with your care. If you get a [facility] [facility] patient survey, I would appreciate you taking the time to let us know about your experience today. Thank you!  As of January 29, 2019, your lab results will be released to your MyChart immediately, before I even have a chance to see them. Please give me time to review them and contact you if there are any abnormalities. Thank you for your patience.  

## 2019-10-06 LAB — CERVICOVAGINAL ANCILLARY ONLY
Candida Glabrata: NEGATIVE
Candida Vaginitis: POSITIVE — AB
Chlamydia: NEGATIVE
Comment: NEGATIVE
Comment: NEGATIVE
Comment: NEGATIVE
Comment: NORMAL
Neisseria Gonorrhea: NEGATIVE

## 2019-10-06 MED ORDER — FLUCONAZOLE 150 MG PO TABS: 150.0000 mg | ORAL_TABLET | Freq: Once | ORAL | 0 refills | Status: AC

## 2019-10-06 NOTE — Addendum Note (Signed)
Addended by: Althea Grimmer B on: 10/06/2019 03:28 PM   Modules accepted: Orders

## 2019-12-09 ENCOUNTER — Encounter: Payer: Medicaid Other | Admitting: Obstetrics and Gynecology

## 2020-01-05 ENCOUNTER — Ambulatory Visit: Payer: Medicaid Other

## 2020-04-22 ENCOUNTER — Other Ambulatory Visit: Payer: Self-pay

## 2020-04-22 ENCOUNTER — Ambulatory Visit (INDEPENDENT_AMBULATORY_CARE_PROVIDER_SITE_OTHER): Payer: Self-pay | Admitting: Obstetrics and Gynecology

## 2020-04-22 ENCOUNTER — Encounter: Payer: Self-pay | Admitting: Obstetrics and Gynecology

## 2020-04-22 ENCOUNTER — Other Ambulatory Visit (HOSPITAL_COMMUNITY)
Admission: RE | Admit: 2020-04-22 | Discharge: 2020-04-22 | Disposition: A | Discharge status: Home / Self Care | Payer: Medicaid Other | Source: Ambulatory Visit | Attending: Obstetrics and Gynecology | Admitting: Obstetrics and Gynecology

## 2020-04-22 VITALS — BP 120/80 | Ht 68.0 in | Wt 336.0 lb

## 2020-04-22 DIAGNOSIS — Z124 Encounter for screening for malignant neoplasm of cervix: Secondary | ICD-10-CM | POA: Insufficient documentation

## 2020-04-22 DIAGNOSIS — Z113 Encounter for screening for infections with a predominantly sexual mode of transmission: Secondary | ICD-10-CM | POA: Diagnosis present

## 2020-04-22 DIAGNOSIS — N760 Acute vaginitis: Secondary | ICD-10-CM

## 2020-04-22 DIAGNOSIS — E282 Polycystic ovarian syndrome: Secondary | ICD-10-CM

## 2020-04-22 DIAGNOSIS — B373 Candidiasis of vulva and vagina: Secondary | ICD-10-CM

## 2020-04-22 DIAGNOSIS — Z131 Encounter for screening for diabetes mellitus: Secondary | ICD-10-CM

## 2020-04-22 DIAGNOSIS — B3731 Acute candidiasis of vulva and vagina: Secondary | ICD-10-CM

## 2020-04-22 LAB — POCT WET PREP (WET MOUNT)
Clue Cells Wet Prep Whiff POC: NEGATIVE
Trichomonas Wet Prep HPF POC: ABSENT

## 2020-04-22 MED ORDER — FLUCONAZOLE 150 MG PO TABS: 150.0000 mg | ORAL_TABLET | Freq: Once | ORAL | 0 refills | Status: AC

## 2020-04-22 NOTE — Progress Notes (Signed)
Patient ID: Katie Roberson, female   DOB: 09-28-1990, 30 y.o.   MRN: [id number]  Reason for Consult: Vaginal Discharge (Little itchiness, fishy odor, no irritation x couple of weeks)   Referred by Josefina Do., MD  Subjective:     HPI:  Katie Roberson is a 30 y.o. female who presents with a concern for vaginal discharge for the last 1-2 weeks. Patient states is has a bit of an "odor" and reports symptoms of vaginal itching. Additionally, patient report persistent symptoms of vaginal dryness since her last visit. The patient also requests completing hgb A1C testing which had been ordered but not collected at her previous visit. Patient accepts STI screening today. Patient is also due for a pap smear and is amendable to collecting this today. Patient denies current use of contraception.  Gynecological History Menopause: n/a LMP: 03/22/20 Describes periods as becoming more regularly, currently occurring monthly Last pap smear: 02/29/16 - NILM Last Mammogram: n/a History of STDs: none Sexually Active: yes  Obstetrical History G1P0010  Past Medical History:  Diagnosis Date  . Anxiety   . Chicken pox   . Chronic abdominal pain   . Chronic pelvic pain in female   . Dysmenorrhea   . Gallstones   . PCOS (polycystic ovarian syndrome)   . Vaginal pain    chronic   Family History  Problem Relation Age of Onset  . Diabetes Paternal Grandmother    Past Surgical History:  Procedure Laterality Date  . CHOLECYSTECTOMY N/A 02/28/2018   Procedure: LAPAROSCOPIC CHOLECYSTECTOMY;  Surgeon: Katie Macho, MD;  Location: [facility];  Service: General;  Laterality: N/A;   Short Social History:  Social History   Tobacco Use  . Smoking status: Former Smoker    Types: Cigarettes  . Smokeless tobacco: Never Used  Substance Use Topics  . Alcohol use: Yes    Comment: socially     Allergies  Allergen Reactions  . Grapefruit Concentrate   . Grapefruit Extract Other (See  Comments)  . Kiwi Extract Hives    Patient is allergic to kiwi. Causes facial swelling Patient is allergic to kiwi. Causes facial swelling    Current Outpatient Medications  Medication Sig Dispense Refill  . fluconazole (DIFLUCAN) 150 MG tablet Take 1 tablet (150 mg total) by mouth once for 1 dose. Repeat dose after 72 hours if symptoms persist. 2 tablet 0   No current facility-administered medications for this visit.    Review of Systems  Constitutional:  Constitutional negative. HENT: HENT negative.  Eyes: Eyes negative.  Cardiovascular: Cardiovascular negative.  GI: Positive for diarrhea.  GU:       Vaginal discharge, dryness, and vaginal itching Musculoskeletal: Positive for joint pain.  Skin: Skin negative.  Neurological: Neurological negative. Hematologic: Hematologic/lymphatic negative.        Objective:  Objective   Vitals:   04/22/20 1343  BP: 120/80  Weight: (!) 336 lb (152.4 kg)  Height: 5\' 8"  (1.727 m)   Body mass index is 51.09 kg/m.  Physical Exam Vitals reviewed.  Constitutional:      Appearance: She is obese.  HENT:     Head: Normocephalic.  Eyes:     Pupils: Pupils are equal, round, and reactive to light.  Pulmonary:     Effort: Pulmonary effort is normal.  Abdominal:     Palpations: Abdomen is soft.  Genitourinary:    Comments: External: Normal appearing vulva. No lesions noted.  Speculum examination: Normal appearing cervix. No blood in  the vaginal vault. Small amount of white discharge.     Skin:    General: Skin is warm and dry.  Neurological:     General: No focal deficit present.     Mental Status: She is alert and oriented to person, place, and time.  Psychiatric:        Mood and Affect: Mood normal.        Behavior: Behavior normal.    Recent Results  POCT Wet Prep Mell[place])     Status: Abnormal   Collection Time: 04/22/20  2:36 PM  Result Value Ref Range   Source Wet Prep POC vaginal    WBC, Wet Prep HPF POC none     Bacteria Wet Prep HPF POC Few Few   BACTERIA WET PREP MORPHOLOGY POC     Clue Cells Wet Prep HPF POC None None   Clue Cells Wet Prep Whiff POC Negative Whiff    Yeast Wet Prep HPF POC Moderate (A) None   KOH Wet Prep POC Moderate (A) None   Trichomonas Wet Prep HPF POC Absent Absent   Assessment/Plan:    30 yo, G1P0010, with acute candida vaginitis (wet mount positive for yeast; diflucan Rx'd). Requesting STI screening. Pap collected. Patient desires HgbA1C screening which had been previously been offered but not collected.  Problem List Items Addressed This Visit      Endocrine   PCOS (polycystic ovarian syndrome)   Relevant Orders   Hgb A1c w/o eAG    Other Visit Diagnoses    Screen for sexually transmitted diseases    -  Primary   Relevant Orders   Cervicovaginal ancillary only   Acute vaginitis       Relevant Orders   POCT Wet Prep [place]) (Completed)   Candida vaginitis       Relevant Medications   fluconazole (DIFLUCAN) 150 MG tablet   Screening for diabetes mellitus       Relevant Orders   Hgb A1c w/o eAG   Screening for cervical cancer       Relevant Orders   Cytology - PAP        Katie Roberson, CNM [facility], [facility] 04/22/2020 2:37 PM

## 2020-04-23 LAB — HGB A1C W/O EAG: Hgb A1c MFr Bld: 5.1 % (ref 4.8–5.6)

## 2020-04-26 LAB — CERVICOVAGINAL ANCILLARY ONLY
Chlamydia: NEGATIVE
Comment: NEGATIVE
Comment: NEGATIVE
Comment: NORMAL
Neisseria Gonorrhea: NEGATIVE
Trichomonas: NEGATIVE

## 2020-04-26 LAB — CYTOLOGY - PAP
Adequacy: ABSENT
Diagnosis: NEGATIVE

## 2020-05-12 ENCOUNTER — Telehealth: Payer: Self-pay

## 2020-05-12 NOTE — Telephone Encounter (Signed)
Pt returned call; is aware of normal cultures and pap; normal Hgb A1c.  Pt asked about an u/s for ck ovarian cyst(s) size and infertility blood work.  Adv will send msg to [facility].

## 2020-05-12 NOTE — Telephone Encounter (Signed)
Pt aware will call back to schedule

## 2020-05-12 NOTE — Telephone Encounter (Signed)
The patient will need to schedule a visit to come in and discuss infertility work up - she should schedule this visit with an MD. Thanks.

## 2020-05-12 NOTE — Telephone Encounter (Signed)
Pt calling for results.  [phone] Mailbox is full.

## 2020-05-31 ENCOUNTER — Encounter: Payer: Self-pay | Admitting: Emergency Medicine

## 2020-05-31 ENCOUNTER — Emergency Department
Admission: EM | Admit: 2020-05-31 | Discharge: 2020-05-31 | Disposition: A | Discharge status: Home / Self Care | Payer: Medicaid Other | Attending: Emergency Medicine | Admitting: Emergency Medicine

## 2020-05-31 DIAGNOSIS — R519 Headache, unspecified: Secondary | ICD-10-CM | POA: Insufficient documentation

## 2020-05-31 DIAGNOSIS — N644 Mastodynia: Secondary | ICD-10-CM | POA: Insufficient documentation

## 2020-05-31 DIAGNOSIS — R110 Nausea: Secondary | ICD-10-CM | POA: Insufficient documentation

## 2020-05-31 DIAGNOSIS — Z87891 Personal history of nicotine dependence: Secondary | ICD-10-CM | POA: Insufficient documentation

## 2020-05-31 LAB — COMPREHENSIVE METABOLIC PANEL
ALT: 16 U/L (ref 0–44)
AST: 23 U/L (ref 15–41)
Albumin: 3.8 g/dL (ref 3.5–5.0)
Alkaline Phosphatase: 73 U/L (ref 38–126)
Anion gap: 7 (ref 5–15)
BUN: 15 mg/dL (ref 6–20)
CO2: 23 mmol/L (ref 22–32)
Calcium: 8.9 mg/dL (ref 8.9–10.3)
Chloride: 106 mmol/L (ref 98–111)
Creatinine, Ser: 0.96 mg/dL (ref 0.44–1.00)
GFR, Estimated: >60 mL/min (ref >60)
Glucose, Bld: 95 mg/dL (ref 70–99)
Potassium: 3.7 mmol/L (ref 3.5–5.1)
Sodium: 136 mmol/L (ref 135–145)
Total Bilirubin: 0.4 mg/dL (ref 0.3–1.2)
Total Protein: 7.1 g/dL (ref 6.5–8.1)

## 2020-05-31 LAB — CBC
HCT: 36.2 % (ref 36.0–46.0)
Hemoglobin: 12.2 g/dL (ref 12.0–15.0)
MCH: 30 pg (ref 26.0–34.0)
MCHC: 33.7 g/dL (ref 30.0–36.0)
MCV: 88.9 fL (ref 80.0–100.0)
Platelets: 275 10*3/uL (ref 150–400)
RBC: 4.07 MIL/uL (ref 3.87–5.11)
RDW: 13 % (ref 11.5–15.5)
WBC: 13.5 10*3/uL — ABNORMAL HIGH (ref 4.0–10.5)
nRBC: 0 % (ref 0.0–0.2)

## 2020-05-31 LAB — URINALYSIS, COMPLETE (UACMP) WITH MICROSCOPIC
Bacteria, UA: NONE SEEN
Bilirubin Urine: NEGATIVE
Glucose, UA: NEGATIVE mg/dL
Hgb urine dipstick: NEGATIVE
Ketones, ur: NEGATIVE mg/dL
Leukocytes,Ua: NEGATIVE
Nitrite: NEGATIVE
Protein, ur: NEGATIVE mg/dL
Specific Gravity, Urine: 1.02 (ref 1.005–1.030)
pH: 6 (ref 5.0–8.0)

## 2020-05-31 LAB — PREGNANCY, URINE: Preg Test, Ur: NEGATIVE

## 2020-05-31 LAB — LIPASE, BLOOD: Lipase: 51 U/L (ref 11–51)

## 2020-05-31 NOTE — ED Provider Notes (Signed)
[facility] Emergency Department Provider Note   ____________________________________________   Event Date/Time   First MD Initiated Contact with Patient 05/31/20 2227     (approximate)  I have reviewed the triage vital signs and the nursing notes.   HISTORY  Chief Complaint Breast Pain, Nausea, and Headache    HPI Katie Roberson is a 30 y.o. female with a past medical history of anxiety and PCOS who presents for bilateral breast tenderness that began approximately 1 week prior to arrival.  Patient states that her last menstrual cycle was over a month ago.  Patient states that she may be pregnant at this time.  Patient's states that disorder pain is happened before before she has.  Secondary to her PCOS.  Patient currently denies any vision changes, tinnitus, difficulty speaking, facial droop, sore throat, shortness of breath, abdominal pain, nausea/vomiting/diarrhea, dysuria, or weakness/numbness/paresthesias in any extremity         Past Medical History:  Diagnosis Date  . Anxiety   . Chicken pox   . Chronic abdominal pain   . Chronic pelvic pain in female   . Dysmenorrhea   . Gallstones   . PCOS (polycystic ovarian syndrome)   . Vaginal pain    chronic    Patient Active Problem List   Diagnosis Date Noted  . 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 Candidal vaginitis 07/07/2019  . Calculus of gallbladder with acute cholecystitis without obstruction   . Cholecystitis 02/27/2018  . Acute cholecystitis   . Abnormal uterine bleeding (AUB) 02/08/2016  . Screening examination for STD (sexually transmitted disease) 02/08/2016  . Abnormal vaginal bleeding 01/29/2013  . PCOS (polycystic ovarian syndrome) 12/26/2012  . Morbid obesity (HCC) 07/16/2012    Past Surgical History:  Procedure Laterality Date  . CHOLECYSTECTOMY N/A 02/28/2018   Procedure: LAPAROSCOPIC CHOLECYSTECTOMY;  Surgeon: Franky Macho, MD;  Location: [facility];   Service: General;  Laterality: N/A;    Prior to Admission medications   Medication Sig Start Date End Date Taking? Authorizing Provider  fluconazole (DIFLUCAN) 150 MG tablet Take by mouth. Patient not taking: Reported on 05/31/2020 04/22/20   [provider]    Allergies Grapefruit concentrate, Grapefruit extract, and Kiwi extract  Family History  Problem Relation Age of Onset  . Diabetes Paternal Grandmother     Social History Social History   Tobacco Use  . Smoking status: Former Smoker    Types: Cigarettes  . Smokeless tobacco: Never Used  Vaping Use  . Vaping Use: Never used  Substance Use Topics  . Alcohol use: Yes    Comment: socially   . Drug use: No    Review of Systems Constitutional: No fever/chills Eyes: No visual changes. ENT: No sore throat. Cardiovascular: Denies chest pain. Respiratory: Denies shortness of breath. Gastrointestinal: No abdominal pain.  No nausea, no vomiting.  No diarrhea. Genitourinary: Negative for dysuria. Musculoskeletal: Negative for acute arthralgias.  Bilateral breast pain Skin: Negative for rash. Neurological: Negative for headaches, weakness/numbness/paresthesias in any extremity Psychiatric: Negative for suicidal ideation/homicidal ideation   ____________________________________________   PHYSICAL EXAM:  VITAL SIGNS: ED Triage Vitals [05/31/20 2202]  Enc Vitals Group     BP (!) 152/90     Pulse Rate 78     Resp 17     Temp 98.1 F (36.7 C)     Temp Source Oral     SpO2 100 %     Weight (!) 325 lb (147.4 kg)     Height  5\' 8"  (1.727 m)     Head Circumference      Peak Flow      Pain Score      Pain Loc      Pain Edu?      Excl. in GC?    Constitutional: Alert and oriented. Well appearing and in no acute distress. Eyes: Conjunctivae are normal. PERRL. Head: Atraumatic. Nose: No congestion/rhinnorhea. Mouth/Throat: Mucous membranes are moist. Neck: No stridor Cardiovascular: Grossly normal heart  sounds.  Good peripheral circulation. Respiratory: Normal respiratory effort.  No retractions. Gastrointestinal: Soft and nontender. No distention. Musculoskeletal: No obvious deformities Neurologic:  Normal speech and language. No gross focal neurologic deficits are appreciated. Skin:  Skin is warm and dry. No rash noted. Psychiatric: Mood and affect are normal. Speech and behavior are normal.  ____________________________________________   LABS (all labs ordered are listed, but only abnormal results are displayed)  Labs Reviewed  CBC - Abnormal; Notable for the following components:      Result Value   WBC 13.5 (*)    All other components within normal limits  URINALYSIS, COMPLETE (UACMP) WITH MICROSCOPIC - Abnormal; Notable for the following components:   Color, Urine YELLOW (*)    APPearance CLEAR (*)    All other components within normal limits  PREGNANCY, URINE  LIPASE, BLOOD  COMPREHENSIVE METABOLIC PANEL    PROCEDURES  Procedure(s) performed (including Critical Care):  Procedures   ____________________________________________   INITIAL IMPRESSION / ASSESSMENT AND PLAN / ED COURSE  As part of my medical decision making, I reviewed the following data within the electronic MEDICAL RECORD NUMBER Nursing notes reviewed and incorporated, Labs reviewed, EKG interpreted, Old chart reviewed, Radiograph reviewed and Notes from prior ED visits reviewed and incorporated        Patient is a 30 year old female who presents for acute bilateral breast pain over the last week has associated with nausea and headache.  Patient's pregnancy test negative and patient requested to be discharged prior to full laboratory work-up however she does have a slight white blood cell count of 13.5.  Patient denies any fevers or other signs of infection at this time.  Patient agrees to follow-up with her primary care physician in the next 1-3 days.  Patient's vital signs stable and agrees to return  precautions      ____________________________________________   FINAL CLINICAL IMPRESSION(S) / ED DIAGNOSES  Final diagnoses:  Acute pain of breast     ED Discharge Orders    None       Note:  This document was prepared using Dragon voice recognition software and may include unintentional dictation errors.   37, MD 05/31/20 [phone]

## 2020-05-31 NOTE — ED Triage Notes (Signed)
Pt c/o breast tenderness, nausea and HA x1 week. Last known menstrual cycle, approx 1 month. Unknown but possible pregnancy when asked.

## 2020-05-31 NOTE — ED Notes (Signed)
E-sign not working, Arboriculturist and signed

## 2020-08-03 ENCOUNTER — Telehealth: Payer: Self-pay

## 2020-08-03 NOTE — Telephone Encounter (Signed)
Pt calling; has medical question.  [phone]  Pt wanted to know if being on metformin would make it harder for her to conceive.  Adv it would not.

## 2020-08-09 ENCOUNTER — Ambulatory Visit: Payer: Medicaid Other | Admitting: Physician Assistant

## 2020-08-09 ENCOUNTER — Other Ambulatory Visit: Payer: Self-pay

## 2020-08-09 DIAGNOSIS — Z113 Encounter for screening for infections with a predominantly sexual mode of transmission: Secondary | ICD-10-CM

## 2020-08-09 LAB — WET PREP FOR TRICH, YEAST, CLUE
Trichomonas Exam: NEGATIVE
Yeast Exam: NEGATIVE

## 2020-08-10 ENCOUNTER — Ambulatory Visit: Payer: Medicaid Other | Admitting: Obstetrics and Gynecology

## 2020-08-10 ENCOUNTER — Encounter: Payer: Self-pay | Admitting: Physician Assistant

## 2020-08-10 NOTE — Progress Notes (Signed)
[facility] Department STI clinic/screening visit  Subjective:  Katie Roberson is a 30 y.o. female being seen today for an STI screening visit. The patient reports they do have symptoms.  Patient reports that they do not desire a pregnancy in the next year.   They reported they are not interested in discussing contraception today.  No LMP recorded.   Patient has the following medical conditions:   Patient Active Problem List   Diagnosis Date Noted   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   Candidal vaginitis 07/07/2019   Calculus of gallbladder with acute cholecystitis without obstruction    Cholecystitis 02/27/2018   Acute cholecystitis    Abnormal uterine bleeding (AUB) 02/08/2016   Screening examination for STD (sexually transmitted disease) 02/08/2016   Abnormal vaginal bleeding 01/29/2013   PCOS (polycystic ovarian syndrome) 12/26/2012   Morbid obesity (HCC) 07/16/2012    Chief Complaint  Patient presents with   SEXUALLY TRANSMITTED DISEASE    screening    HPI  Patient reports that she has had some cramping and vaginal odor for 2-3 weeks.  Denies other symptoms and regular medicines.  Reports that she has PCOS and irregular periods with this condition.   Reports last HIV test was in February of this year and last pap was also in either January or February of this year.  States that she is not using any method of birth control.    See flowsheet for further details and programmatic requirements.    The following portions of the patient's history were reviewed and updated as appropriate: allergies, current medications, past medical history, past social history, past surgical history and problem list.  Objective:  There were no vitals filed for this visit.  Physical Exam Constitutional:      General: She is not in acute distress.    Appearance: Normal appearance. She is obese.  HENT:     Head: Normocephalic and atraumatic.     Comments:  No nits,lice, or hair loss. No cervical, supraclavicular or axillary adenopathy.     Mouth/Throat:     Mouth: Mucous membranes are moist.     Pharynx: Oropharynx is clear. No oropharyngeal exudate or posterior oropharyngeal erythema.  Eyes:     Conjunctiva/sclera: Conjunctivae normal.  Pulmonary:     Effort: Pulmonary effort is normal.  Abdominal:     Palpations: Abdomen is soft. There is no mass.     Tenderness: There is no abdominal tenderness. There is no guarding or rebound.  Genitourinary:    General: Normal vulva.     Rectum: Normal.     Comments: External genitalia/pubic area without nits, lice, edema, erythema, lesions and inguinal adenopathy. Vagina with normal mucosa and discharge. Cervix without visible lesions. Uterus firm, mobile, nt, no masses, no CMT, no adnexal tenderness or fullness.  Musculoskeletal:     Cervical back: Neck supple. No tenderness.  Skin:    General: Skin is warm and dry.     Findings: No bruising, erythema, lesion or rash.  Neurological:     Mental Status: She is alert and oriented to person, place, and time.  Psychiatric:        Mood and Affect: Mood normal.        Behavior: Behavior normal.        Thought Content: Thought content normal.        Judgment: Judgment normal.     Assessment and Plan:  Katie Roberson is a 30 y.o. female presenting to the [facility] Department for STI screening  1. Screening for STD (sexually transmitted disease) Patient into clinic with symptoms. Reviewed with patient that wet mount results are normal and no treatment is indicated today. Reviewed with patient ways to prevent BV and yeast infections. Rec condoms with all sex. Await test results.  Counseled that RN will call if needs to RTC for treatment once results are back.  - WET PREP FOR TRICH, YEAST, CLUE - Gonococcus culture - Chlamydia/Gonorrhea [facility] - HIV [place] [facility] - Syphilis Serology, [place] [facility]     No follow-ups  on file.  Future Appointments  Date Time Provider Department Center  08/29/2020  1:30 PM Conard Novak, MD [facility] None    Matt Holmes, [place]

## 2020-08-14 LAB — GONOCOCCUS CULTURE

## 2020-08-29 ENCOUNTER — Ambulatory Visit: Payer: Medicaid Other | Admitting: Obstetrics and Gynecology

## 2020-10-19 ENCOUNTER — Telehealth: Payer: Self-pay

## 2020-10-19 NOTE — Telephone Encounter (Signed)
Pt calling; has questions about PCOS and medication.  [phone]  Pt's questions were about conceiving, clomid, and being on bc.  Adv it would be better to write her questions down and bring them with her to appt on the 12th; better to have this conversation face to face.

## 2020-10-31 ENCOUNTER — Encounter: Payer: Self-pay | Admitting: Obstetrics and Gynecology

## 2020-10-31 ENCOUNTER — Other Ambulatory Visit: Payer: Self-pay

## 2020-10-31 ENCOUNTER — Ambulatory Visit (INDEPENDENT_AMBULATORY_CARE_PROVIDER_SITE_OTHER): Payer: Medicaid Other | Admitting: Obstetrics and Gynecology

## 2020-10-31 VITALS — BP 126/84 | Ht 68.0 in | Wt 314.0 lb

## 2020-10-31 DIAGNOSIS — N760 Acute vaginitis: Secondary | ICD-10-CM | POA: Diagnosis not present

## 2020-10-31 DIAGNOSIS — B9689 Other specified bacterial agents as the cause of diseases classified elsewhere: Secondary | ICD-10-CM | POA: Diagnosis not present

## 2020-10-31 DIAGNOSIS — Z3169 Encounter for other general counseling and advice on procreation: Secondary | ICD-10-CM

## 2020-10-31 DIAGNOSIS — N970 Female infertility associated with anovulation: Secondary | ICD-10-CM | POA: Diagnosis not present

## 2020-10-31 DIAGNOSIS — E282 Polycystic ovarian syndrome: Secondary | ICD-10-CM | POA: Diagnosis not present

## 2020-10-31 MED ORDER — METRONIDAZOLE 500 MG PO TABS: 500.0000 mg | ORAL_TABLET | Freq: Two times a day (BID) | ORAL | 0 refills | Status: AC

## 2020-10-31 NOTE — Progress Notes (Signed)
Gynecology H&P  PCP: Josefina Do., MD  Chief Complaint  Patient presents with   Infertility   History of Present Illness: Patient is a 30 y.o. G1P0010 presenting for evaluation of infertility. Patient and partner have been attempting conception for 2 months. Marital Status: single/dating. Pregnancies with current partner no  Sexual History Dyspareunia: maybe a little Use of Lubricant: sometimes due to a little dryness Douching: no  Ovulatory Evaluation LMP: Patient's last menstrual period was 10/17/2020 (exact date). Menarche:11 Shortest Interval: 1 week Longest Interval: 3 months Duration of flow: 2-3 days (is random overall) Heavy Menses: sometimes heavy, sometimes light (usually one day of heavy, the rest are light) Clots: occasionally Ovulation Predictor Kits Positive  no Intermenstrual Bleeding: hard to tell Dysmenorrhea: mild Amenorrhea:  no Wt Change: no Hirsutism: yes, a little on her chin Balding: no Acne: a little bit of pimples that come and go Galactorrhea: no Hot Flashes: yes Meds: no Other Therapies: Not applicable  Tubal Factor Previous abdominal or pelvic surgery: no Pelvic Pain:  no Endometriosis: no STD: no PID: no Laparoscopy: no Prior HSG: no  Female Factor Sired prior conception:  no Semen analysis: no  Contraception no  Contributing Habits Cigarettes:    Wife -  no    Husband - no Alcohol:    Wife -  social    Husband - no Marijuana: never  She does have a history of diagnosed PCOS. In the past she used metformin to help get pregnant.     Review of Systems  Constitutional: Negative.   HENT: Negative.    Eyes: Negative.   Respiratory: Negative.    Cardiovascular: Negative.   Gastrointestinal: Negative.   Genitourinary: Negative.   Musculoskeletal: Negative.   Skin: Negative.   Neurological: Negative.   Psychiatric/Behavioral: Negative.     Past Medical History:  Past Medical History:  Diagnosis Date    Anxiety    Chicken pox    Chronic abdominal pain    Chronic pelvic pain in female    Dysmenorrhea    Gallstones    PCOS (polycystic ovarian syndrome)    Vaginal pain    chronic    Past Surgical History:  Procedure Laterality Date   CHOLECYSTECTOMY N/A 02/28/2018   Procedure: LAPAROSCOPIC CHOLECYSTECTOMY;  Surgeon: Franky Macho, MD;  Location: [facility];  Service: General;  Laterality: N/A;    Obstetric History:  G1 s/p EAB  Family History  Problem Relation Age of Onset   Diabetes Paternal Grandmother    Thyroid Problems: no Heart Condition or High Blood Pressure: no Blood Clot or Stroke: no Diabetes: no Cancer: no Birth Defects/Inherited diseases:no Infectious diseases (mumps, TB, Rubella):no Other Medical Problems: PCOS MR/autism/fragile X or POF: no   Social History   Socioeconomic History   Marital status: Single    Spouse name: Not on file   Number of children: Not on file   Years of education: Not on file   Highest education level: Not on file  Occupational History   Not on file  Tobacco Use   Smoking status: Former    Types: Cigarettes   Smokeless tobacco: Never  Vaping Use   Vaping Use: Never used  Substance and Sexual Activity   Alcohol use: Yes    Comment: socially    Drug use: No   Sexual activity: Yes    Partners: Male    Birth control/protection: None  Other Topics Concern   Not on file  Social History Narrative  Not on file   Social Determinants of Health   Financial Resource Strain: Not on file  Food Insecurity: Not on file  Transportation Needs: Not on file  Physical Activity: Not on file  Stress: Not on file  Social Connections: Not on file  Intimate Partner Violence: Not on file    Allergies:  Allergies  Allergen Reactions   Grapefruit Concentrate    Grapefruit Extract Other (See Comments)   Kiwi Extract Hives    Patient is allergic to kiwi. Causes facial swelling Patient is allergic to kiwi. Causes facial swelling     Medications: Prior to Admission medications   Medication Sig Start Date End Date Taking? Authorizing Provider  fluconazole (DIFLUCAN) 150 MG tablet Take by mouth. Patient not taking: No sig reported 04/22/20   [provider]    Physical Exam Vitals: Blood pressure 126/84, height 5\' 8"  (1.727 m), weight (!) 314 lb (142.4 kg), last menstrual period 10/17/2020., Body mass index is 47.74 kg/m.  Physical Exam Constitutional:      General: She is not in acute distress.    Appearance: Normal appearance.  HENT:     Head: Normocephalic and atraumatic.  Eyes:     General: No scleral icterus.    Conjunctiva/sclera: Conjunctivae normal.  Neurological:     General: No focal deficit present.     Mental Status: She is alert and oriented to person, place, and time.     Cranial Nerves: No cranial nerve deficit.  Psychiatric:        Mood and Affect: Mood normal.        Behavior: Behavior normal.        Judgment: Judgment normal.     Assessment: 30 y.o. G1P0010 female here for: 1. Infertility associated with anovulation   2. PCOS (polycystic ovarian syndrome)   3. Bacterial vaginosis       Plan: 1) We discussed the underlying etiologies which may be implicated in a couple experiencing difficulty conceiving.  The average couple will conceive within the span of 1 year with unprotected coitus, with a monthly fecundity rate of 20% or 1 in 5.  Even without further work up or intervention the patient and her partner may be successful in conceiving unassisted, although if an underlying etiology can be identified and addressed fecundity rate may improve.  The work up entails examining for ovulatory function, tubal patency, and ruling out female factor infertility.  These may be looked at concurrently or sequentially.  The downside of sequential work up is that this method may miss issues if more than one compartment is contributing.  She is aware that tubal factor or moderate to severe female  factor infertility will require further consultation with a reproductive endocrinologist.  In the case of anovulation, use of Clomid (clomiphen citrate) or Femara (letrazole) were discussed with the understanding the the later is an off-label, but well supported use.  With either of these drugs the risk of multiples increases from the standard population rate of 2% to approximately 10%, with higher order multiples possible but unlikely.  Both drugs may require some time to titrate to the appropriate dosage to ensure consistent ovulation.  Cycles will be limited to 6 cycles on each drug secondary to decreasing rates of conception after 6 cycles.  In addition should patient be started on ovulation induction with Clomid she was advised to discontinue the drug for any vision changes as this is a rare but potentially permanent side-effect if medication is continued.  We  discussed timing of intercourse as well as the use of ovulation predictor kits identify the patient's fertile window each month.     2) Preconception counseling- immunization up to date.  The patient denies any family history of conditions which would warrant preconception genetic counseling or testing on her or her partner.  Instructed to start prenatal vitamins while trying to conceive.    BV treated hemoglobin A1c today progesterone 1 week.  A total of 47 minutes were spent face-to-face with the patient as well as preparation, review, communication, and documentation during this encounter.     Thomasene Mohair, MD, [facility], [facility] 11/01/2020 10:28 AM

## 2020-11-07 ENCOUNTER — Other Ambulatory Visit: Payer: Medicaid Other

## 2020-11-12 ENCOUNTER — Other Ambulatory Visit: Payer: Self-pay

## 2020-11-12 ENCOUNTER — Emergency Department
Admission: EM | Admit: 2020-11-12 | Discharge: 2020-11-12 | Disposition: A | Discharge status: Home / Self Care | Payer: Medicaid Other | Attending: Emergency Medicine | Admitting: Emergency Medicine

## 2020-11-12 DIAGNOSIS — Z87891 Personal history of nicotine dependence: Secondary | ICD-10-CM | POA: Insufficient documentation

## 2020-11-12 DIAGNOSIS — R202 Paresthesia of skin: Secondary | ICD-10-CM | POA: Insufficient documentation

## 2020-11-12 DIAGNOSIS — L509 Urticaria, unspecified: Secondary | ICD-10-CM | POA: Insufficient documentation

## 2020-11-12 MED ORDER — DIPHENHYDRAMINE HCL 50 MG/ML IJ SOLN
50.0000 mg | Freq: Once | INTRAMUSCULAR | Status: AC
  Administered 2020-11-12: 50 mg via INTRAVENOUS
  Filled 2020-11-12: qty 1

## 2020-11-12 MED ORDER — FAMOTIDINE 20 MG IN NS 100 ML IVPB
20.0000 mg | Freq: Once | INTRAVENOUS | Status: AC
  Administered 2020-11-12: 20 mg via INTRAVENOUS
  Filled 2020-11-12: qty 100

## 2020-11-12 MED ORDER — EPINEPHRINE 0.3 MG/0.3ML IJ SOAJ: 0.3000 mg | INTRAMUSCULAR | 1 refills | Status: AC | PRN

## 2020-11-12 MED ORDER — METHYLPREDNISOLONE SODIUM SUCC 125 MG IJ SOLR
125.0000 mg | Freq: Once | INTRAMUSCULAR | Status: AC
  Administered 2020-11-12: 125 mg via INTRAVENOUS
  Filled 2020-11-12: qty 2

## 2020-11-12 NOTE — ED Provider Notes (Addendum)
[facility] Emergency Department Provider Note  ____________________________________________  Time seen: Approximately 3:23 AM  I have reviewed the triage vital signs and the nursing notes.   HISTORY  Chief Complaint Allergic Reaction   HPI Katie Roberson is a 30 y.o. female who presents for evaluation of hives.  Patient reports that her symptoms started this evening after she used a new seasoning packet for her chicken.  She is complaining of hives in the arms, legs, and trunk.  She is complaining of tingling sensation in her lips.  She denies throat closing sensation, angioedema, difficulty breathing, wheezing, dizziness, syncope, vomiting, diarrhea, chest pain.  Patient reports having allergic reaction to food in the past.  Never had anaphylaxis before.   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   Candidal vaginitis 07/07/2019   Calculus of gallbladder with acute cholecystitis without obstruction    Cholecystitis 02/27/2018   Acute cholecystitis    Abnormal uterine bleeding (AUB) 02/08/2016   Screening examination for STD (sexually transmitted disease) 02/08/2016   Abnormal vaginal bleeding 01/29/2013   PCOS (polycystic ovarian syndrome) 12/26/2012   Morbid obesity (HCC) 07/16/2012    Past Surgical History:  Procedure Laterality Date   CHOLECYSTECTOMY N/A 02/28/2018   Procedure: LAPAROSCOPIC CHOLECYSTECTOMY;  Surgeon: Franky Macho, MD;  Location: [facility];  Service: General;  Laterality: N/A;    Prior to Admission medications   Medication Sig Start Date End Date Taking? Authorizing Provider  fluconazole (DIFLUCAN) 150 MG tablet Take by mouth. Patient  not taking: No sig reported 04/22/20   [provider]    Allergies Grapefruit concentrate, Grapefruit extract, and Kiwi extract  Family History  Problem Relation Age of Onset   Diabetes Paternal Grandmother     Social History Social History   Tobacco Use   Smoking status: Former    Types: Cigarettes   Smokeless tobacco: Never  Vaping Use   Vaping Use: Never used  Substance Use Topics   Alcohol use: Yes    Comment: socially    Drug use: No    Review of Systems  Constitutional: Negative for fever. Eyes: Negative for visual changes. ENT: Negative for sore throat. Neck: No neck pain  Cardiovascular: Negative for chest pain. Respiratory: Negative for shortness of breath. Gastrointestinal: Negative for abdominal pain, vomiting or diarrhea. Genitourinary: Negative for dysuria. Musculoskeletal: Negative for back pain. Skin: + Hives Neurological: Negative for headaches, weakness or numbness. Psych: No SI or HI  ____________________________________________   PHYSICAL EXAM:  VITAL SIGNS: ED Triage Vitals  Enc Vitals Group     BP 11/12/20 0140 121/88     Pulse Rate 11/12/20 0055 91     Resp 11/12/20 0055 16     Temp 11/12/20 0055 98.7 F (37.1 C)     Temp Source 11/12/20 0055 Oral     SpO2 11/12/20 0055 98 %     Weight 11/12/20 0055 (!) 320 lb (145.2 kg)     Height 11/12/20 0055 5\' 8"  (1.727 m)     Head Circumference --      Peak Flow --      Pain  Score 11/12/20 0055 0     Pain Loc --      Pain Edu? --      Excl. in GC? --     Constitutional: Alert and oriented. Well appearing and in no apparent distress. HEENT:      Head: Normocephalic and atraumatic.         Eyes: Conjunctivae are normal. Sclera is non-icteric.       Mouth/Throat: Mucous membranes are moist.  No angioedema, airway is patent, tongue and uvula are normal.      Neck: Supple with no signs of meningismus. Cardiovascular: Regular rate and rhythm.  Respiratory: Normal respiratory  effort. Lungs are clear to auscultation bilaterally.  No stridor, no wheezing Gastrointestinal: Soft, non tender. Musculoskeletal:  No edema, cyanosis, or erythema of extremities. Neurologic: Normal speech and language. Face is symmetric. Moving all extremities. No gross focal neurologic deficits are appreciated. Skin: Skin is warm, dry and intact.  Hives noted in the torso and extremities Psychiatric: Mood and affect are normal. Speech and behavior are normal.  ____________________________________________   LABS (all labs ordered are listed, but only abnormal results are displayed)  Labs Reviewed - No data to display ____________________________________________  EKG  none  ____________________________________________  RADIOLOGY  none  ____________________________________________   PROCEDURES  Procedure(s) performed: None Procedures   Critical Care performed:  None ____________________________________________   INITIAL IMPRESSION / ASSESSMENT AND PLAN / ED COURSE  30 y.o. female who presents for evaluation of hives.  Patient with diffuse hives after possible food exposure.  No signs of anaphylaxis.  Patient received IV Solu-Medrol, Pepcid and Benadryl for resolution of her symptoms.  Monitor for 2.5 hours with no recurrence of her symptoms.  Will discharge home with supportive care follow-up with PCP.  Discussed my standard return precautions.  Prescription for EpiPen given.  Discuss instructions of when to use it      _____________________________________________ Please note:  Patient was evaluated in Emergency Department today for the symptoms described in the history of present illness. Patient was evaluated in the context of the global COVID-19 pandemic, which necessitated consideration that the patient might be at risk for infection with the SARS-CoV-2 virus that causes COVID-19. Institutional protocols and algorithms that pertain to the evaluation of patients at risk  for COVID-19 are in a state of rapid change based on information released by regulatory bodies including the CDC and federal and state organizations. These policies and algorithms were followed during the patient's care in the ED.  Some ED evaluations and interventions may be delayed as a result of limited staffing during the pandemic.   [place] Controlled Substance Database was reviewed by me. ____________________________________________   FINAL CLINICAL IMPRESSION(S) / ED DIAGNOSES   Final diagnoses:  Hives      NEW MEDICATIONS STARTED DURING THIS VISIT:  ED Discharge Orders     None        Note:  This document was prepared using Dragon voice recognition software and may include unintentional dictation errors.    Don Perking, [place], MD 11/12/20 2703    Nita Sickle, MD 11/12/20 [phone]

## 2020-11-12 NOTE — ED Triage Notes (Signed)
Pt states hives that began this am. Pt with hives noted to arms, back, legs. Pt states she feels like her lips "are funny". Pt states did not take any benadryl, states "it doesn't work for me". Pt without resp distress noted.

## 2020-11-21 ENCOUNTER — Other Ambulatory Visit: Payer: Medicaid Other

## 2021-01-04 ENCOUNTER — Other Ambulatory Visit: Payer: Self-pay

## 2021-01-04 ENCOUNTER — Emergency Department
Admission: EM | Admit: 2021-01-04 | Discharge: 2021-01-04 | Disposition: A | Discharge status: Home / Self Care | Payer: Medicaid Other | Attending: Emergency Medicine | Admitting: Emergency Medicine

## 2021-01-04 DIAGNOSIS — R1030 Lower abdominal pain, unspecified: Secondary | ICD-10-CM

## 2021-01-04 DIAGNOSIS — Z87891 Personal history of nicotine dependence: Secondary | ICD-10-CM | POA: Diagnosis not present

## 2021-01-04 DIAGNOSIS — R1031 Right lower quadrant pain: Secondary | ICD-10-CM | POA: Insufficient documentation

## 2021-01-04 DIAGNOSIS — Z3201 Encounter for pregnancy test, result positive: Secondary | ICD-10-CM

## 2021-01-04 DIAGNOSIS — Z3A00 Weeks of gestation of pregnancy not specified: Secondary | ICD-10-CM | POA: Diagnosis not present

## 2021-01-04 DIAGNOSIS — O26891 Other specified pregnancy related conditions, first trimester: Secondary | ICD-10-CM | POA: Diagnosis present

## 2021-01-04 LAB — URINALYSIS, ROUTINE W REFLEX MICROSCOPIC
Bilirubin Urine: NEGATIVE
Glucose, UA: NEGATIVE mg/dL
Hgb urine dipstick: NEGATIVE
Ketones, ur: NEGATIVE mg/dL
Leukocytes,Ua: NEGATIVE
Nitrite: NEGATIVE
Protein, ur: NEGATIVE mg/dL
Specific Gravity, Urine: 1.026 (ref 1.005–1.030)
Squamous Epithelial / HPF: NONE SEEN (ref 0–5)
WBC, UA: NONE SEEN WBC/hpf (ref 0–5)
pH: 5 (ref 5.0–8.0)

## 2021-01-04 LAB — COMPREHENSIVE METABOLIC PANEL
ALT: 10 U/L (ref 0–44)
AST: 19 U/L (ref 15–41)
Albumin: 3.2 g/dL — ABNORMAL LOW (ref 3.5–5.0)
Alkaline Phosphatase: 63 U/L (ref 38–126)
Anion gap: 4 — ABNORMAL LOW (ref 5–15)
BUN: 11 mg/dL (ref 6–20)
CO2: 22 mmol/L (ref 22–32)
Calcium: 8.4 mg/dL — ABNORMAL LOW (ref 8.9–10.3)
Chloride: 109 mmol/L (ref 98–111)
Creatinine, Ser: 0.81 mg/dL (ref 0.44–1.00)
GFR, Estimated: >60 mL/min (ref >60)
Glucose, Bld: 98 mg/dL (ref 70–99)
Potassium: 3.4 mmol/L — ABNORMAL LOW (ref 3.5–5.1)
Sodium: 135 mmol/L (ref 135–145)
Total Bilirubin: 0.7 mg/dL (ref 0.3–1.2)
Total Protein: 6.5 g/dL (ref 6.5–8.1)

## 2021-01-04 LAB — CBC WITH DIFFERENTIAL/PLATELET
Abs Immature Granulocytes: 0.05 10*3/uL (ref 0.00–0.07)
Basophils Absolute: 0.1 10*3/uL (ref 0.0–0.1)
Basophils Relative: 1 %
Eosinophils Absolute: 0.1 10*3/uL (ref 0.0–0.5)
Eosinophils Relative: 1 %
HCT: 35.5 % — ABNORMAL LOW (ref 36.0–46.0)
Hemoglobin: 12 g/dL (ref 12.0–15.0)
Immature Granulocytes: 0 %
Lymphocytes Relative: 16 %
Lymphs Abs: 2 10*3/uL (ref 0.7–4.0)
MCH: 30 pg (ref 26.0–34.0)
MCHC: 33.8 g/dL (ref 30.0–36.0)
MCV: 88.8 fL (ref 80.0–100.0)
Monocytes Absolute: 0.9 10*3/uL (ref 0.1–1.0)
Monocytes Relative: 7 %
Neutro Abs: 9.8 10*3/uL — ABNORMAL HIGH (ref 1.7–7.7)
Neutrophils Relative %: 75 %
Platelets: 316 10*3/uL (ref 150–400)
RBC: 4 MIL/uL (ref 3.87–5.11)
RDW: 13.1 % (ref 11.5–15.5)
WBC: 13 10*3/uL — ABNORMAL HIGH (ref 4.0–10.5)
nRBC: 0 % (ref 0.0–0.2)

## 2021-01-04 LAB — PREGNANCY, URINE: Preg Test, Ur: POSITIVE — AB

## 2021-01-04 NOTE — ED Triage Notes (Signed)
Pt comes with c/o abdominal pain and cramping for about a week. Pt states neg preg test at home. Pt has hx of PCOS. Pt states some nausea. Pt states tenderness to breast and nipples.

## 2021-01-04 NOTE — ED Provider Notes (Signed)
Emergency Medicine Provider Triage Evaluation Note  Katie Roberson , a 30 y.o. female  was evaluated in triage.  Pt complains of breast tenderness, nausea, abdominal pain.  Review of Systems  Positive:  breast pain, abd pain, missed lmp Negative: Fever, chills, vaginal discharge  Physical Exam  BP 122/81   Pulse 82   Temp 98 F (36.7 C)   Resp 18   SpO2 96%  Gen:   Awake, no distress   Resp:  Normal effort  MSK:   Moves extremities without difficulty  Other:    Medical Decision Making  Medically screening exam initiated at 12:09 PM.  Appropriate orders placed.  Katie Roberson was informed that the remainder of the evaluation will be completed by another provider, this initial triage assessment does not replace that evaluation, and the importance of remaining in the ED until their evaluation is complete.     Faythe Ghee, PA-C 01/04/21 1210    Merwyn Katos, MD 01/04/21 [phone]

## 2021-01-04 NOTE — Discharge Instructions (Signed)
Please begin taking prenatal vitamins

## 2021-01-04 NOTE — ED Notes (Signed)
POC performed

## 2021-01-04 NOTE — ED Provider Notes (Signed)
[facility] Emergency Department Provider Note   ____________________________________________   Event Date/Time   First MD Initiated Contact with Patient 01/04/21 1416     (approximate)  I have reviewed the triage vital signs and the nursing notes.   HISTORY  Chief Complaint Abdominal Pain    HPI Katie Roberson is a 30 y.o. female with a stated past medical history of PCOS who presents for lower abdominal cramping  LOCATION: Lower abdomen DURATION: 1 week prior to arrival TIMING: Intermittent with episodes that last approximately 3-4 minutes SEVERITY: 7/10 QUALITY: Cramping CONTEXT: Patient states that over the past week she has had intermittent episodes that last approximately 3-4 minutes of cramping abdominal pain that radiates from the left to right lower quadrants of her abdomen is associated with constipation MODIFYING FACTORS: Denies any exacerbating or relieving factors ASSOCIATED SYMPTOMS: Constipation and mild nausea   Per medical record review, patient has history of chronic pelvic pain with PCOS          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   Candidal vaginitis 07/07/2019   Calculus of gallbladder with acute cholecystitis without obstruction    Cholecystitis 02/27/2018   Acute cholecystitis    Abnormal uterine bleeding (AUB) 02/08/2016   Screening examination for STD (sexually transmitted disease) 02/08/2016   Abnormal vaginal bleeding 01/29/2013   PCOS (polycystic ovarian syndrome) 12/26/2012   Morbid obesity (HCC) 07/16/2012    Past Surgical History:  Procedure Laterality Date   CHOLECYSTECTOMY N/A 02/28/2018   Procedure:  LAPAROSCOPIC CHOLECYSTECTOMY;  Surgeon: Franky Macho, MD;  Location: [facility];  Service: General;  Laterality: N/A;    Prior to Admission medications   Medication Sig Start Date End Date Taking? Authorizing Provider  EPINEPHrine 0.3 mg/0.3 mL IJ SOAJ injection Inject 0.3 mg into the muscle as needed for anaphylaxis. 11/12/20   Nita Sickle, MD  fluconazole (DIFLUCAN) 150 MG tablet Take by mouth. Patient not taking: No sig reported 04/22/20   [provider]    Allergies Grapefruit concentrate, Grapefruit extract, and Kiwi extract  Family History  Problem Relation Age of Onset   Diabetes Paternal Grandmother     Social History Social History   Tobacco Use   Smoking status: Former    Types: Cigarettes   Smokeless tobacco: Never  Vaping Use   Vaping Use: Never used  Substance Use Topics   Alcohol use: Yes    Comment: socially    Drug use: No    Review of Systems Constitutional: No fever/chills Eyes: No visual changes. ENT: No sore throat. Cardiovascular: Denies chest pain. Respiratory: Denies shortness of breath. Gastrointestinal: Endorses lower abdominal pain.  Endorses nausea, no vomiting.  No diarrhea. Genitourinary: Negative for dysuria. Musculoskeletal: Negative for acute arthralgias Skin: Negative for rash. Neurological: Negative for headaches, weakness/numbness/paresthesias in any extremity Psychiatric: Negative for suicidal ideation/homicidal ideation   ____________________________________________   PHYSICAL EXAM:  VITAL SIGNS: ED Triage Vitals  Enc Vitals Group     BP 01/04/21 1206 122/81     Pulse Rate 01/04/21 1206 82     Resp 01/04/21 1206 18  Temp 01/04/21 1206 98 F (36.7 C)     Temp src --      SpO2 01/04/21 1206 96 %     Weight --      Height --      Head Circumference --      Peak Flow --      Pain Score 01/04/21 1208 7     Pain Loc --      Pain Edu? --      Excl. in [place]? --    Constitutional: Alert and oriented. Well  appearing obese African-American in no acute distress. Eyes: Conjunctivae are normal. PERRL. Head: Atraumatic. Nose: No congestion/rhinnorhea. Mouth/Throat: Mucous membranes are moist. Neck: No stridor Cardiovascular: Grossly normal heart sounds.  Good peripheral circulation. Respiratory: Normal respiratory effort.  No retractions. Gastrointestinal: Soft and nontender. No distention. Musculoskeletal: No obvious deformities Neurologic:  Normal speech and language. No gross focal neurologic deficits are appreciated. Skin:  Skin is warm and dry. No rash noted. Psychiatric: Mood and affect are normal. Speech and behavior are normal.  ____________________________________________   LABS (all labs ordered are listed, but only abnormal results are displayed)  Labs Reviewed  COMPREHENSIVE METABOLIC PANEL - Abnormal; Notable for the following components:      Result Value   Potassium 3.4 (*)    Calcium 8.4 (*)    Albumin 3.2 (*)    Anion gap 4 (*)    All other components within normal limits  CBC WITH DIFFERENTIAL/PLATELET - Abnormal; Notable for the following components:   WBC 13.0 (*)    HCT 35.5 (*)    Neutro Abs 9.8 (*)    All other components within normal limits  URINALYSIS, ROUTINE W REFLEX MICROSCOPIC - Abnormal; Notable for the following components:   Color, Urine YELLOW (*)    APPearance TURBID (*)    Bacteria, UA FEW (*)    All other components within normal limits  PREGNANCY, URINE - Abnormal; Notable for the following components:   Preg Test, Ur POSITIVE (*)    All other components within normal limits  POC URINE PREG, ED     PROCEDURES  Procedure(s) performed (including Critical Care):  Procedures   ____________________________________________   INITIAL IMPRESSION / ASSESSMENT AND PLAN / ED COURSE  As part of my medical decision making, I reviewed the following data within the electronic medical record, if available:  Nursing notes reviewed and  incorporated, Labs reviewed, EKG interpreted, Old chart reviewed, Radiograph reviewed and Notes from prior ED visits reviewed and incorporated        Patient is a 30 year old female who presents for lower abdominal cramping over the last week  DDx: Diverticulitis, appendicitis, perforated hollow viscus, urosepsis, UTI, pregnancy  Work-up: Leukocytosis to 13 Urine pregnancy test positive  Patient was unaware that she could be pregnant given her history of PCOS and she was excited for the news.  Patient encouraged to start prenatal vitamins as well as given multiple clinics of OB/GYN for follow-up.  Patient expressed understanding and all questions answered prior to discharge.  Dispo: Discharge home with OB/GYN follow-up      ____________________________________________   FINAL CLINICAL IMPRESSION(S) / ED DIAGNOSES  Final diagnoses:  Lower abdominal pain  Positive urine pregnancy test     ED Discharge Orders     None        Note:  This document was prepared using Dragon voice recognition software and may include unintentional dictation errors.    Merwyn Katos, MD 01/04/21  1807  

## 2021-01-07 ENCOUNTER — Other Ambulatory Visit: Payer: Self-pay

## 2021-01-07 ENCOUNTER — Emergency Department: Payer: Medicaid Other

## 2021-01-07 ENCOUNTER — Emergency Department
Admission: EM | Admit: 2021-01-07 | Discharge: 2021-01-08 | Disposition: A | Discharge status: Home / Self Care | Payer: Medicaid Other | Attending: Emergency Medicine | Admitting: Emergency Medicine

## 2021-01-07 DIAGNOSIS — Z87891 Personal history of nicotine dependence: Secondary | ICD-10-CM | POA: Insufficient documentation

## 2021-01-07 DIAGNOSIS — R102 Pelvic and perineal pain: Secondary | ICD-10-CM | POA: Insufficient documentation

## 2021-01-07 DIAGNOSIS — Z3490 Encounter for supervision of normal pregnancy, unspecified, unspecified trimester: Secondary | ICD-10-CM

## 2021-01-07 DIAGNOSIS — O26891 Other specified pregnancy related conditions, first trimester: Secondary | ICD-10-CM | POA: Insufficient documentation

## 2021-01-07 DIAGNOSIS — Z3A01 Less than 8 weeks gestation of pregnancy: Secondary | ICD-10-CM | POA: Diagnosis not present

## 2021-01-07 LAB — URINALYSIS, ROUTINE W REFLEX MICROSCOPIC
Bilirubin Urine: NEGATIVE
Glucose, UA: NEGATIVE mg/dL
Hgb urine dipstick: NEGATIVE
Ketones, ur: NEGATIVE mg/dL
Leukocytes,Ua: NEGATIVE
Nitrite: NEGATIVE
Protein, ur: NEGATIVE mg/dL
Specific Gravity, Urine: 1.019 (ref 1.005–1.030)
pH: 5 (ref 5.0–8.0)

## 2021-01-07 LAB — CBC WITH DIFFERENTIAL/PLATELET
Abs Immature Granulocytes: 0.04 10*3/uL (ref 0.00–0.07)
Basophils Absolute: 0.1 10*3/uL (ref 0.0–0.1)
Basophils Relative: 0 %
Eosinophils Absolute: 0.1 10*3/uL (ref 0.0–0.5)
Eosinophils Relative: 1 %
HCT: 35.4 % — ABNORMAL LOW (ref 36.0–46.0)
Hemoglobin: 12.2 g/dL (ref 12.0–15.0)
Immature Granulocytes: 0 %
Lymphocytes Relative: 17 %
Lymphs Abs: 2.3 10*3/uL (ref 0.7–4.0)
MCH: 30.7 pg (ref 26.0–34.0)
MCHC: 34.5 g/dL (ref 30.0–36.0)
MCV: 88.9 fL (ref 80.0–100.0)
Monocytes Absolute: 1.1 10*3/uL — ABNORMAL HIGH (ref 0.1–1.0)
Monocytes Relative: 8 %
Neutro Abs: 10 10*3/uL — ABNORMAL HIGH (ref 1.7–7.7)
Neutrophils Relative %: 74 %
Platelets: 283 10*3/uL (ref 150–400)
RBC: 3.98 MIL/uL (ref 3.87–5.11)
RDW: 12.8 % (ref 11.5–15.5)
WBC: 13.5 10*3/uL — ABNORMAL HIGH (ref 4.0–10.5)
nRBC: 0 % (ref 0.0–0.2)

## 2021-01-07 LAB — LIPASE, BLOOD: Lipase: 57 U/L — ABNORMAL HIGH (ref 11–51)

## 2021-01-07 LAB — BASIC METABOLIC PANEL
Anion gap: 4 — ABNORMAL LOW (ref 5–15)
BUN: 14 mg/dL (ref 6–20)
CO2: 22 mmol/L (ref 22–32)
Calcium: 8.6 mg/dL — ABNORMAL LOW (ref 8.9–10.3)
Chloride: 107 mmol/L (ref 98–111)
Creatinine, Ser: 0.69 mg/dL (ref 0.44–1.00)
GFR, Estimated: >60 mL/min (ref >60)
Glucose, Bld: 85 mg/dL (ref 70–99)
Potassium: 3.8 mmol/L (ref 3.5–5.1)
Sodium: 133 mmol/L — ABNORMAL LOW (ref 135–145)

## 2021-01-07 LAB — HCG, QUANTITATIVE, PREGNANCY: hCG, Beta Chain, Quant, S: 31084 m[IU]/mL — ABNORMAL HIGH (ref <5)

## 2021-01-07 NOTE — ED Triage Notes (Signed)
Pt to ED for abd cramping since 11/18. States found out pregnant at ER on 11/16. Denies vaginal bleeding.  Unsure how far along, thinks LMP 2 months ago

## 2021-01-07 NOTE — ED Provider Notes (Signed)
Emergency Medicine Provider Triage Evaluation Note  Katie Roberson, a 30 y.o. female  was evaluated in triage.  Pt complains of complains of generalized abdominal cramping for the last 24 to 48 hours.  Patient found out on the 1116 that she was pregnant.  She has PCOS and irregular menses,, and is unclear how far along she may be.  Denies any pelvic pain, vaginal bleeding, or discharge.  Review of Systems  Positive: Generalized ABD pain, nausea Negative: FCS  Physical Exam  BP 97/77 (BP Location: Left Arm)   Pulse 72   Temp 98.4 F (36.9 C) (Oral)   Resp 20   Ht 5\' 8"  (1.727 m)   Wt (!) 138.3 kg   LMP 11/21/2020 (Approximate)   SpO2 100%   BMI 46.38 kg/m  Gen:   Awake, no distress  NAD Resp:  Normal effort CTA MSK:   Moves extremities without difficulty  Other:  ABD: soft, nontender  Medical Decision Making  Medically screening exam initiated at 7:08 PM.  Appropriate orders placed.  01/21/2021 was informed that the remainder of the evaluation will be completed by another provider, this initial triage assessment does not replace that evaluation, and the importance of remaining in the ED until their evaluation is complete.  Female patient with a recently confirmed urine pregnancy test, presenting to the ED for the second visit in 3 days with complaints of generalized abdominal pain.   Katie Kidney, PA-C 01/07/21 1910    01/09/21, MD 01/07/21 1921

## 2021-01-08 NOTE — ED Provider Notes (Signed)
[facility] Emergency Department Provider Note  ____________________________________________   Event Date/Time   First MD Initiated Contact with Patient 01/07/21 2308     (approximate)  I have reviewed the triage vital signs and the nursing notes.   HISTORY  Chief Complaint Abdominal Cramping    HPI Katie Roberson is a 30 y.o. female with recent diagnosis of pregnancy who presents for generalized abdominal cramps.  Has been going on for a few days.  No nausea nor vomiting.  No vaginal bleeding.  She has not yet established prenatal care.  She is concerned about the cramping and would like to know if everything is okay with her pregnancy.  History of PCOS and chronic vaginal pain.  No concern for STD.  No recent dysuria.  Cramping is mild to moderate and nothing in particular makes it better or worse.     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   Candidal vaginitis 07/07/2019   Calculus of gallbladder with acute cholecystitis without obstruction    Cholecystitis 02/27/2018   Acute cholecystitis    Abnormal uterine bleeding (AUB) 02/08/2016   Screening examination for STD (sexually transmitted disease) 02/08/2016   Abnormal vaginal bleeding 01/29/2013   PCOS (polycystic ovarian syndrome) 12/26/2012   Morbid obesity (HCC) 07/16/2012    Past Surgical History:  Procedure Laterality Date   CHOLECYSTECTOMY N/A 02/28/2018   Procedure: LAPAROSCOPIC CHOLECYSTECTOMY;  Surgeon: Franky Macho, MD;  Location: [facility];  Service: General;  Laterality: N/A;    Prior to Admission medications   Medication Sig Start Date End Date Taking? Authorizing Provider  EPINEPHrine  0.3 mg/0.3 mL IJ SOAJ injection Inject 0.3 mg into the muscle as needed for anaphylaxis. 11/12/20   Nita Sickle, MD  fluconazole (DIFLUCAN) 150 MG tablet Take by mouth. Patient not taking: No sig reported 04/22/20   [provider]    Allergies Grapefruit concentrate, Grapefruit extract, and Kiwi extract  Family History  Problem Relation Age of Onset   Diabetes Paternal Grandmother     Social History Social History   Tobacco Use   Smoking status: Former    Types: Cigarettes   Smokeless tobacco: Never  Vaping Use   Vaping Use: Never used  Substance Use Topics   Alcohol use: Yes    Comment: socially    Drug use: No    Review of Systems Constitutional: No fever/chills Eyes: No visual changes. ENT: No sore throat. Cardiovascular: Denies chest pain. Respiratory: Denies shortness of breath. Gastrointestinal: Positive for generalized abdominal cramping.  Negative for nausea and vomiting. Genitourinary: Negative for dysuria.  Negative for vaginal bleeding. Musculoskeletal: Negative for neck pain.  Negative for back pain. Integumentary: Negative for rash. Neurological: Negative for headaches, focal weakness or numbness.   ____________________________________________   PHYSICAL EXAM:  VITAL SIGNS: ED Triage Vitals  Enc Vitals Group     BP 01/07/21 1831 97/77     Pulse Rate 01/07/21 1831 72     Resp 01/07/21 1831 20     Temp 01/07/21 1831 98.4 F (36.9 C)     Temp Source 01/07/21 1831 Oral  SpO2 01/07/21 1831 100 %     Weight 01/07/21 1831 (!) 138.3 kg (305 lb)     Height 01/07/21 1831 1.727 m (5\' 8" )     Head Circumference --      Peak Flow --      Pain Score 01/07/21 1834 9     Pain Loc --      Pain Edu? --      Excl. in [place]? --     Constitutional: Alert and oriented.  Eyes: Conjunctivae are normal.  Head: Atraumatic. Nose: No congestion/rhinnorhea. Mouth/Throat: Patient is wearing a mask. Neck: No stridor.  No meningeal signs.    Cardiovascular: Normal rate, regular rhythm. Good peripheral circulation. Respiratory: Normal respiratory effort.  No retractions. Gastrointestinal: Obese.  Soft and nontender. No distention.  Genitourinary: Deferred Musculoskeletal: No lower extremity tenderness nor edema. No gross deformities of extremities. Neurologic:  Normal speech and language. No gross focal neurologic deficits are appreciated.  Skin:  Skin is warm, dry and intact. Psychiatric: Mood and affect are normal. Speech and behavior are normal.  ____________________________________________   LABS (all labs ordered are listed, but only abnormal results are displayed)  Labs Reviewed  BASIC METABOLIC PANEL - Abnormal; Notable for the following components:      Result Value   Sodium 133 (*)    Calcium 8.6 (*)    Anion gap 4 (*)    All other components within normal limits  URINALYSIS, ROUTINE W REFLEX MICROSCOPIC - Abnormal; Notable for the following components:   Color, Urine YELLOW (*)    APPearance HAZY (*)    Bacteria, UA RARE (*)    All other components within normal limits  CBC WITH DIFFERENTIAL/PLATELET - Abnormal; Notable for the following components:   WBC 13.5 (*)    HCT 35.4 (*)    Neutro Abs 10.0 (*)    Monocytes Absolute 1.1 (*)    All other components within normal limits  HCG, QUANTITATIVE, PREGNANCY - Abnormal; Notable for the following components:   hCG, Beta Chain, Quant, S 31,084 (*)    All other components within normal limits  LIPASE, BLOOD - Abnormal; Notable for the following components:   Lipase 57 (*)    All other components within normal limits   ____________________________________________   RADIOLOGY I, Hinda Kehr, personally viewed and evaluated these images (plain radiographs) as part of my medical decision making, as well as reviewing the written report by the radiologist.  ED MD interpretation: Single live intrauterine pregnancy at 6 weeks and 4 days gestation with trace  pelvic free fluid.  Official radiology report(s): US OB LESS THAN 14 WEEKS WITH OB TRANSVAGINAL  Result Date: 01/07/2021 CLINICAL DATA:  Abdominal cramping, beta HCG 31,084 EXAM: OBSTETRIC <14 WK [place] AND TRANSVAGINAL OB US TECHNIQUE: Both transabdominal and transvaginal ultrasound examinations were performed for complete evaluation of the gestation as well as the maternal uterus, adnexal regions, and pelvic cul-de-sac. Transvaginal technique was performed to assess early pregnancy. COMPARISON:  None. FINDINGS: Intrauterine gestational sac: Single Yolk sac:  Visualized. Embryo:  Visualized. Cardiac Activity: Visualized. Heart Rate: 122 bpm CRL:  7.3 mm   6 w   4 d                  [place] EDC: 08/29/2021 Subchorionic hemorrhage:  None visualized. Maternal uterus/adnexae: Uterus measures 8.7 x 4.4 x 5.4 cm. No uterine masses. Cervix is closed, measuring 3.4 cm in length. Right ovary measures 3.4 x 2.4 x 3.3 cm and the  left ovary measures 3.1 x 1.9 x 3.2 cm. Trace pelvic free fluid adjacent to the left ovary. IMPRESSION: 1. Single live intrauterine pregnancy as above estimated age 62 weeks and 4 days. 2. Trace pelvic free fluid, likely physiologic. Electronically Signed   By: Randa Ngo M.D.   On: 01/07/2021 23:41    ____________________________________________   PROCEDURES   Procedure(s) performed (including Critical Care):  Procedures   ____________________________________________   INITIAL IMPRESSION / MDM / [place] / ED COURSE  As part of my medical decision making, I reviewed the following data within the [place] notes reviewed and incorporated, Labs reviewed \ Old chart reviewed, and Notes from prior ED visits   Differential diagnosis includes, but is not limited to, normal discomfort of pregnancy, acute intra-abdominal infection, UTI, STD.  Patient with no abdominal pain or tenderness at this time.  Has not yet established prenatal care or had an  ultrasound.  Mild leukocytosis of 13.5 which is nonspecific, otherwise normal CBC.  Appropriately elevated hCG.  Lipase is slightly elevated but she has no tenderness to palpation of the upper abdomen and no nausea nor vomiting.  Basic metabolic panel is normal.  Urinalysis is unremarkable with no evidence of infection; although there was rare bacteria, there are also squamous cells, and is nitrite negative with no clinically significant RBCs nor WBCs.  Ultrasound shows a single live fetus at 6 weeks and 4 days gestation with trace pelvic free fluid which may be explaining her discomfort.  I provided her with the update and she is reassured.  She will follow-up with the [facility] for OB/GYN care.  I gave my usual and customary return precautions.           ____________________________________________  FINAL CLINICAL IMPRESSION(S) / ED DIAGNOSES  Final diagnoses:  Pelvic pain  Early stage of pregnancy     MEDICATIONS GIVEN DURING THIS VISIT:  Medications - No data to display   ED Discharge Orders     None        Note:  This document was prepared using Dragon voice recognition software and may include unintentional dictation errors.   Hinda Kehr, MD 01/08/21 0200

## 2021-01-08 NOTE — Discharge Instructions (Addendum)
Your evaluation was reassuring today.  You are pregnant with a single baby at about 6 weeks and 4 days gestation.  Please follow-up either with the [facility] department or with one of the local OB/GYN clinics to establish prenatal care.  In the meantime you should start taking over-the-counter prenatal vitamins and be sure to avoid tobacco and alcohol.  Read through the included information for some additional recommendations.    Return to the emergency department if you develop new or worsening symptoms that concern you.

## 2021-01-10 ENCOUNTER — Telehealth: Payer: Self-pay

## 2021-01-10 NOTE — Telephone Encounter (Signed)
Pt called triage to report breast/nipple tenderness, she was worried it was a sign of a miscarriage. Pt aware that this is normal in pregnancy

## 2021-01-10 NOTE — Telephone Encounter (Signed)
Pt calling; has another pregnancy question.  [phone] Pt wanted to know if okay to lay on stomach.  Adv it's okay for now but as she gets bigger it will not be comfortable; we don't want her to lie flat on her back b/c there are two main arteries that run underneath the uterus and the weight will cut off circulation from her legs and to the baby so place a pillow underneath one hip so she will be tilted.

## 2021-01-18 ENCOUNTER — Encounter: Payer: Medicaid Other | Admitting: Licensed Practical Nurse

## 2021-01-27 ENCOUNTER — Encounter (HOSPITAL_COMMUNITY): Payer: Self-pay | Admitting: *Deleted

## 2021-01-27 ENCOUNTER — Other Ambulatory Visit: Payer: Self-pay

## 2021-01-27 DIAGNOSIS — R109 Unspecified abdominal pain: Secondary | ICD-10-CM | POA: Diagnosis not present

## 2021-01-27 DIAGNOSIS — Z87891 Personal history of nicotine dependence: Secondary | ICD-10-CM | POA: Diagnosis not present

## 2021-01-27 DIAGNOSIS — O26891 Other specified pregnancy related conditions, first trimester: Secondary | ICD-10-CM | POA: Diagnosis not present

## 2021-01-27 DIAGNOSIS — R102 Pelvic and perineal pain: Secondary | ICD-10-CM | POA: Insufficient documentation

## 2021-01-27 NOTE — ED Triage Notes (Signed)
Pt c/o abdominal pain; pt states she "wants to be checked for everything, herpes, syphillis"  pt denies any n/v/d

## 2021-01-28 ENCOUNTER — Emergency Department (HOSPITAL_COMMUNITY)
Admission: EM | Admit: 2021-01-28 | Discharge: 2021-01-28 | Disposition: A | Discharge status: Home / Self Care | Payer: Medicaid Other | Attending: Emergency Medicine | Admitting: Emergency Medicine

## 2021-01-28 DIAGNOSIS — O26891 Other specified pregnancy related conditions, first trimester: Secondary | ICD-10-CM

## 2021-01-28 LAB — WET PREP, GENITAL
Sperm: NONE SEEN
Trich, Wet Prep: NONE SEEN
Yeast Wet Prep HPF POC: NONE SEEN

## 2021-01-28 LAB — CBC
HCT: 33.7 % — ABNORMAL LOW (ref 36.0–46.0)
Hemoglobin: 11.5 g/dL — ABNORMAL LOW (ref 12.0–15.0)
MCH: 31.1 pg (ref 26.0–34.0)
MCHC: 34.1 g/dL (ref 30.0–36.0)
MCV: 91.1 fL (ref 80.0–100.0)
Platelets: 256 10*3/uL (ref 150–400)
RBC: 3.7 MIL/uL — ABNORMAL LOW (ref 3.87–5.11)
RDW: 12.9 % (ref 11.5–15.5)
WBC: 14 10*3/uL — ABNORMAL HIGH (ref 4.0–10.5)
nRBC: 0 % (ref 0.0–0.2)

## 2021-01-28 LAB — HIV ANTIBODY (ROUTINE TESTING W REFLEX): HIV Screen 4th Generation wRfx: NONREACTIVE

## 2021-01-28 LAB — HCG, QUANTITATIVE, PREGNANCY: hCG, Beta Chain, Quant, S: 66470 m[IU]/mL — ABNORMAL HIGH (ref <5)

## 2021-01-28 LAB — ABO/RH: ABO/RH(D): B POS

## 2021-01-28 LAB — RPR: RPR Ser Ql: NONREACTIVE

## 2021-01-28 NOTE — Discharge Instructions (Signed)
At this time it is not clear what is causing your intermittent cramping.  There does not appear to be anything dangerous occurring.  Please follow-up for your visit this week as is scheduled.  Return to the ER for worsening pain or vaginal bleeding.

## 2021-01-28 NOTE — ED Provider Notes (Signed)
[facility] EMERGENCY DEPARTMENT Provider Note   CSN: [id number] Arrival date & time: 01/27/21  2238     History Chief Complaint  Patient presents with   Abdominal Pain    Katie Roberson is a 30 y.o. female.  Patient presents to the emergency department for evaluation of abdominal and pelvic pain.  Patient reports that she is a little over 3 months pregnant.  She is being cared for at the health department.  She has not had any prenatal blood work or ultrasound yet.  She has been experiencing intermittent pelvic cramping.  This usually last for about 15 minutes and then resolves.  There is no pattern.  She has not had any vaginal bleeding.  Patient wants to be checked for everything.      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   Candidal vaginitis 07/07/2019   Calculus of gallbladder with acute cholecystitis without obstruction    Cholecystitis 02/27/2018   Acute cholecystitis    Abnormal uterine bleeding (AUB) 02/08/2016   Screening examination for STD (sexually transmitted disease) 02/08/2016   Abnormal vaginal bleeding 01/29/2013   PCOS (polycystic ovarian syndrome) 12/26/2012   Morbid obesity (HCC) 07/16/2012    Past Surgical History:  Procedure Laterality Date   CHOLECYSTECTOMY N/A 02/28/2018   Procedure: LAPAROSCOPIC CHOLECYSTECTOMY;  Surgeon: Franky Macho, MD;  Location: [facility];  Service: General;  Laterality: N/A;     OB History     Gravida  2   Para  0   Term  0   Preterm  0   AB  1   Living  0      SAB  0   IAB  1   Ectopic  0   Multiple  0   Live Births              Family History  Problem Relation Age of Onset   Diabetes Paternal  Grandmother     Social History   Tobacco Use   Smoking status: Former    Types: Cigarettes   Smokeless tobacco: Never  Vaping Use   Vaping Use: Never used  Substance Use Topics   Alcohol use: Yes    Comment: socially    Drug use: No    Home Medications Prior to Admission medications   Medication Sig Start Date End Date Taking? Authorizing Provider  EPINEPHrine 0.3 mg/0.3 mL IJ SOAJ injection Inject 0.3 mg into the muscle as needed for anaphylaxis. 11/12/20   Nita Sickle, MD  fluconazole (DIFLUCAN) 150 MG tablet Take by mouth. Patient not taking: No sig reported 04/22/20   [provider]    Allergies    Grapefruit concentrate, Grapefruit extract, and Kiwi extract  Review of Systems   Review of Systems  Genitourinary:  Positive for pelvic pain.  All other systems reviewed and are negative.  Physical Exam Updated Vital Signs BP 140/85   Pulse 85   Temp 98.2 F (36.8 C) (Oral)   Resp 20   Ht 5\' 8"  (1.727 m)   Wt (!) 145.6 kg   LMP 11/21/2020 (Approximate)   SpO2 99%  BMI 48.81 kg/m   Physical Exam Vitals and nursing note reviewed.  Constitutional:      General: She is not in acute distress.    Appearance: Normal appearance. She is well-developed.  HENT:     Head: Normocephalic and atraumatic.     Right Ear: Hearing normal.     Left Ear: Hearing normal.     Nose: Nose normal.  Eyes:     Conjunctiva/sclera: Conjunctivae normal.     Pupils: Pupils are equal, round, and reactive to light.  Cardiovascular:     Rate and Rhythm: Regular rhythm.     Heart sounds: S1 normal and S2 normal. No murmur heard.   No friction rub. No gallop.  Pulmonary:     Effort: Pulmonary effort is normal. No respiratory distress.     Breath sounds: Normal breath sounds.  Chest:     Chest wall: No tenderness.  Abdominal:     General: Bowel sounds are normal.     Palpations: Abdomen is soft.     Tenderness: There is no abdominal tenderness. There is no guarding or  rebound. Negative signs include Murphy's sign and McBurney's sign.     Hernia: No hernia is present.  Genitourinary:    Vagina: Normal.     Cervix: No cervical motion tenderness or discharge.     Adnexa: Right adnexa normal and left adnexa normal.  Musculoskeletal:        General: Normal range of motion.     Cervical back: Normal range of motion and neck supple.  Skin:    General: Skin is warm and dry.     Findings: No rash.  Neurological:     Mental Status: She is alert and oriented to person, place, and time.     GCS: GCS eye subscore is 4. GCS verbal subscore is 5. GCS motor subscore is 6.     Cranial Nerves: No cranial nerve deficit.     Sensory: No sensory deficit.     Coordination: Coordination normal.  Psychiatric:        Speech: Speech normal.        Behavior: Behavior normal.        Thought Content: Thought content normal.    ED Results / Procedures / Treatments   Labs (all labs ordered are listed, but only abnormal results are displayed) Labs Reviewed  WET PREP, GENITAL - Abnormal; Notable for the following components:      Result Value   WBC, Wet Prep HPF POC MODERATE (*)    All other components within normal limits  CBC - Abnormal; Notable for the following components:   WBC 14.0 (*)    RBC 3.70 (*)    Hemoglobin 11.5 (*)    HCT 33.7 (*)    All other components within normal limits  HCG, QUANTITATIVE, PREGNANCY - Abnormal; Notable for the following components:   hCG, Beta Chain, Quant, S 66,470 (*)    All other components within normal limits  HIV ANTIBODY (ROUTINE TESTING W REFLEX)  RPR  ABO/RH  GC/CHLAMYDIA PROBE AMP ([place]) NOT AT [facility]    EKG None  Radiology No results found.  Procedures Procedures   Medications Ordered in ED Medications - No data to display  ED Course  I have reviewed the triage vital signs and the nursing notes.  Pertinent labs & imaging results that were available during my care of the patient were reviewed by me  and considered in my medical decision making (see chart  for details).    MDM Rules/Calculators/A&P                           Patient presents to the emergency department for evaluation of intermittent abdominal pain.  Patient reports that she is somewhere between 9 and [redacted] weeks pregnant.  She does have intermittent episodes of cramping without vaginal discharge, vaginal bleeding.  She reports that the pain usually lasts about 15 minutes before it goes away.  She has not identified any pattern or anything that causes the pain.  Blood work is reassuring.  Examination is unremarkable.  She seems to be very concerned about STDs, no obvious signs of STD at this time.  Blood work and cultures pending.  I was able to pull up records from [facility] where she did have an ultrasound performed on November 19 that showed an IUP.  There is therefore no concern for ectopic pregnancy, does not require repeat imaging.  She has follow-up scheduled at the health [facility] in a couple of days, patient reassured.  Final Clinical Impression(s) / ED Diagnoses Final diagnoses:  Abdominal pain in pregnancy, first trimester    Rx / DC Orders ED Discharge Orders     None        Yahel Fuston, Canary Brim, MD 01/28/21 [phone]

## 2021-01-30 ENCOUNTER — Other Ambulatory Visit: Payer: Self-pay

## 2021-01-30 ENCOUNTER — Emergency Department
Admission: EM | Admit: 2021-01-30 | Discharge: 2021-01-30 | Disposition: A | Discharge status: Home / Self Care | Payer: Medicaid Other | Attending: Emergency Medicine | Admitting: Emergency Medicine

## 2021-01-30 ENCOUNTER — Encounter (HOSPITAL_COMMUNITY): Payer: Self-pay | Admitting: *Deleted

## 2021-01-30 ENCOUNTER — Emergency Department (HOSPITAL_COMMUNITY)
Admission: EM | Admit: 2021-01-30 | Discharge: 2021-01-30 | Disposition: A | Discharge status: Home / Self Care | Payer: Medicaid Other | Attending: Emergency Medicine | Admitting: Emergency Medicine

## 2021-01-30 ENCOUNTER — Encounter: Payer: Self-pay | Admitting: Emergency Medicine

## 2021-01-30 ENCOUNTER — Emergency Department: Payer: Medicaid Other

## 2021-01-30 DIAGNOSIS — O26899 Other specified pregnancy related conditions, unspecified trimester: Secondary | ICD-10-CM | POA: Insufficient documentation

## 2021-01-30 DIAGNOSIS — N9089 Other specified noninflammatory disorders of vulva and perineum: Secondary | ICD-10-CM

## 2021-01-30 DIAGNOSIS — Z5321 Procedure and treatment not carried out due to patient leaving prior to being seen by health care provider: Secondary | ICD-10-CM | POA: Diagnosis not present

## 2021-01-30 DIAGNOSIS — Z3A12 12 weeks gestation of pregnancy: Secondary | ICD-10-CM | POA: Insufficient documentation

## 2021-01-30 DIAGNOSIS — O2391 Unspecified genitourinary tract infection in pregnancy, first trimester: Secondary | ICD-10-CM | POA: Diagnosis not present

## 2021-01-30 DIAGNOSIS — R102 Pelvic and perineal pain: Secondary | ICD-10-CM | POA: Diagnosis not present

## 2021-01-30 DIAGNOSIS — N9489 Other specified conditions associated with female genital organs and menstrual cycle: Secondary | ICD-10-CM | POA: Insufficient documentation

## 2021-01-30 DIAGNOSIS — R309 Painful micturition, unspecified: Secondary | ICD-10-CM | POA: Insufficient documentation

## 2021-01-30 DIAGNOSIS — R0789 Other chest pain: Secondary | ICD-10-CM | POA: Insufficient documentation

## 2021-01-30 DIAGNOSIS — O99511 Diseases of the respiratory system complicating pregnancy, first trimester: Secondary | ICD-10-CM | POA: Diagnosis not present

## 2021-01-30 DIAGNOSIS — N7689 Other specified inflammation of vagina and vulva: Secondary | ICD-10-CM | POA: Diagnosis not present

## 2021-01-30 DIAGNOSIS — R0602 Shortness of breath: Secondary | ICD-10-CM | POA: Diagnosis not present

## 2021-01-30 DIAGNOSIS — Z87891 Personal history of nicotine dependence: Secondary | ICD-10-CM | POA: Diagnosis not present

## 2021-01-30 DIAGNOSIS — R531 Weakness: Secondary | ICD-10-CM | POA: Insufficient documentation

## 2021-01-30 DIAGNOSIS — O99411 Diseases of the circulatory system complicating pregnancy, first trimester: Secondary | ICD-10-CM | POA: Insufficient documentation

## 2021-01-30 DIAGNOSIS — Z3A00 Weeks of gestation of pregnancy not specified: Secondary | ICD-10-CM | POA: Insufficient documentation

## 2021-01-30 LAB — GC/CHLAMYDIA PROBE AMP (~~LOC~~) NOT AT ARMC
Chlamydia: NEGATIVE
Comment: NEGATIVE
Comment: NORMAL
Neisseria Gonorrhea: NEGATIVE

## 2021-01-30 LAB — URINALYSIS, COMPLETE (UACMP) WITH MICROSCOPIC
Bilirubin Urine: NEGATIVE
Glucose, UA: NEGATIVE mg/dL
Hgb urine dipstick: NEGATIVE
Ketones, ur: NEGATIVE mg/dL
Nitrite: NEGATIVE
Protein, ur: 100 mg/dL — AB
Specific Gravity, Urine: 1.019 (ref 1.005–1.030)
Squamous Epithelial / HPF: >50 — ABNORMAL HIGH (ref 0–5)
pH: 5 (ref 5.0–8.0)

## 2021-01-30 LAB — TROPONIN I (HIGH SENSITIVITY): Troponin I (High Sensitivity): <2 ng/L (ref <18)

## 2021-01-30 LAB — BASIC METABOLIC PANEL
Anion gap: 6 (ref 5–15)
BUN: 6 mg/dL (ref 6–20)
CO2: 21 mmol/L — ABNORMAL LOW (ref 22–32)
Calcium: 8.8 mg/dL — ABNORMAL LOW (ref 8.9–10.3)
Chloride: 106 mmol/L (ref 98–111)
Creatinine, Ser: 0.96 mg/dL (ref 0.44–1.00)
GFR, Estimated: >60 mL/min (ref >60)
Glucose, Bld: 115 mg/dL — ABNORMAL HIGH (ref 70–99)
Potassium: 3.6 mmol/L (ref 3.5–5.1)
Sodium: 133 mmol/L — ABNORMAL LOW (ref 135–145)

## 2021-01-30 LAB — CBC
HCT: 36.2 % (ref 36.0–46.0)
Hemoglobin: 12.5 g/dL (ref 12.0–15.0)
MCH: 30.6 pg (ref 26.0–34.0)
MCHC: 34.5 g/dL (ref 30.0–36.0)
MCV: 88.7 fL (ref 80.0–100.0)
Platelets: 242 10*3/uL (ref 150–400)
RBC: 4.08 MIL/uL (ref 3.87–5.11)
RDW: 12.5 % (ref 11.5–15.5)
WBC: 13 10*3/uL — ABNORMAL HIGH (ref 4.0–10.5)
nRBC: 0 % (ref 0.0–0.2)

## 2021-01-30 LAB — HCG, QUANTITATIVE, PREGNANCY: hCG, Beta Chain, Quant, S: 66619 m[IU]/mL — ABNORMAL HIGH (ref <5)

## 2021-01-30 MED ORDER — LIDOCAINE HCL URETHRAL/MUCOSAL 2 % EX GEL
1.0000 "application " | Freq: Once | CUTANEOUS | Status: AC
  Administered 2021-01-30: 1 via TOPICAL
  Filled 2021-01-30: qty 10

## 2021-01-30 NOTE — ED Triage Notes (Signed)
Vaginal irritation 

## 2021-01-30 NOTE — ED Notes (Signed)
Pt provided with po fluids per request.

## 2021-01-30 NOTE — ED Triage Notes (Addendum)
Pt via POV from home. Pt c/o centralized non-radiating CP that started 3-4 hours ago, endorses some SOB and weakness. Denies any significant cardiac hx. Pt is [redacted] weeks pregnant. Pt also endorses that she has been using a new baby powder in her vaginal region and now she is having burning sensation when she urinates. Pt is A&OX4 and NAD

## 2021-01-30 NOTE — ED Notes (Signed)
Informed by dawn in registration that pt states she was leaving. Pt no longer visualized in lobby for repeat vital signs and troponin.

## 2021-01-30 NOTE — ED Provider Notes (Signed)
[facility] EMERGENCY DEPARTMENT Provider Note   CSN: [id number] Arrival date & time: 01/30/21  1631     History No chief complaint on file.   Katie Roberson is a 30 y.o. female.  Pt reports she used suave body wash in vaginal area 2 days ago and now burning.  Pt is pregnant.  Pt was seen here 2 days ago and had a negative wet prep and gc and ct.  No abdominal pain or pregnancy complaint.    The history is provided by the patient. No language interpreter was used.      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   Candidal vaginitis 07/07/2019   Calculus of gallbladder with acute cholecystitis without obstruction    Cholecystitis 02/27/2018   Acute cholecystitis    Abnormal uterine bleeding (AUB) 02/08/2016   Screening examination for STD (sexually transmitted disease) 02/08/2016   Abnormal vaginal bleeding 01/29/2013   PCOS (polycystic ovarian syndrome) 12/26/2012   Morbid obesity (HCC) 07/16/2012    Past Surgical History:  Procedure Laterality Date   CHOLECYSTECTOMY N/A 02/28/2018   Procedure: LAPAROSCOPIC CHOLECYSTECTOMY;  Surgeon: Franky Macho, MD;  Location: [facility];  Service: General;  Laterality: N/A;     OB History     Gravida  2   Para  0   Term  0   Preterm  0   AB  1   Living  0      SAB  0   IAB  1   Ectopic  0   Multiple  0   Live Births              Family History  Problem Relation Age of Onset   Diabetes Paternal Grandmother     Social History   Tobacco Use   Smoking status: Former    Types: Cigarettes   Smokeless tobacco: Never  Vaping Use   Vaping Use: Never used  Substance Use Topics   Alcohol use: Yes    Comment:  socially    Drug use: No    Home Medications Prior to Admission medications   Medication Sig Start Date End Date Taking? Authorizing Provider  EPINEPHrine 0.3 mg/0.3 mL IJ SOAJ injection Inject 0.3 mg into the muscle as needed for anaphylaxis. 11/12/20   Nita Sickle, MD  fluconazole (DIFLUCAN) 150 MG tablet Take by mouth. Patient not taking: No sig reported 04/22/20   [provider]    Allergies    Grapefruit concentrate, Grapefruit extract, and Kiwi extract  Review of Systems   Review of Systems  All other systems reviewed and are negative.  Physical Exam Updated Vital Signs BP 124/76 (BP Location: Right Arm)   Pulse 92   Temp 99.8 F (37.7 C)   Resp 18   LMP 11/21/2020 (Approximate)   SpO2 95%   Physical Exam Vitals reviewed.  Cardiovascular:     Rate and Rhythm: Normal rate.  Pulmonary:     Effort: Pulmonary effort is normal.  Genitourinary:    General: Normal vulva.     Comments: No blistering, no erythema/skin discoloration  Skin:    General:  Skin is warm.  Neurological:     General: No focal deficit present.     Mental Status: She is alert.  Psychiatric:        Mood and Affect: Mood normal.    ED Results / Procedures / Treatments   Labs (all labs ordered are listed, but only abnormal results are displayed) Labs Reviewed - No data to display  EKG None  Radiology DG Chest 2 View  Result Date: 01/30/2021 CLINICAL DATA:  30 year old female with acute chest pain, shortness of breath and weakness in the 1st trimester of pregnancy. EXAM: CHEST - 2 VIEW COMPARISON:  Portable chest 07/30/2012. FINDINGS: PA and lateral views. Low normal lung volumes. Normal cardiac size and mediastinal contours. Visualized tracheal air column is within normal limits. Both lungs appear clear. No pneumothorax or pleural effusion. Cholecystectomy clips in the abdomen. The patient's abdomen is shielded. Negative visible bowel gas. No osseous abnormality identified.  IMPRESSION: No cardiopulmonary abnormality. Electronically Signed   By: Odessa Fleming M.D.   On: 01/30/2021 04:53    Procedures Procedures   Medications Ordered in ED Medications  lidocaine (XYLOCAINE) 2 % jelly 1 application (has no administration in time range)    ED Course  I have reviewed the triage vital signs and the nursing notes.  Pertinent labs & imaging results that were available during my care of the patient were reviewed by me and considered in my medical decision making (see chart for details).    MDM Rules/Calculators/A&P                           MDM:  Pt given topical lidocaine gel here to help with discomfort.  Pt advised to soak in cool water, Pt advised to use vagiseal or vagicaine to help with discomfort.  Stop using wash that caused irritation  Final Clinical Impression(s) / ED Diagnoses Final diagnoses:  Labial irritation    Rx / DC Orders ED Discharge Orders     None        Osie Cheeks 01/30/21 2225    Terrilee Files, MD 01/31/21 1100

## 2021-01-30 NOTE — Discharge Instructions (Signed)
Wah area well in cool tub without soap or irritants. Apply lidocaine to area to prevent burning.  (Vagiseal or vagicaine are common names )

## 2021-03-02 ENCOUNTER — Emergency Department (HOSPITAL_COMMUNITY)
Admission: EM | Admit: 2021-03-02 | Discharge: 2021-03-02 | Disposition: A | Discharge status: Home / Self Care | Payer: Medicaid Other | Attending: Emergency Medicine | Admitting: Emergency Medicine

## 2021-03-02 ENCOUNTER — Emergency Department (HOSPITAL_COMMUNITY): Payer: Medicaid Other

## 2021-03-02 ENCOUNTER — Other Ambulatory Visit: Payer: Self-pay

## 2021-03-02 ENCOUNTER — Encounter (HOSPITAL_COMMUNITY): Payer: Self-pay

## 2021-03-02 DIAGNOSIS — Z3490 Encounter for supervision of normal pregnancy, unspecified, unspecified trimester: Secondary | ICD-10-CM

## 2021-03-02 DIAGNOSIS — Y92410 Unspecified street and highway as the place of occurrence of the external cause: Secondary | ICD-10-CM | POA: Insufficient documentation

## 2021-03-02 DIAGNOSIS — Z3492 Encounter for supervision of normal pregnancy, unspecified, second trimester: Secondary | ICD-10-CM

## 2021-03-02 DIAGNOSIS — O9A211 Injury, poisoning and certain other consequences of external causes complicating pregnancy, first trimester: Secondary | ICD-10-CM | POA: Diagnosis present

## 2021-03-02 DIAGNOSIS — Z3A14 14 weeks gestation of pregnancy: Secondary | ICD-10-CM | POA: Diagnosis not present

## 2021-03-02 DIAGNOSIS — O368310 Maternal care for abnormalities of the fetal heart rate or rhythm, first trimester, not applicable or unspecified: Secondary | ICD-10-CM | POA: Diagnosis not present

## 2021-03-02 LAB — URINALYSIS, ROUTINE W REFLEX MICROSCOPIC
Bilirubin Urine: NEGATIVE
Glucose, UA: NEGATIVE mg/dL
Hgb urine dipstick: NEGATIVE
Ketones, ur: NEGATIVE mg/dL
Nitrite: NEGATIVE
Protein, ur: NEGATIVE mg/dL
Specific Gravity, Urine: 1.009 (ref 1.005–1.030)
pH: 6 (ref 5.0–8.0)

## 2021-03-02 MED ORDER — ACETAMINOPHEN 325 MG PO TABS
650.0000 mg | ORAL_TABLET | Freq: Once | ORAL | Status: AC
  Administered 2021-03-02: 650 mg via ORAL
  Filled 2021-03-02: qty 2

## 2021-03-02 MED ORDER — SODIUM CHLORIDE 0.9 % IV BOLUS
1000.0000 mL | Freq: Once | INTRAVENOUS | Status: AC
  Administered 2021-03-02: 1000 mL via INTRAVENOUS
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
## 2021-03-02 NOTE — Discharge Instructions (Signed)
The ultrasound today shows that you are [redacted] weeks pregnant.  Please make a follow-up appointment with your OB.  Return to the emergency department for any new or worsening symptoms.  You may take Tylenol every 4 hours if needed for soreness or body aches.

## 2021-03-02 NOTE — ED Notes (Signed)
Pt d/c home per MD order. Discharge summary reviewed with pt, pt verbalizes understanding. Ambulatory off unit. No s/s of acute distress noted at discharge . Discharged home with family.

## 2021-03-04 LAB — URINE CULTURE: Culture: 30000 — AB

## 2021-03-05 ENCOUNTER — Telehealth: Payer: Self-pay | Admitting: Emergency Medicine

## 2021-03-05 NOTE — Progress Notes (Signed)
ED Antimicrobial Stewardship Positive Culture Follow Up   Katie Roberson is an 31 y.o. female who presented to [facility] on 03/02/2021 with a chief complaint of  Chief Complaint  Patient presents with   Optician, dispensing    Recent Results (from the past 720 hour(s))  Urine Culture     Status: Abnormal   Collection Time: 03/02/21 12:52 PM   Specimen: Urine, Clean Catch  Result Value Ref Range Status   Specimen Description   Final    URINE, CLEAN CATCH Performed at [facility], [address], [place], [place] [address]    Special Requests   Final    NONE Performed at [facility], [address] [place], [place] [address]    Culture (A)  Final    30,000 COLONIES/mL STREPTOCOCCUS AGALACTIAE TESTING AGAINST S. AGALACTIAE NOT ROUTINELY PERFORMED DUE TO PREDICTABILITY OF AMP/PEN/VAN SUSCEPTIBILITY. WITHIN MIXED ORGANISMS Performed at [facility], [address] [place], [place] [address]    Report Status 03/04/2021 FINAL  Final    [x]  Culture findings above in the setting of no symptoms are likely not clinically significant given organism found and the cfu count. However, given pregnancy status, please have patient follow up with OB/GYN for further instruction/treatment. Provide return precautions.  ED Provider: , DO   Tanda Rockers, PharmD, BCPS 03/05/2021 11:02 AM ED Clinical Pharmacist -  [phone]

## 2021-03-05 NOTE — Telephone Encounter (Signed)
Post ED Visit - Positive Culture Follow-up: Unsuccessful Patient Follow-up  Culture assessed and recommendations reviewed by:  []  Elenor Quinones, Pharm.D. []  Heide Guile, Pharm.D., BCPS AQ-ID []  Parks Neptune, Pharm.D., BCPS []  Alycia Rossetti, Pharm.D., BCPS []  [place], Pharm.D., BCPS, AAHIVP []  Legrand Como, Pharm.D., BCPS, AAHIVP []  Lorelei Pont, PharmD []  Vincenza Hews, PharmD, BCPS  Positive urine culture  []  Patient discharged without antimicrobial prescription and treatment is now indicated []  Organism is resistant to prescribed ED discharge antimicrobial []  Patient with positive blood cultures   Unable to contact patient by phone, letter will be sent to address on file.  Plan:  Follow-up with OB/GYN. Wynona Dove, MD  Milus Mallick 03/05/2021, 4:44 PM

## 2021-04-13 ENCOUNTER — Encounter (HOSPITAL_COMMUNITY): Payer: Self-pay | Admitting: *Deleted

## 2021-04-13 ENCOUNTER — Emergency Department (HOSPITAL_COMMUNITY)
Admission: EM | Admit: 2021-04-13 | Discharge: 2021-04-13 | Disposition: A | Discharge status: Home / Self Care | Payer: Medicaid Other | Attending: Emergency Medicine | Admitting: Emergency Medicine

## 2021-04-13 DIAGNOSIS — N898 Other specified noninflammatory disorders of vagina: Secondary | ICD-10-CM

## 2021-04-13 DIAGNOSIS — O209 Hemorrhage in early pregnancy, unspecified: Secondary | ICD-10-CM | POA: Insufficient documentation

## 2021-04-13 DIAGNOSIS — Z3A18 18 weeks gestation of pregnancy: Secondary | ICD-10-CM | POA: Diagnosis not present

## 2021-04-13 DIAGNOSIS — O23592 Infection of other part of genital tract in pregnancy, second trimester: Secondary | ICD-10-CM | POA: Diagnosis not present

## 2021-04-13 LAB — WET PREP, GENITAL
Clue Cells Wet Prep HPF POC: NONE SEEN
Sperm: NONE SEEN
Trich, Wet Prep: NONE SEEN
WBC, Wet Prep HPF POC: >=10 — AB (ref <10)
Yeast Wet Prep HPF POC: NONE SEEN

## 2021-04-13 NOTE — ED Triage Notes (Signed)
POSSIBLE YEAST INFECTION FOR A MONTH, PATIENT IS PREGNANT

## 2021-04-13 NOTE — ED Provider Notes (Addendum)
[facility] EMERGENCY DEPARTMENT Provider Note   CSN: [id number] Arrival date & time: 04/13/21  1326     History  No chief complaint on file.   Katie Roberson is a G71P0010 31 y.o. female who presents to the ED complaining of yeast infection x1 month.  She is currently 5 months pregnant.  She receives her OB care at [facility].  Patient has a follow-up appointment with [facility] OB on next week.  Denies past medical history of diabetes.  Has associated vaginal spotting onset today.  Has not tried any medications for her symptoms denies fever, chills, nausea, vomiting.    The history is provided by the patient. No language interpreter was used.      Home Medications Prior to Admission medications   Medication Sig Start Date End Date Taking? Authorizing Provider  EPINEPHrine 0.3 mg/0.3 mL IJ SOAJ injection Inject 0.3 mg into the muscle as needed for anaphylaxis. 11/12/20   Rudene Re, MD  fluconazole (DIFLUCAN) 150 MG tablet Take by mouth. Patient not taking: No sig reported 04/22/20   [provider]      Allergies    Grapefruit concentrate, Grapefruit extract, and Kiwi extract    Review of Systems   Review of Systems  Constitutional:  Negative for chills and fever.  Gastrointestinal:  Negative for nausea and vomiting.  Genitourinary:  Negative for dysuria and hematuria.  Skin:  Negative for rash.  All other systems reviewed and are negative.  Physical Exam Updated Vital Signs BP 115/84    Pulse 78    Temp 97.9 F (36.6 C) (Oral)    Resp 18    Ht 5\' 8"  (1.727 m)    Wt (!) 144.2 kg    LMP 11/21/2020 (Approximate)    SpO2 100%    BMI 48.35 kg/m  Physical Exam Vitals and nursing note reviewed. Exam conducted with a chaperone present.  Constitutional:      General: She is not in acute distress.    Appearance: She is well-developed. She is not diaphoretic.  HENT:     Head: Normocephalic and atraumatic.     Mouth/Throat:     Pharynx: No  oropharyngeal exudate.  Eyes:     General: No scleral icterus.    Conjunctiva/sclera: Conjunctivae normal.  Cardiovascular:     Rate and Rhythm: Normal rate and regular rhythm.     Pulses: Normal pulses.     Heart sounds: Normal heart sounds. No murmur heard. Pulmonary:     Effort: Pulmonary effort is normal. No respiratory distress.     Breath sounds: Normal breath sounds. No wheezing.  Abdominal:     General: Bowel sounds are normal.     Palpations: Abdomen is soft. There is no mass.     Tenderness: There is no abdominal tenderness. There is no guarding or rebound.  Genitourinary:    General: Normal vulva.     Labia:        Right: No rash, tenderness or lesion.        Left: No rash, tenderness or lesion.      Vagina: No signs of injury and foreign body. Vaginal discharge (Copious white) present. No erythema, tenderness or bleeding.     Cervix: No cervical motion tenderness, discharge, friability or cervical bleeding.     Uterus: Not deviated, not enlarged, not fixed and not tender.      Adnexa:        Right: No mass, tenderness or fullness.  Left: No mass, tenderness or fullness.       Comments: RN chaperone present for exam.  Area of demarcation noted to bilateral buttocks.  No surrounding erythema or drainage noted. Musculoskeletal:        General: Normal range of motion.     Cervical back: Normal range of motion and neck supple.  Skin:    General: Skin is warm and dry.     Findings: No erythema.  Neurological:     Mental Status: She is alert.  Psychiatric:        Behavior: Behavior normal.    ED Results / Procedures / Treatments   Labs (all labs ordered are listed, but only abnormal results are displayed) Labs Reviewed  WET PREP, GENITAL - Abnormal; Notable for the following components:      Result Value   WBC, Wet Prep HPF POC >=10 (*)    All other components within normal limits  GC/CHLAMYDIA PROBE AMP ([place]) NOT AT [facility]     EKG None  Radiology No results found.  Procedures Procedures    Medications Ordered in ED Medications - No data to display  ED Course/ Medical Decision Making/ A&P Clinical Course as of 04/13/21 1917  Thu Apr 13, 2021  1851 Discussed lab findings and discharge treatment plan.  Patient agreeable at this time.  Patient for safe for discharge. [SB]    Clinical Course User Index [SB] Harry Bark A, PA-C                           Medical Decision Making Amount and/or Complexity of Data Reviewed Labs: ordered.   Patient is a G2P0010 presents to the ED complaining of possible yeast infection x1 month.  Denies past medical history of diabetes.  Patient is currently 5 months pregnant and receives her OB care unit in stable health department.  Estimated due date is 09/09/2021.  Patient has plans to deliver through [facility]  Vital signs stable, patient afebrile, not tachycardic or hypoxic.  Fetal heart tones 135-145 bpm.  On exam patient without acute cardiovascular, respiratory, abdominal exam findings.  GU exam performed with RN chaperone, moderate copious vaginal discharge, no vaginal or cervical bleeding noted.  Review of chart notes IUP on ultrasound in November 2022 at [facility].  Differential diagnosis includes Candida vaginitis, atopic dermatitis, contact dermatitis.   Labs:  I ordered, and personally interpreted labs.  The pertinent results include:   Wet prep unremarkable Gonorrhea/chlamydia obtained with results pending at time of discharge   Disposition: Patient presentation suspicious for dermatitis.  Doubt Candida vaginitis or contact dermatitis at this time.  Fetal heart tones intact ranging from 135-145. After consideration of the diagnostic results and the patients response to treatment, I feel that the patient would benefit from discharge home with over-the-counter Desitin.  Discussed close follow-up with OB.  Discussed with patient to maintain follow-up with OB at [facility] next  week. Supportive care measures and strict return precautions discussed with patient at bedside. Pt acknowledges and verbalizes understanding. Pt appears safe for discharge. Follow up as indicated in discharge paperwork.   This chart was dictated using voice recognition software, Dragon. Despite the best efforts of this provider to proofread and correct errors, errors may still occur which can change documentation meaning.   Final Clinical Impression(s) / ED Diagnoses Final diagnoses:  [redacted] weeks gestation of pregnancy  Vaginal itching    Rx / DC Orders ED Discharge Orders  None         Kandee Escalante A, PA-C 04/13/21 [address] Nazanin Kinner A, PA-C 04/14/21 1104    Fredia Sorrow, MD 04/20/21 [phone]

## 2021-04-13 NOTE — Discharge Instructions (Addendum)
It was a pleasure taking care of you today!   Your gonorrhea and Chlamydia swab results are pending and will result on MyChart.  You will be notified of any abnormal findings.  You may give over-the-counter Desitin cream placed to your buttocks to help with your symptoms.  Ensure to keep the area clean and dry.  You may follow-up with your OB as needed regarding today's ED visit.  You may follow with your primary care provider as needed.  Return to the emergency department if experiencing increasing/worsening abdominal pain, vaginal bleeding, loss of fluids, worsening symptoms.

## 2021-04-13 NOTE — ED Notes (Signed)
Fetal Heart Tones 135-145 bpm.

## 2021-04-14 LAB — GC/CHLAMYDIA PROBE AMP (~~LOC~~) NOT AT ARMC
Chlamydia: NEGATIVE
Comment: NEGATIVE
Comment: NORMAL
Neisseria Gonorrhea: NEGATIVE

## 2021-05-17 ENCOUNTER — Encounter (HOSPITAL_COMMUNITY): Payer: Self-pay | Admitting: Obstetrics & Gynecology

## 2021-05-17 ENCOUNTER — Ambulatory Visit: Payer: Self-pay

## 2021-05-17 ENCOUNTER — Other Ambulatory Visit: Payer: Self-pay

## 2021-05-17 ENCOUNTER — Inpatient Hospital Stay (HOSPITAL_COMMUNITY)
Admission: AD | Admit: 2021-05-17 | Discharge: 2021-05-17 | Disposition: A | Discharge status: Home / Self Care | Payer: Medicaid Other | Attending: Obstetrics & Gynecology | Admitting: Obstetrics & Gynecology

## 2021-05-17 DIAGNOSIS — Z3A25 25 weeks gestation of pregnancy: Secondary | ICD-10-CM

## 2021-05-17 DIAGNOSIS — O98819 Other maternal infectious and parasitic diseases complicating pregnancy, unspecified trimester: Secondary | ICD-10-CM | POA: Diagnosis not present

## 2021-05-17 DIAGNOSIS — O99282 Endocrine, nutritional and metabolic diseases complicating pregnancy, second trimester: Secondary | ICD-10-CM | POA: Diagnosis not present

## 2021-05-17 DIAGNOSIS — O10919 Unspecified pre-existing hypertension complicating pregnancy, unspecified trimester: Secondary | ICD-10-CM

## 2021-05-17 DIAGNOSIS — B3731 Acute candidiasis of vulva and vagina: Secondary | ICD-10-CM | POA: Diagnosis not present

## 2021-05-17 DIAGNOSIS — O23592 Infection of other part of genital tract in pregnancy, second trimester: Secondary | ICD-10-CM | POA: Diagnosis not present

## 2021-05-17 DIAGNOSIS — O10912 Unspecified pre-existing hypertension complicating pregnancy, second trimester: Secondary | ICD-10-CM | POA: Diagnosis not present

## 2021-05-17 DIAGNOSIS — O98812 Other maternal infectious and parasitic diseases complicating pregnancy, second trimester: Secondary | ICD-10-CM | POA: Diagnosis present

## 2021-05-17 MED ORDER — FLUCONAZOLE 150 MG PO TABS: 150.0000 mg | ORAL_TABLET | Freq: Once | ORAL | 0 refills | Status: AC

## 2021-05-17 MED ORDER — FLUCONAZOLE 150 MG PO TABS
150.0000 mg | ORAL_TABLET | Freq: Once | ORAL | Status: AC
  Administered 2021-05-17: 150 mg via ORAL
  Filled 2021-05-17: qty 1

## 2021-05-17 NOTE — MAU Provider Note (Signed)
?History  ?  ? ?CSN: [id number] ? ?Arrival date and time: 05/17/21 1907 ? ? Event Date/Time  ? First Provider Initiated Contact with Patient 05/17/21 2005   ?  ? ?Chief Complaint  ?Patient presents with  ? Rash  ? ?HPI ?Katie Roberson is a 31 y.o. G2P0010 at [redacted]w[redacted]d who presents with a yeast infection. She reports she has struggled with yeast for several weeks and has been trying terazol with no relief. She reports she is frustrated that the medication is not helping. She denies any bleeding or leaking of fluid. She reports normal fetal movement. She reports CHTN, not on medication and polyhydramnios. She gets prenatal care at the [facility] ? ?OB History   ? ? Gravida  ?2  ? Para  ?0  ? Term  ?0  ? Preterm  ?0  ? AB  ?1  ? Living  ?0  ?  ? ? SAB  ?0  ? IAB  ?1  ? Ectopic  ?0  ? Multiple  ?0  ? Live Births  ?   ?   ?  ?  ? ? ?Past Medical History:  ?Diagnosis Date  ? Anxiety   ? Chicken pox   ? Chronic abdominal pain   ? Chronic pelvic pain in female   ? Dysmenorrhea   ? Gallstones   ? PCOS (polycystic ovarian syndrome)   ? Vaginal pain   ? chronic  ? ? ?Past Surgical History:  ?Procedure Laterality Date  ? CHOLECYSTECTOMY N/A 02/28/2018  ? Procedure: LAPAROSCOPIC CHOLECYSTECTOMY;  Surgeon: Franky Macho, MD;  Location: [facility];  Service: General;  Laterality: N/A;  ? ? ?Family History  ?Problem Relation Age of Onset  ? Diabetes Paternal Grandmother   ? ? ?Social History  ? ?Tobacco Use  ? Smoking status: Former  ?  Types: Cigarettes  ? Smokeless tobacco: Never  ?Vaping Use  ? Vaping Use: Never used  ?Substance Use Topics  ? Alcohol use: Not Currently  ?  Comment: socially- not since pregnancy  ? Drug use: No  ? ? ?Allergies:  ?Allergies  ?Allergen Reactions  ? Grapefruit Concentrate   ? Grapefruit Extract Other (See Comments)  ? Kiwi Extract Hives  ?  Patient is allergic to kiwi. Causes facial swelling ?Patient is allergic to kiwi. Causes facial swelling  ? ? ?Medications Prior to Admission   ?Medication Sig Dispense Refill Last Dose  ? Prenatal Vit-Fe Fumarate-FA (PRENATAL MULTIVITAMIN) TABS tablet Take 1 tablet by mouth daily at 12 noon.   05/17/2021  ? valACYclovir (VALTREX) 500 MG tablet Take 500 mg by mouth 2 (two) times daily.   05/17/2021  ? EPINEPHrine 0.3 mg/0.3 mL IJ SOAJ injection Inject 0.3 mg into the muscle as needed for anaphylaxis. 1 each 1   ? fluconazole (DIFLUCAN) 150 MG tablet Take by mouth. (Patient not taking: No sig reported)     ? ? ?Review of Systems  ?Constitutional: Negative.  Negative for fatigue and fever.  ?HENT: Negative.    ?Respiratory: Negative.  Negative for shortness of breath.   ?Cardiovascular: Negative.  Negative for chest pain.  ?Gastrointestinal: Negative.  Negative for abdominal pain, constipation, diarrhea, nausea and vomiting.  ?Genitourinary:  Positive for vaginal discharge and vaginal pain. Negative for dysuria and vaginal bleeding.  ?Neurological: Negative.  Negative for dizziness and headaches.  ?Physical Exam  ? ?Blood pressure 125/77, pulse 85, temperature 97.8 ?F (36.6 ?C), resp. rate 18, height 5\' 8"  (1.727 m), weight (!) 149.7 kg,  last menstrual period 11/21/2020, SpO2 100 %. ? ?Physical Exam ?Vitals and nursing note reviewed.  ?Constitutional:   ?   General: She is not in acute distress. ?   Appearance: She is well-developed.  ?HENT:  ?   Head: Normocephalic.  ?Eyes:  ?   Pupils: Pupils are equal, round, and reactive to light.  ?Cardiovascular:  ?   Rate and Rhythm: Normal rate and regular rhythm.  ?   Heart sounds: Normal heart sounds.  ?Pulmonary:  ?   Effort: Pulmonary effort is normal. No respiratory distress.  ?   Breath sounds: Normal breath sounds.  ?Abdominal:  ?   General: Bowel sounds are normal. There is no distension.  ?   Palpations: Abdomen is soft.  ?   Tenderness: There is no abdominal tenderness.  ?Genitourinary: ?   Pubic Area: Rash present.  ?   Labia:     ?   Right: Rash present.     ?   Left: Rash present.   ?   Comments: Thick  white adherent discharge noted inside labia and around introitus. Erythematous rash on inner thighs ?Skin: ?   General: Skin is warm and dry.  ?Neurological:  ?   Mental Status: She is alert and oriented to person, place, and time.  ?Psychiatric:     ?   Mood and Affect: Mood normal.     ?   Behavior: Behavior normal.     ?   Thought Content: Thought content normal.     ?   Judgment: Judgment normal.  ? ?Fetal Tracing: ? ?Baseline: 140 ?Variability: moderate ?Accels: 10x10 ?Decels: none ? ?Toco: none ? ?MAU Course  ?Procedures ? ?MDM ?Patient declines swabs in MAU, desires CNM to look at rash and treat tonight.  ? ?Risks and benefits of diflucan discussed with patient. Patient desires to not do anymore terazol and try PO.  ? ?Diflucan PO ? ?Assessment and Plan  ? ?1. Candidiasis of vagina during pregnancy   ?2. [redacted] weeks gestation of pregnancy   ?3. Chronic hypertension affecting pregnancy   ? ?-Discharge home in stable condition ?-Rx for diflucan sent to patient's pharmacy ?-Yeast precautions discussed ?-Patient advised to follow-up with OB as scheduled for prenatal care ?-Patient may return to MAU as needed or if her condition were to change or worsen ? ? ?Rolm Bookbinder CNM ?05/17/2021, 8:06 PM  ?

## 2021-05-17 NOTE — Discharge Instructions (Signed)

## 2021-05-17 NOTE — Telephone Encounter (Signed)
?  Chief Complaint: vaginal rash ?Symptoms: vaginal rash that has gotten worse and not working with cream. Pain and severe itching ?Frequency: few days ?Pertinent Negatives: NA ?Disposition: [x] ED /[] Urgent Care (no appt availability in office) / [] Appointment(In office/virtual)/ []  [facility]/ [] Home Care/ [] Refused Recommended Disposition /[] [facility]/ []  Follow-up with PCP ?Additional Notes: pt states the cream that she is has isn't helping and every time her and her partner have sex they get a rash. She is ready to go back to the ED but feels like they wont treat her properly because she's pregnant. I advised her to go to [facility] ED to be seen.  ? ? ?Reason for Disposition ? Patient sounds very sick or weak to the triager ? ?Answer Assessment - Initial Assessment Questions ?1. SYMPTOM: "What's the main symptom you're concerned about?" (e.g., pain, itching, dryness) ?    Pain, itching, discharge ?2. LOCATION: "Where is the  sx located?" (e.g., inside/outside, left/right) ?    vaginally ?3. ONSET: "When did the  sx  start?" ?    Ongoing for few days ?4. PAIN: "Is there any pain?" If Yes, ask: "How bad is it?" (Scale: 1-10; mild, moderate, severe) ?    10 ?5. ITCHING: "Is there any itching?" If Yes, ask: "How bad is it?" (Scale: 1-10; mild, moderate, severe) ?    10 ?6. CAUSE: "What do you think is causing the discharge?" "Have you had the same problem before? What happened then?" ?    HSV 2 and Candidals  ?7. OTHER SYMPTOMS: "Do you have any other symptoms?" (e.g., fever, itching, vaginal bleeding, pain with urination, injury to genital area, vaginal foreign body) ?    No ?8. PREGNANCY: "Is there any chance you are pregnant?" "When was your last menstrual period?" ?    Yes 27 weeks ? ?Protocols used: Vaginal Symptoms-A-AH ? ?

## 2021-05-17 NOTE — MAU Note (Signed)
Katie Roberson is a 31 y.o. 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 Also have candida. Using some cream that is not working. When I had intercourse with my partner he broke out and is itching. Having clumpy, white vag d/c.  ?LMP: [facility] 08/30/21 per pt ?Onset of complaint: HSV2 diagnosis last yr and yeast has been off and on for awhile ?Pain score: 10 ?There were no vitals filed for this visit.   ?FHT:145 ?Lab orders placed from triage: none ? ?

## 2021-08-12 ENCOUNTER — Observation Stay
Admission: EM | Admit: 2021-08-12 | Discharge: 2021-08-13 | Disposition: A | Discharge status: Home / Self Care | Payer: Medicaid Other | Attending: Obstetrics | Admitting: Obstetrics

## 2021-08-12 ENCOUNTER — Encounter: Payer: Self-pay | Admitting: Obstetrics

## 2021-08-12 ENCOUNTER — Other Ambulatory Visit: Payer: Self-pay

## 2021-08-12 DIAGNOSIS — W1839XA Other fall on same level, initial encounter: Secondary | ICD-10-CM | POA: Insufficient documentation

## 2021-08-12 DIAGNOSIS — O9A213 Injury, poisoning and certain other consequences of external causes complicating pregnancy, third trimester: Secondary | ICD-10-CM | POA: Insufficient documentation

## 2021-08-12 DIAGNOSIS — R109 Unspecified abdominal pain: Secondary | ICD-10-CM | POA: Diagnosis not present

## 2021-08-12 DIAGNOSIS — O471 False labor at or after 37 completed weeks of gestation: Secondary | ICD-10-CM | POA: Diagnosis present

## 2021-08-12 DIAGNOSIS — Z3A37 37 weeks gestation of pregnancy: Secondary | ICD-10-CM | POA: Insufficient documentation

## 2021-08-12 MED ORDER — ACETAMINOPHEN 325 MG PO TABS
650.0000 mg | ORAL_TABLET | ORAL | Status: DC | PRN
  Administered 2021-08-12: 650 mg via ORAL
  Filled 2021-08-12: qty 2

## 2021-08-12 NOTE — OB Triage Note (Signed)
Pt states that she fell around 1-2 hours ago and landed on her right knee and then fell to her abdomen. Pt states that she has felt baby move the whole time.

## 2021-08-13 DIAGNOSIS — Z3A37 37 weeks gestation of pregnancy: Secondary | ICD-10-CM | POA: Diagnosis not present

## 2021-08-13 DIAGNOSIS — W19XXXA Unspecified fall, initial encounter: Secondary | ICD-10-CM | POA: Diagnosis not present

## 2021-08-13 DIAGNOSIS — O471 False labor at or after 37 completed weeks of gestation: Secondary | ICD-10-CM | POA: Diagnosis not present

## 2021-09-20 ENCOUNTER — Ambulatory Visit
Admission: RE | Admit: 2021-09-20 | Discharge: 2021-09-20 | Disposition: A | Discharge status: Home / Self Care | Payer: Medicaid Other | Attending: General Practice | Admitting: General Practice

## 2021-09-20 ENCOUNTER — Other Ambulatory Visit: Payer: Self-pay | Admitting: General Practice

## 2021-09-20 ENCOUNTER — Ambulatory Visit
Admission: RE | Admit: 2021-09-20 | Discharge: 2021-09-20 | Disposition: A | Discharge status: Home / Self Care | Payer: Medicaid Other | Source: Ambulatory Visit | Attending: General Practice | Admitting: General Practice

## 2021-09-20 DIAGNOSIS — M545 Low back pain, unspecified: Secondary | ICD-10-CM | POA: Diagnosis present

## 2021-09-20 DIAGNOSIS — G8929 Other chronic pain: Secondary | ICD-10-CM | POA: Diagnosis present

## 2021-11-18 ENCOUNTER — Encounter: Payer: Self-pay | Admitting: Emergency Medicine

## 2021-11-18 ENCOUNTER — Other Ambulatory Visit: Payer: Self-pay

## 2021-11-18 ENCOUNTER — Emergency Department
Admission: EM | Admit: 2021-11-18 | Discharge: 2021-11-18 | Disposition: A | Discharge status: Home / Self Care | Payer: Medicaid Other | Attending: Emergency Medicine | Admitting: Emergency Medicine

## 2021-11-18 DIAGNOSIS — L509 Urticaria, unspecified: Secondary | ICD-10-CM | POA: Diagnosis not present

## 2021-11-18 DIAGNOSIS — T7840XA Allergy, unspecified, initial encounter: Secondary | ICD-10-CM | POA: Diagnosis present

## 2021-11-18 MED ORDER — FAMOTIDINE 20 MG PO TABS: 20.0000 mg | ORAL_TABLET | Freq: Two times a day (BID) | ORAL | 0 refills | Status: AC

## 2021-11-18 MED ORDER — FAMOTIDINE 20 MG PO TABS
20.0000 mg | ORAL_TABLET | Freq: Once | ORAL | Status: AC
Start: 2021-11-18 — End: 2021-11-18
  Administered 2021-11-18: 20 mg via ORAL
  Filled 2021-11-18: qty 1

## 2021-11-18 MED ORDER — PREDNISONE 20 MG PO TABS: ORAL_TABLET | ORAL | 0 refills | Status: DC

## 2021-11-18 MED ORDER — PREDNISONE 20 MG PO TABS
60.0000 mg | ORAL_TABLET | Freq: Once | ORAL | Status: AC
  Administered 2021-11-18: 60 mg via ORAL
  Filled 2021-11-18: qty 3

## 2021-11-18 NOTE — ED Triage Notes (Signed)
Pt reports after eating she began having itching across arms and neck as well as tingling in mouth. No rash, Denies SOB. Breathing regular and even. Symptoms began last night

## 2021-11-18 NOTE — ED Provider Notes (Signed)
[facility] Provider Note    Event Date/Time   First MD Initiated Contact with Patient 11/18/21 [phone]     (approximate)   History   Allergic Reaction   HPI  Katie Roberson is a 31 y.o. female  who presents to the ED from home with a chief complaint of allergic reaction. Patient began to experience hives before she went to bed. Awoke to change her daughter's diaper when she noted additional hives to her neck and  a tickling in her throat. Did not have Benadryl in her house. Denies facial swelling, chest pain, shortness of breath, abdominal pain, nausea, vomiting or diarrhea. Denies new medicines, OTC or environmental other than new mint dental floss. Has been on Valtrex for months and Lexapro for over one week.  Denies new foods.      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   Candidal vaginitis 07/07/2019   Calculus of gallbladder with acute cholecystitis without obstruction    Cholecystitis 02/27/2018   Acute cholecystitis    Abnormal uterine bleeding (AUB) 02/08/2016   Screening examination for STD (sexually transmitted disease) 02/08/2016   Abnormal vaginal bleeding 01/29/2013   PCOS (polycystic ovarian syndrome) 12/26/2012   Morbid obesity ([place]) 07/16/2012     Past Surgical History   Past Surgical History:  Procedure Laterality Date   CHOLECYSTECTOMY N/A 02/28/2018   Procedure: LAPAROSCOPIC CHOLECYSTECTOMY;  Surgeon: Aviva Signs, MD;  Location: [facility];  Service: General;  Laterality: N/A;     Home Medications   Prior to Admission medications    Medication Sig Start Date End Date Taking? Authorizing Provider  famotidine (PEPCID) 20 MG tablet Take 1 tablet (20 mg total) by mouth 2 (two) times daily. 11/18/21  Yes Paulette Blanch, MD  predniSONE (DELTASONE) 20 MG tablet 3 tablets daily x 4 days 11/18/21  Yes Paulette Blanch, MD  EPINEPHrine 0.3 mg/0.3 mL IJ SOAJ injection Inject 0.3 mg into the muscle as needed for anaphylaxis. 11/12/20   Rudene Re, MD  Prenatal Vit-Fe Fumarate-FA (PRENATAL MULTIVITAMIN) TABS tablet Take 1 tablet by mouth daily at 12 noon.    [provider]  valACYclovir (VALTREX) 500 MG tablet Take 500 mg by mouth 2 (two) times daily.    [provider]     Allergies  Grapefruit concentrate, Grapefruit extract, Kiwi extract, and Soap   Family History   Family History  Problem Relation Age of Onset   Diabetes Paternal Grandmother      Physical Exam  Triage Vital Signs: ED Triage Vitals  Enc Vitals Group     BP 11/18/21 0558 130/76     Pulse Rate 11/18/21 0558 71     Resp 11/18/21 0558 18     Temp 11/18/21 0558 97.9 F (36.6 C)     Temp Source 11/18/21 0558 Oral     SpO2 11/18/21 0558 97 %     Weight 11/18/21 0558 230 lb (104.3 kg)     Height 11/18/21 0558 5\' 8"  (  1.727 m)     Head Circumference --      Peak Flow --      Pain Score 11/18/21 0602 0     Pain Loc --      Pain Edu? --      Excl. in GC? --     Updated Vital Signs: BP 130/76 (BP Location: Left Arm)   Pulse 71   Temp 97.9 F (36.6 C) (Oral)   Resp 18   Ht 5\' 8"  (1.727 m)   Wt 104.3 kg   LMP 09/22/2021 (Approximate)   SpO2 97%   Breastfeeding Unknown   BMI 34.97 kg/m    General: Awake, no distress.  CV:  RRR.  Good peripheral perfusion.  Resp:  Normal effort.  CTA B. Abd:  Nontender.  No distention.  Other:  No angioedema.  Posterior oropharynx is clear.  There is no hoarse or muffled voice.  There is no drooling.  Tolerating secretions well.  No neck swelling.  Scattered hives to neck, bilateral arms and  legs.   ED Results / Procedures / Treatments  Labs (all labs ordered are listed, but only abnormal results are displayed) Labs Reviewed - No data to display   EKG  None   RADIOLOGY None   Official radiology report(s): No results found.   PROCEDURES:  Critical Care performed: No  Procedures   MEDICATIONS ORDERED IN ED: Medications  predniSONE (DELTASONE) tablet 60 mg (has no administration in time range)  famotidine (PEPCID) tablet 20 mg (has no administration in time range)     IMPRESSION / MDM / ASSESSMENT AND PLAN / ED COURSE  I reviewed the triage vital signs and the nursing notes.                             31 year old female presenting with allergic reaction with hives.  Clinically does not require EpiPen at this time.  Patient is driving so we will hold Benadryl.  Will administer oral Prednisone and Pepcid and discharged home with prescriptions and ENT follow-up for allergy testing.  Strict return precautions given.  Patient verbalizes understanding and agrees with plan of care.  Patient's presentation is most consistent with acute, uncomplicated illness.  FINAL CLINICAL IMPRESSION(S) / ED DIAGNOSES   Final diagnoses:  Allergic reaction, initial encounter  Hives     Rx / DC Orders   ED Discharge Orders          Ordered    predniSONE (DELTASONE) 20 MG tablet        11/18/21 0622    famotidine (PEPCID) 20 MG tablet  2 times daily        11/18/21 11/20/21             Note:  This document was prepared using Dragon voice recognition software and may include unintentional dictation errors.   1610, MD 11/18/21 [phone]

## 2021-11-18 NOTE — Discharge Instructions (Signed)
1. Take the following medicines for the next 4 days: °Prednisone 60mg daily °Pepcid 20mg twice daily °2. Take Benadryl as needed for itching. °3. Return to the ER for worsening symptoms, persistent vomiting, difficulty breathing or other concerns.  °

## 2021-11-18 NOTE — ED Notes (Signed)
Signing pad did not work, pt verbalized understanding of dc instructions.  

## 2021-11-30 IMAGING — US US OB < 14 WEEKS - US OB TV
1 series · 14 of 28 positions shown · non-contrast
Comparison: None.

CLINICAL DATA: Abdominal cramping, beta HCG 31,084

EXAM:
OBSTETRIC <14 WK US AND TRANSVAGINAL OB US
TECHNIQUE: Both transabdominal and transvaginal ultrasound examinations were
performed for complete evaluation of the gestation as well as the
maternal uterus, adnexal regions, and pelvic cul-de-sac.
Transvaginal technique was performed to assess early pregnancy.

[Series 1: us ob less than 14 weeks with ob transvaginal · 72 acquisitions, 14 frames shown]
[im 3/72]
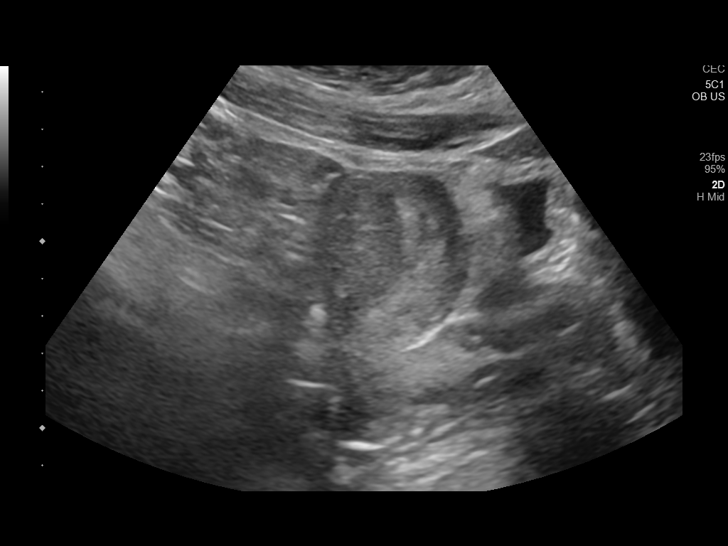
[im 8/72]
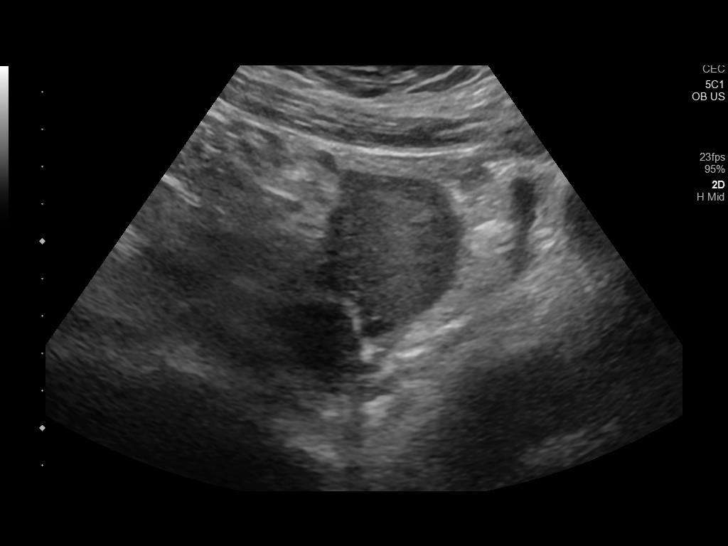
[im 14/72]
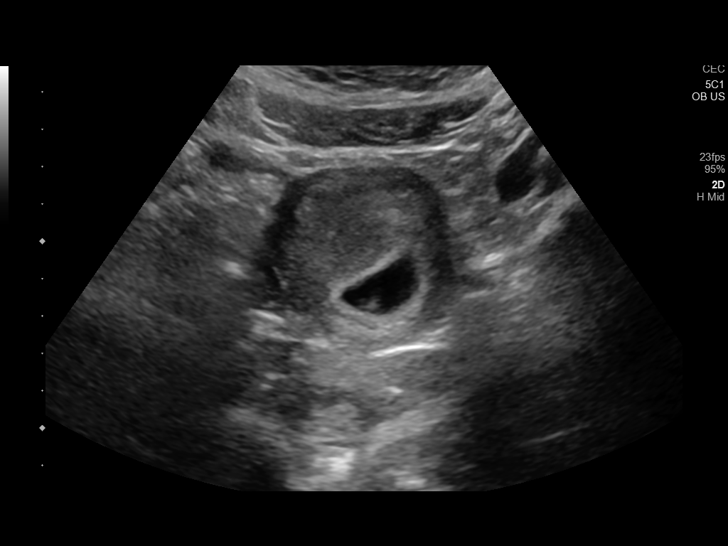
[im 19/72]
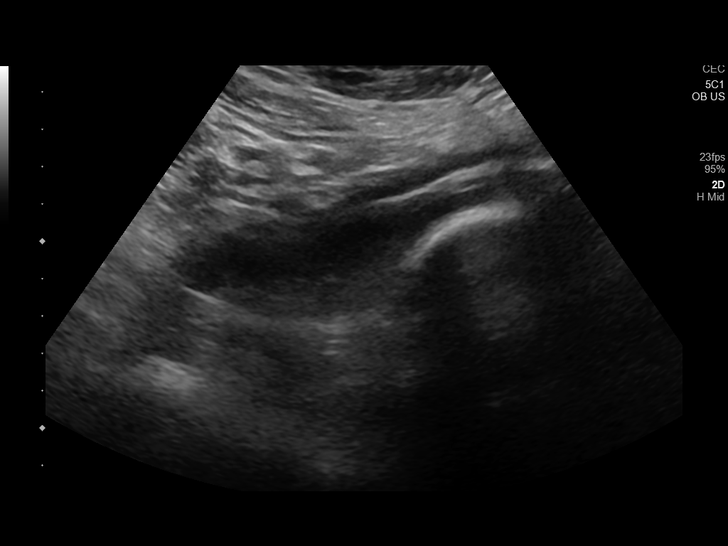
[im 24/72]
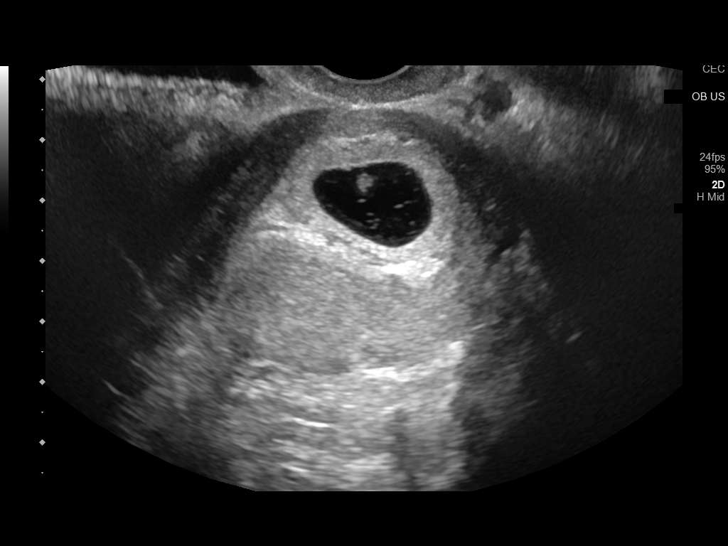
[im 29/72]
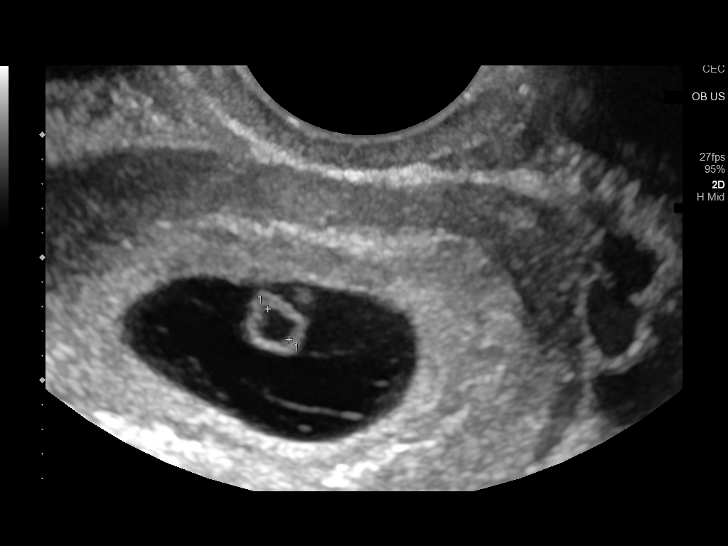
[im 35/72]
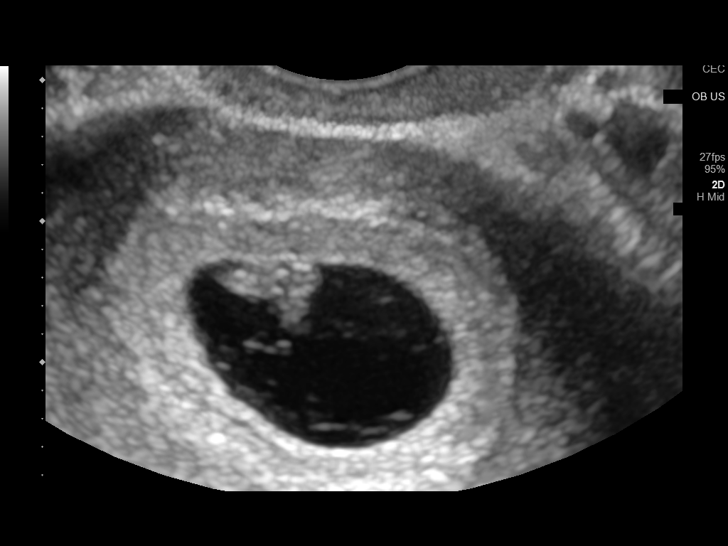
[im 40/72]
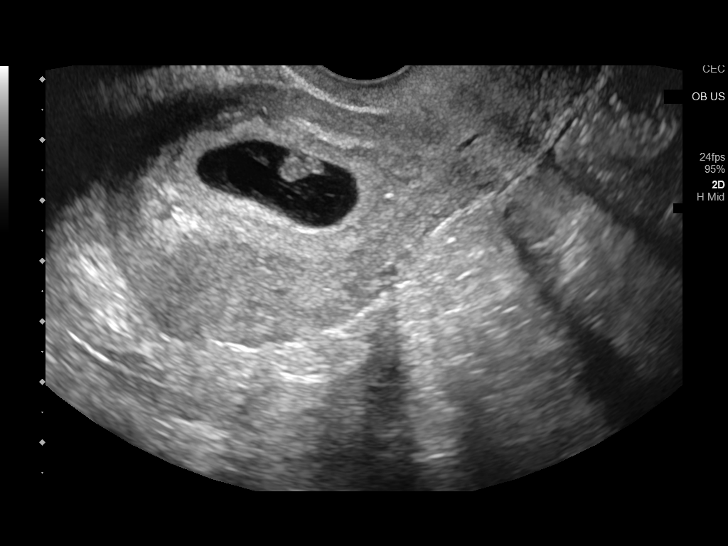
[im 45/72]
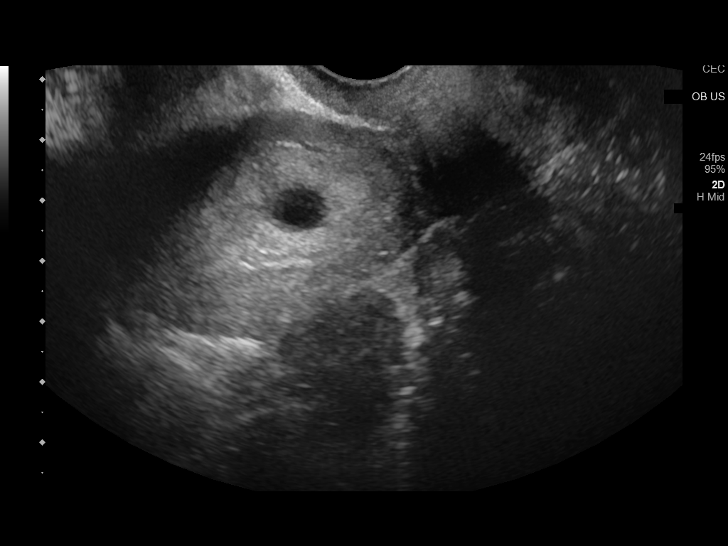
[im 50/72]
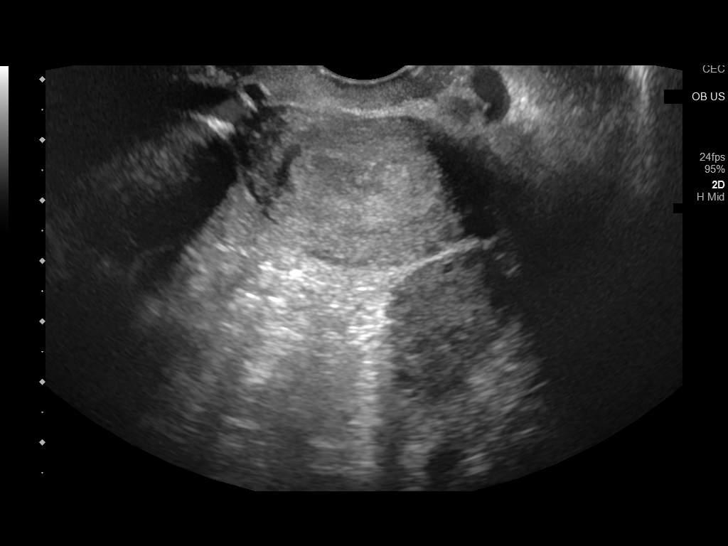
[im 56/72]
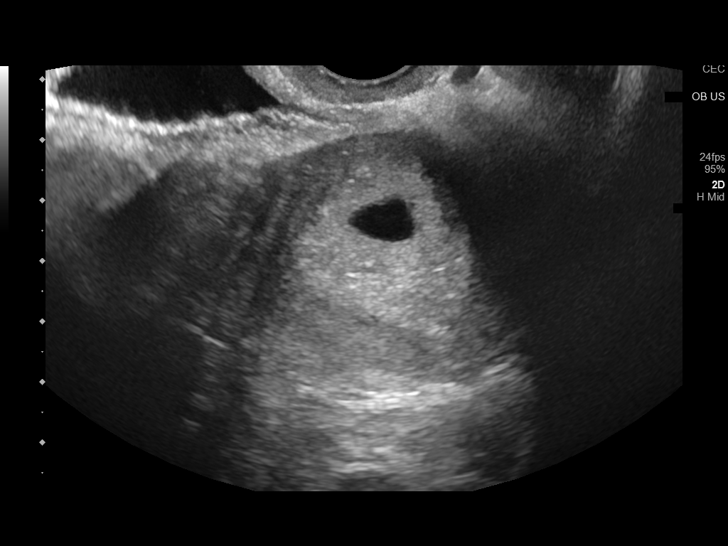
[im 61/72]
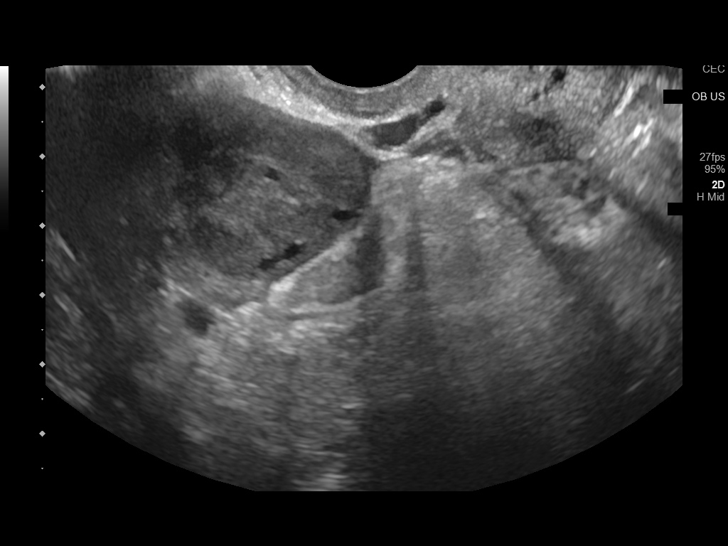
[im 66/72]
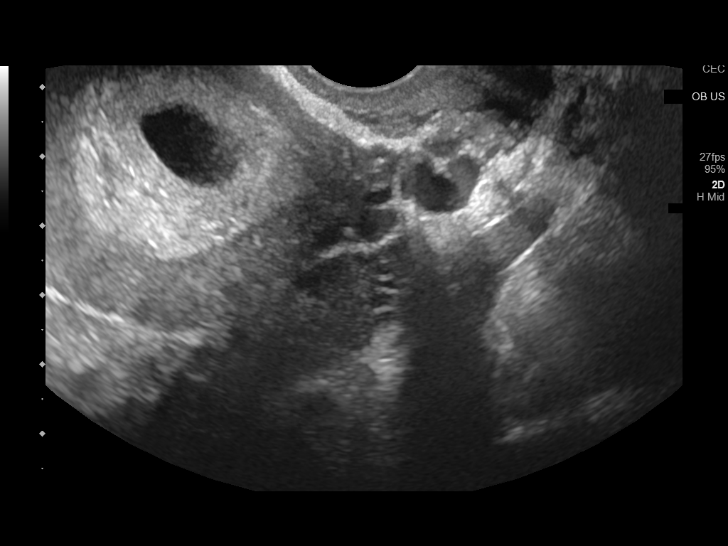
[im 72/72]
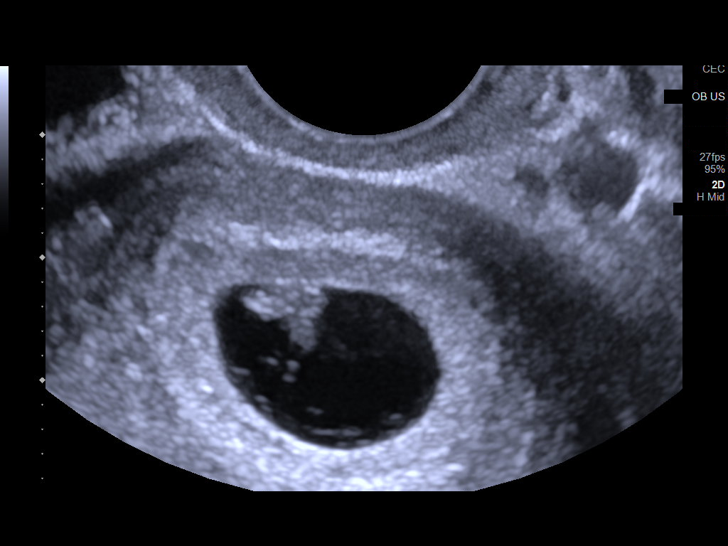

[14 of 28 positions shown; findings below may reference images not displayed]

FINDINGS: Intrauterine gestational sac: Single

Yolk sac:  Visualized.

Embryo:  Visualized.

Cardiac Activity: Visualized.

Heart Rate: 122 bpm

CRL:  7.3 mm   6 w   4 d                  US EDC: 08/29/2021

Subchorionic hemorrhage:  None visualized.

Maternal uterus/adnexae: Uterus measures 8.7 x 4.4 x 5.4 cm. No
uterine masses. Cervix is closed, measuring 3.4 cm in length. Right
ovary measures 3.4 x 2.4 x 3.3 cm and the left ovary measures 3.1 x
1.9 x 3.2 cm. Trace pelvic free fluid adjacent to the left ovary.
IMPRESSION: 1. Single live intrauterine pregnancy as above estimated age 6 weeks
and 4 days.
2. Trace pelvic free fluid, likely physiologic.

## 2021-12-23 IMAGING — CR DG CHEST 2V
1 series · 2 of 2 positions shown · non-contrast
Comparison: Portable chest 07/30/2012.

CLINICAL DATA: 30-year-old female with acute chest pain, shortness
of breath and weakness in the 1st trimester of pregnancy.

EXAM:
CHEST - 2 VIEW

[Series 1: dg chest 2 view · 0.14mm/px · 2 of 2 slices shown]
[im 1/2]
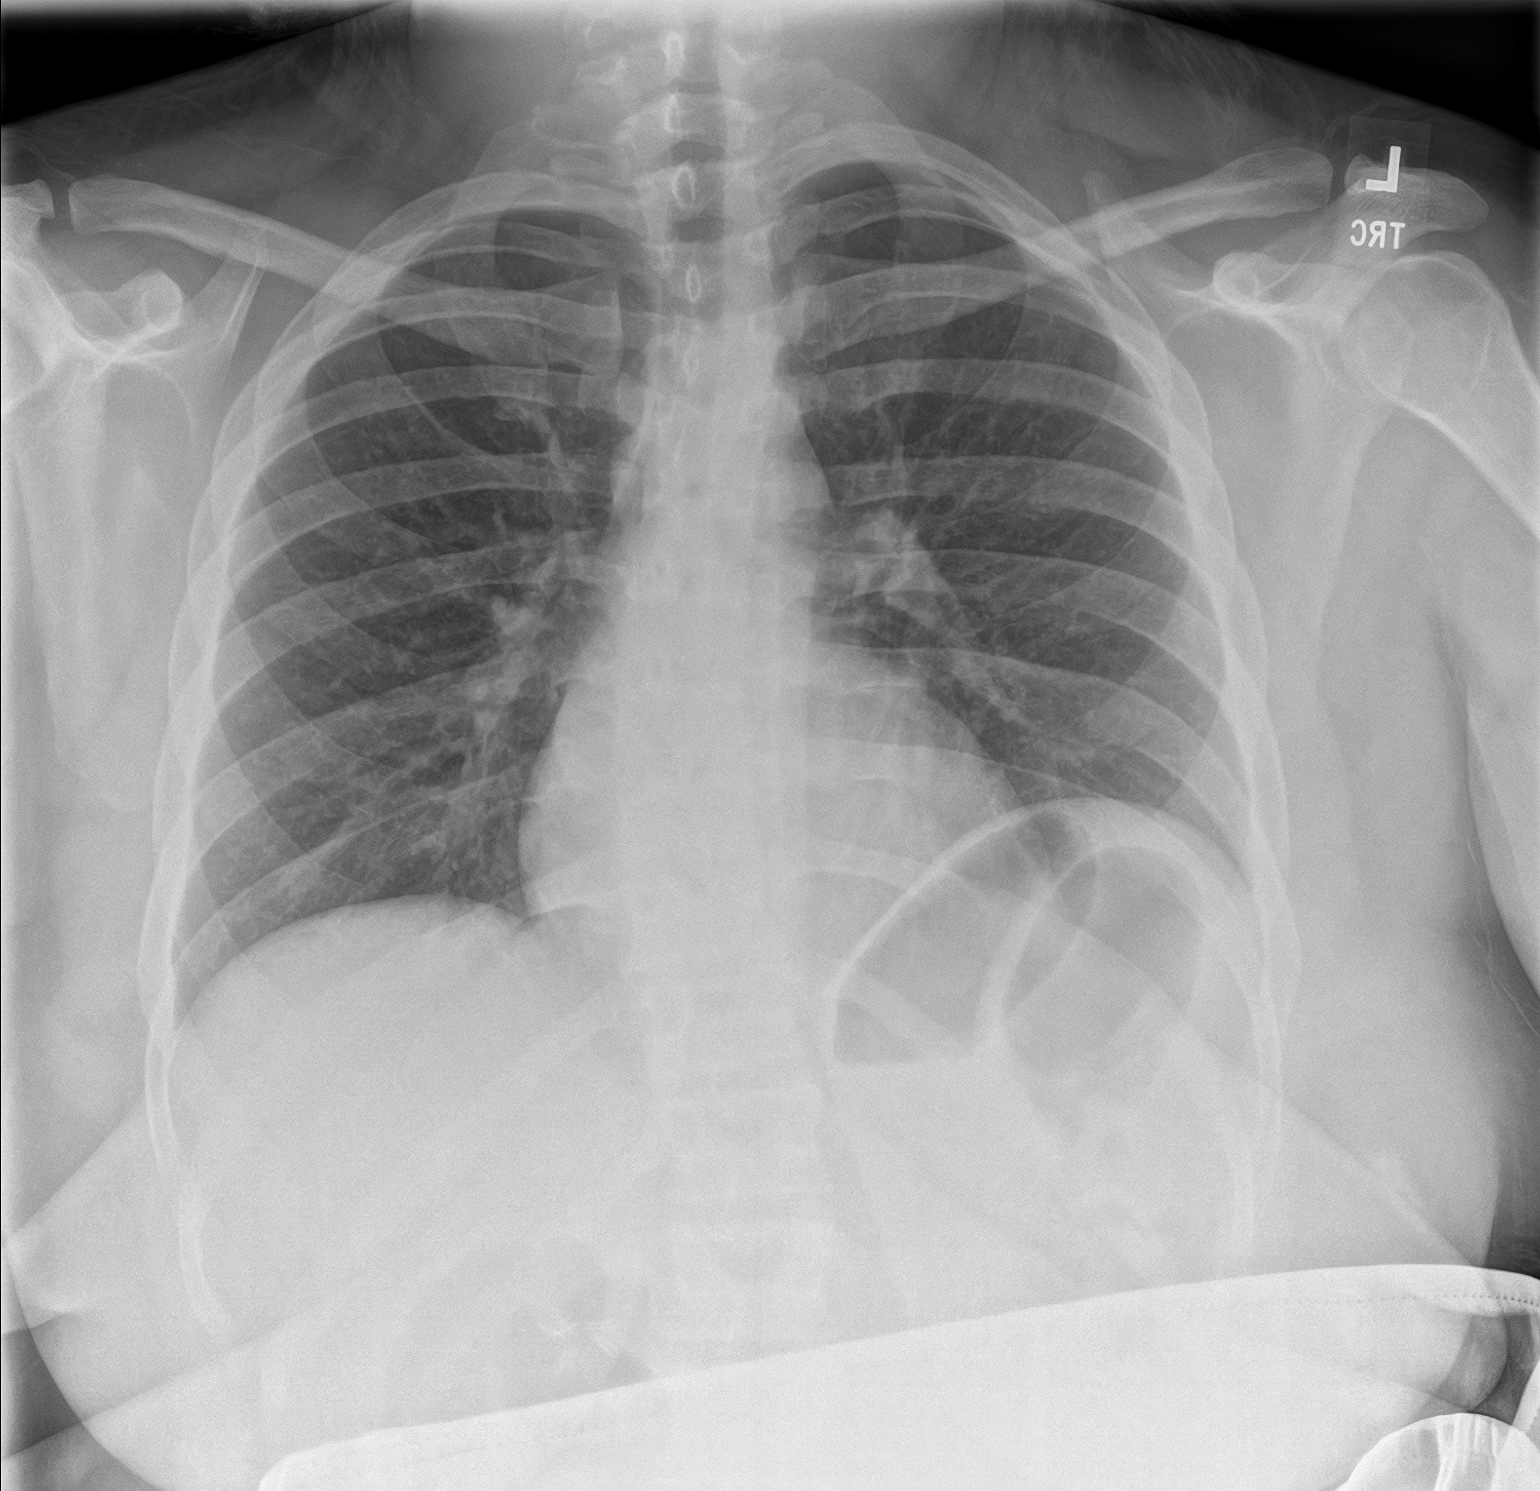
[im 2/2]
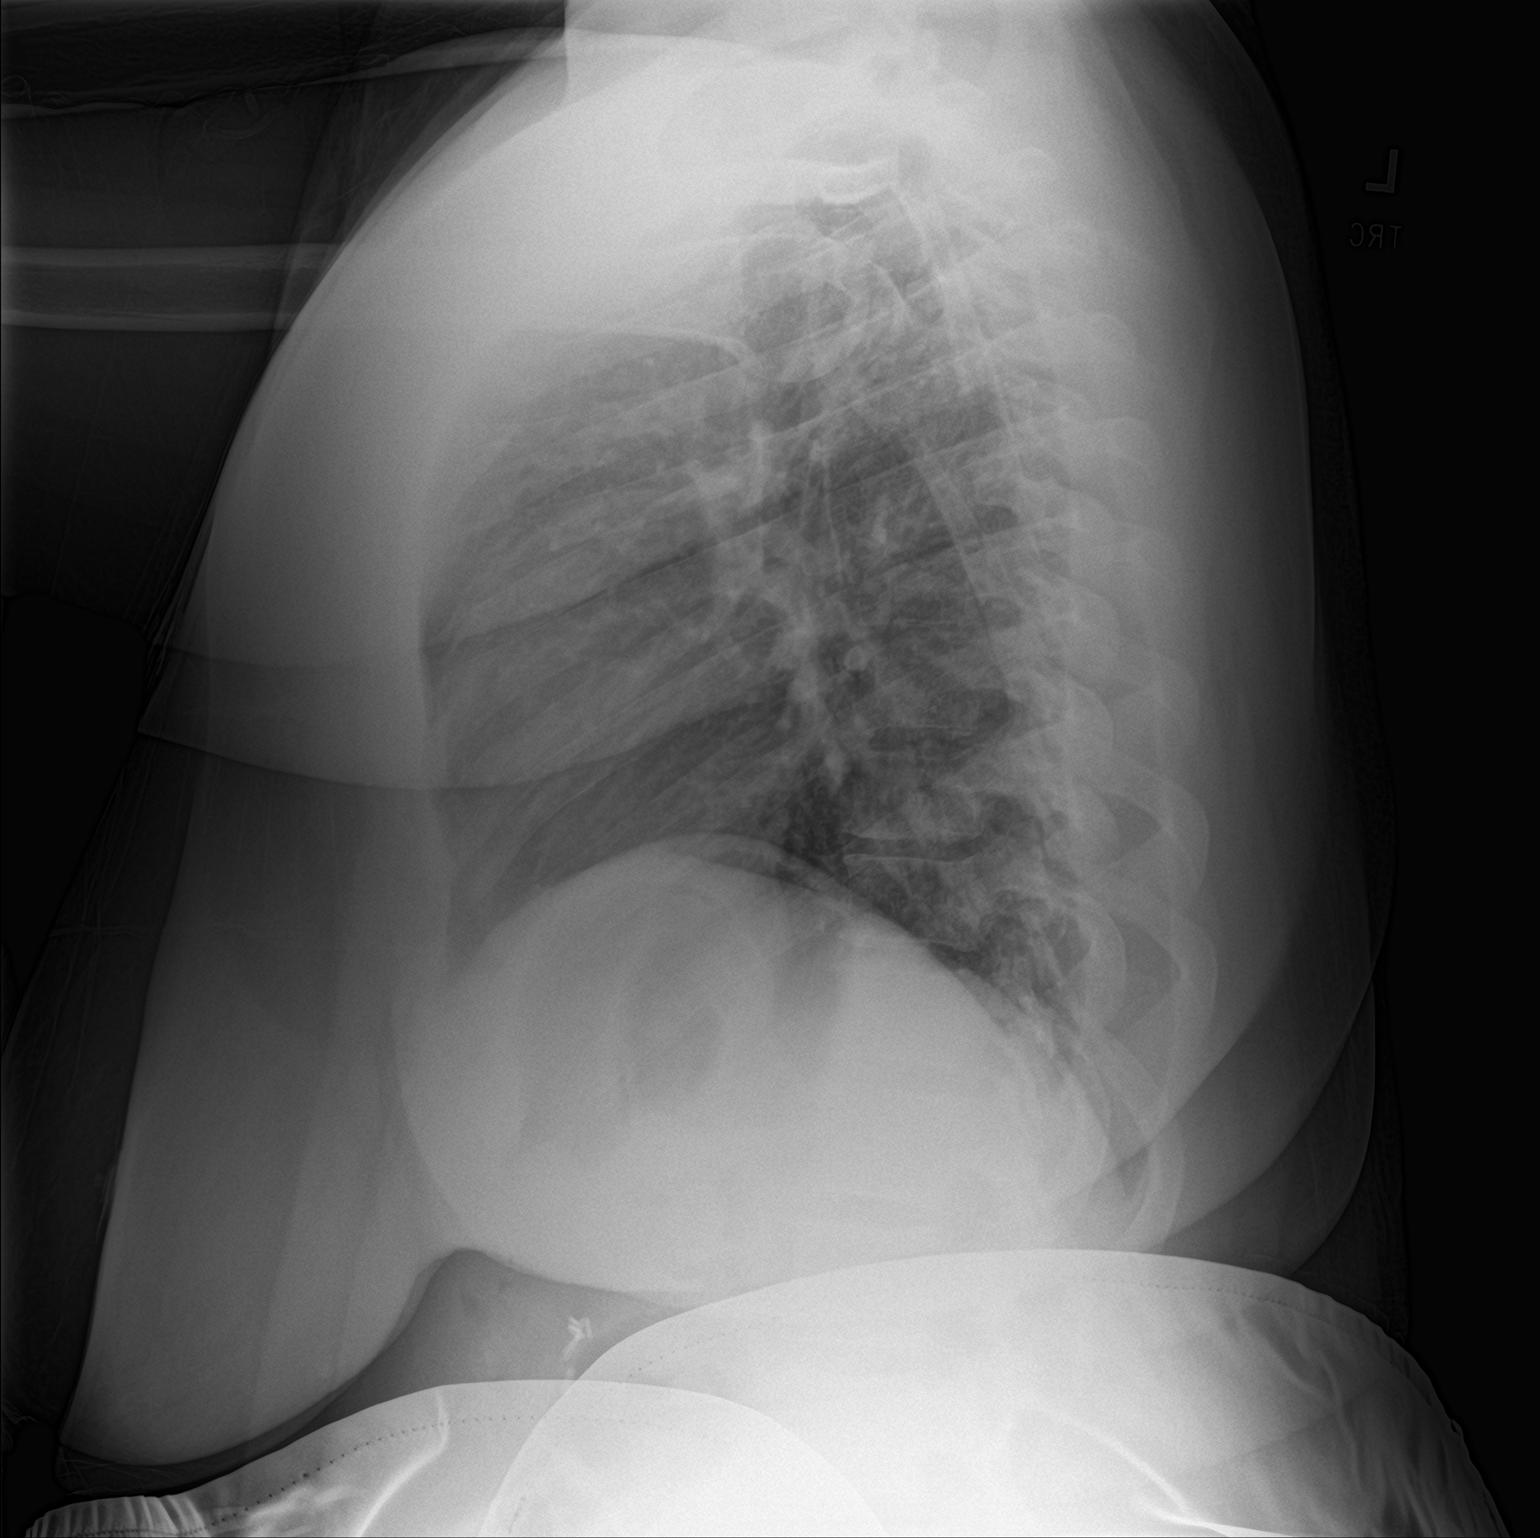

[2 of 2 positions shown; findings below may reference images not displayed]

FINDINGS: PA and lateral views. Low normal lung volumes. Normal cardiac size
and mediastinal contours. Visualized tracheal air column is within
normal limits. Both lungs appear clear. No pneumothorax or pleural
effusion. Cholecystectomy clips in the abdomen. The patient's
abdomen is shielded. Negative visible bowel gas. No osseous
abnormality identified.
IMPRESSION: No cardiopulmonary abnormality.

## 2022-01-10 ENCOUNTER — Emergency Department
Admission: EM | Admit: 2022-01-10 | Discharge: 2022-01-10 | Disposition: A | Discharge status: Home / Self Care | Payer: Medicaid Other | Attending: Student in an Organized Health Care Education/Training Program | Admitting: Student in an Organized Health Care Education/Training Program

## 2022-01-10 ENCOUNTER — Emergency Department: Payer: Medicaid Other

## 2022-01-10 ENCOUNTER — Other Ambulatory Visit: Payer: Self-pay

## 2022-01-10 DIAGNOSIS — Y92410 Unspecified street and highway as the place of occurrence of the external cause: Secondary | ICD-10-CM | POA: Diagnosis not present

## 2022-01-10 DIAGNOSIS — M25561 Pain in right knee: Secondary | ICD-10-CM | POA: Insufficient documentation

## 2022-01-10 MED ORDER — CYCLOBENZAPRINE HCL 10 MG PO TABS: 10.0000 mg | ORAL_TABLET | Freq: Three times a day (TID) | ORAL | 0 refills | Status: AC | PRN

## 2022-01-10 MED ORDER — MELOXICAM 15 MG PO TABS: 15.0000 mg | ORAL_TABLET | Freq: Every day | ORAL | 0 refills | Status: AC

## 2022-01-10 MED ORDER — OXYCODONE-ACETAMINOPHEN 5-325 MG PO TABS
1.0000 | ORAL_TABLET | Freq: Once | ORAL | Status: AC
  Administered 2022-01-10: 1 via ORAL
  Filled 2022-01-10: qty 1
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
## 2022-01-10 NOTE — Discharge Instructions (Addendum)
-  Imitate the meloxicam as needed for pain.  You may additionally take acetaminophen.  -You may take cyclobenzaprine as well for muscle relaxation, though use caution as it may make you dizzy/drowsy.  If it makes you too drowsy during the day, you can opt to just taking it before bed at night.  -If you continue to have knee pain after a couple weeks, you can follow-up with the orthopedist listed in these instructions.  -Return to the emergency department anytime if you begin to experience any new or worsening symptoms.

## 2022-05-16 ENCOUNTER — Ambulatory Visit
Admission: RE | Admit: 2022-05-16 | Discharge: 2022-05-16 | Disposition: A | Discharge status: Home / Self Care | Payer: Medicaid Other | Source: Ambulatory Visit | Attending: General Practice | Admitting: General Practice

## 2022-05-16 ENCOUNTER — Other Ambulatory Visit: Payer: Self-pay | Admitting: General Practice

## 2022-05-16 DIAGNOSIS — M25571 Pain in right ankle and joints of right foot: Secondary | ICD-10-CM

## 2022-05-16 DIAGNOSIS — M25572 Pain in left ankle and joints of left foot: Secondary | ICD-10-CM | POA: Insufficient documentation

## 2022-05-16 DIAGNOSIS — M79672 Pain in left foot: Secondary | ICD-10-CM

## 2022-05-16 DIAGNOSIS — M79671 Pain in right foot: Secondary | ICD-10-CM | POA: Insufficient documentation

## 2022-05-27 ENCOUNTER — Emergency Department
Admission: EM | Admit: 2022-05-27 | Discharge: 2022-05-27 | Disposition: A | Discharge status: Home / Self Care | Payer: Medicaid Other | Attending: Emergency Medicine | Admitting: Emergency Medicine

## 2022-05-27 ENCOUNTER — Other Ambulatory Visit: Payer: Self-pay

## 2022-05-27 ENCOUNTER — Encounter: Payer: Self-pay | Admitting: Emergency Medicine

## 2022-05-27 DIAGNOSIS — G43009 Migraine without aura, not intractable, without status migrainosus: Secondary | ICD-10-CM | POA: Diagnosis not present

## 2022-05-27 DIAGNOSIS — R519 Headache, unspecified: Secondary | ICD-10-CM | POA: Diagnosis present

## 2022-05-27 LAB — CBC
HCT: 37.8 % (ref 36.0–46.0)
Hemoglobin: 12.6 g/dL (ref 12.0–15.0)
MCH: 29.4 pg (ref 26.0–34.0)
MCHC: 33.3 g/dL (ref 30.0–36.0)
MCV: 88.1 fL (ref 80.0–100.0)
Platelets: 281 10*3/uL (ref 150–400)
RBC: 4.29 MIL/uL (ref 3.87–5.11)
RDW: 13.2 % (ref 11.5–15.5)
WBC: 11.1 10*3/uL — ABNORMAL HIGH (ref 4.0–10.5)
nRBC: 0 % (ref 0.0–0.2)

## 2022-05-27 LAB — COMPREHENSIVE METABOLIC PANEL
ALT: 16 U/L (ref 0–44)
AST: 25 U/L (ref 15–41)
Albumin: 3.8 g/dL (ref 3.5–5.0)
Alkaline Phosphatase: 102 U/L (ref 38–126)
Anion gap: 7 (ref 5–15)
BUN: 15 mg/dL (ref 6–20)
CO2: 24 mmol/L (ref 22–32)
Calcium: 8.8 mg/dL — ABNORMAL LOW (ref 8.9–10.3)
Chloride: 105 mmol/L (ref 98–111)
Creatinine, Ser: 0.94 mg/dL (ref 0.44–1.00)
GFR, Estimated: >60 mL/min (ref >60)
Glucose, Bld: 102 mg/dL — ABNORMAL HIGH (ref 70–99)
Potassium: 3.7 mmol/L (ref 3.5–5.1)
Sodium: 136 mmol/L (ref 135–145)
Total Bilirubin: 0.5 mg/dL (ref 0.3–1.2)
Total Protein: 7.7 g/dL (ref 6.5–8.1)

## 2022-05-27 LAB — URINALYSIS, ROUTINE W REFLEX MICROSCOPIC
Bilirubin Urine: NEGATIVE
Glucose, UA: NEGATIVE mg/dL
Hgb urine dipstick: NEGATIVE
Ketones, ur: NEGATIVE mg/dL
Leukocytes,Ua: NEGATIVE
Nitrite: NEGATIVE
Protein, ur: NEGATIVE mg/dL
Specific Gravity, Urine: 1.026 (ref 1.005–1.030)
pH: 5 (ref 5.0–8.0)

## 2022-05-27 LAB — POC URINE PREG, ED: Preg Test, Ur: NEGATIVE

## 2022-05-27 MED ORDER — METOCLOPRAMIDE HCL 5 MG/ML IJ SOLN: 10.0000 mg | Freq: Once | INTRAMUSCULAR | Status: DC

## 2022-05-27 MED ORDER — SODIUM CHLORIDE 0.9 % IV BOLUS: 1000.0000 mL | Freq: Once | INTRAVENOUS | Status: DC

## 2022-05-27 MED ORDER — DIPHENHYDRAMINE HCL 50 MG/ML IJ SOLN: 50.0000 mg | Freq: Once | INTRAMUSCULAR | Status: DC

## 2022-05-27 MED ORDER — KETOROLAC TROMETHAMINE 15 MG/ML IJ SOLN: 15.0000 mg | Freq: Once | INTRAMUSCULAR | Status: DC

## 2022-05-27 NOTE — ED Triage Notes (Signed)
Pt in via POV, reports ongoing intermittent headache w/ nausea x a "couple of days."  Reports some cramping, last normal BM today.  Ambulatory to triage, NAD noted at this time.

## 2022-05-27 NOTE — ED Provider Notes (Signed)
[facility] Provider Note  Patient Contact: 4:23 PM (approximate)   History   Nausea and Headache   HPI  CLORINDA OZOG is a 32 y.o. female with an unremarkable past medical history, presents to the emergency department with a headache the patient described as being around her temples and at the top of her head.  Patient states that her headache seems to come and go and has lasted for several days.  Patient states that her headache comes on slowly and developed in intensity gradually.  She states that she had similar headaches in childhood but they went away.  She states that she had her first child last year and is concerned about possible pregnancy.  No new medications and patient has not recently stopped caffeine.  She states that she did have a fall about a week ago but has had no recent falls or traumas.  She states that her fall 1 week ago did not involve head injury.  She currently rates her headache at 2 out of 10 in intensity and states that it is relieved with Tylenol.      Physical Exam   Triage Vital Signs: ED Triage Vitals  Enc Vitals Group     BP 05/27/22 1516 128/84     Pulse Rate 05/27/22 1516 85     Resp 05/27/22 1516 17     Temp 05/27/22 1516 97.6 F (36.4 C)     Temp Source 05/27/22 1516 Oral     SpO2 05/27/22 1516 99 %     Weight 05/27/22 1517 (!) 319 lb 10.7 oz (145 kg)     Height 05/27/22 1517 5\' 9"  (1.753 m)     Head Circumference --      Peak Flow --      Pain Score 05/27/22 1516 3     Pain Loc --      Pain Edu? --      Excl. in GC? --     Most recent vital signs: Vitals:   05/27/22 1516  BP: 128/84  Pulse: 85  Resp: 17  Temp: 97.6 F (36.4 C)  SpO2: 99%     General: Alert and in no acute distress. Eyes:  PERRL. EOMI. Head: No acute traumatic findings ENT:      Nose: No congestion/rhinnorhea.      Mouth/Throat: Mucous membranes are moist.  Neck: No stridor. No cervical spine tenderness to  palpation. Cardiovascular:  Good peripheral perfusion Respiratory: Normal respiratory effort without tachypnea or retractions. Lungs CTAB. Good air entry to the bases with no decreased or absent breath sounds. Gastrointestinal: Bowel sounds 4 quadrants. Soft and nontender to palpation. No guarding or rigidity. No palpable masses. No distention. No CVA tenderness. Musculoskeletal: Full range of motion to all extremities.  Neurologic:  No gross focal neurologic deficits are appreciated.  Skin:   No rash noted    ED Results / Procedures / Treatments   Labs (all labs ordered are listed, but only abnormal results are displayed) Labs Reviewed  COMPREHENSIVE METABOLIC PANEL - Abnormal; Notable for the following components:      Result Value   Glucose, Bld 102 (*)    Calcium 8.8 (*)    All other components within normal limits  CBC - Abnormal; Notable for the following components:   WBC 11.1 (*)    All other components within normal limits  URINALYSIS, ROUTINE W REFLEX MICROSCOPIC - Abnormal; Notable for the following components:   Color, Urine YELLOW (*)  APPearance HAZY (*)    All other components within normal limits  POC URINE PREG, ED       PROCEDURES:  Critical Care performed: No  Procedures   MEDICATIONS ORDERED IN ED: Medications - No data to display   IMPRESSION / MDM / ASSESSMENT AND PLAN / ED COURSE  I reviewed the triage vital signs and the nursing notes.                              Assessment and plan Headache:  32 year old female presents to the emergency department with headache that is occurred intermittently for the past week.  Vital signs are reassuring at triage.  On exam, patient was alert, active and nontoxic-appearing with no apparent neurodeficits on exam.  CBC, CMP and urinalysis reassuring.  Will administer Benadryl, Reglan, Toradol and normal saline bolus and will reassess.  Patient reported that she had a family emergency and had to  leave the emergency department before receiving aforementioned medications.  She rated her headache intensity is 2 out of 10 prior to discharge.  FINAL CLINICAL IMPRESSION(S) / ED DIAGNOSES   Final diagnoses:  Migraine without aura and without status migrainosus, not intractable     Rx / DC Orders   ED Discharge Orders     None        Note:  This document was prepared using Dragon voice recognition software and may include unintentional dictation errors.   Pia Mau Dillon, PA-C 05/27/22 2026    Merwyn Katos, MD 05/29/22 [phone]

## 2022-05-28 ENCOUNTER — Encounter: Payer: Self-pay | Admitting: Emergency Medicine

## 2022-05-28 ENCOUNTER — Emergency Department
Admission: EM | Admit: 2022-05-28 | Discharge: 2022-05-29 | Disposition: A | Discharge status: Home / Self Care | Payer: Medicaid Other | Attending: Emergency Medicine | Admitting: Emergency Medicine

## 2022-05-28 ENCOUNTER — Other Ambulatory Visit: Payer: Self-pay

## 2022-05-28 DIAGNOSIS — T40711A Poisoning by cannabis, accidental (unintentional), initial encounter: Secondary | ICD-10-CM | POA: Insufficient documentation

## 2022-05-28 DIAGNOSIS — D72829 Elevated white blood cell count, unspecified: Secondary | ICD-10-CM | POA: Diagnosis not present

## 2022-05-28 DIAGNOSIS — E876 Hypokalemia: Secondary | ICD-10-CM | POA: Insufficient documentation

## 2022-05-28 DIAGNOSIS — R000 Tachycardia, unspecified: Secondary | ICD-10-CM | POA: Insufficient documentation

## 2022-05-28 DIAGNOSIS — T50901A Poisoning by unspecified drugs, medicaments and biological substances, accidental (unintentional), initial encounter: Secondary | ICD-10-CM

## 2022-05-28 LAB — CBC
HCT: 40.8 % (ref 36.0–46.0)
Hemoglobin: 13.5 g/dL (ref 12.0–15.0)
MCH: 29.1 pg (ref 26.0–34.0)
MCHC: 33.1 g/dL (ref 30.0–36.0)
MCV: 87.9 fL (ref 80.0–100.0)
Platelets: 346 10*3/uL (ref 150–400)
RBC: 4.64 MIL/uL (ref 3.87–5.11)
RDW: 13.2 % (ref 11.5–15.5)
WBC: 16.5 10*3/uL — ABNORMAL HIGH (ref 4.0–10.5)
nRBC: 0 % (ref 0.0–0.2)

## 2022-05-28 LAB — COMPREHENSIVE METABOLIC PANEL
ALT: 15 U/L (ref 0–44)
AST: 29 U/L (ref 15–41)
Albumin: 3.9 g/dL (ref 3.5–5.0)
Alkaline Phosphatase: 100 U/L (ref 38–126)
Anion gap: 10 (ref 5–15)
BUN: 14 mg/dL (ref 6–20)
CO2: 21 mmol/L — ABNORMAL LOW (ref 22–32)
Calcium: 9 mg/dL (ref 8.9–10.3)
Chloride: 104 mmol/L (ref 98–111)
Creatinine, Ser: 1.09 mg/dL — ABNORMAL HIGH (ref 0.44–1.00)
GFR, Estimated: >60 mL/min (ref >60)
Glucose, Bld: 186 mg/dL — ABNORMAL HIGH (ref 70–99)
Potassium: 2.7 mmol/L — CL (ref 3.5–5.1)
Sodium: 135 mmol/L (ref 135–145)
Total Bilirubin: 0.6 mg/dL (ref 0.3–1.2)
Total Protein: 8.2 g/dL — ABNORMAL HIGH (ref 6.5–8.1)

## 2022-05-28 LAB — ETHANOL: Alcohol, Ethyl (B): <10 mg/dL (ref <10)

## 2022-05-28 MED ORDER — ONDANSETRON HCL 4 MG/2ML IJ SOLN
4.0000 mg | Freq: Once | INTRAMUSCULAR | Status: AC
  Administered 2022-05-28: 4 mg via INTRAVENOUS
  Filled 2022-05-28: qty 2

## 2022-05-28 MED ORDER — POTASSIUM CHLORIDE 10 MEQ/100ML IV SOLN
10.0000 meq | Freq: Once | INTRAVENOUS | Status: AC
  Administered 2022-05-28: 10 meq via INTRAVENOUS
  Filled 2022-05-28 (×2): qty 100

## 2022-05-28 MED ORDER — POTASSIUM CHLORIDE ER 10 MEQ PO TBCR: 10.0000 meq | EXTENDED_RELEASE_TABLET | Freq: Two times a day (BID) | ORAL | 0 refills | Status: DC

## 2022-05-28 MED ORDER — SODIUM CHLORIDE 0.9 % IV BOLUS
1000.0000 mL | Freq: Once | INTRAVENOUS | Status: AC
  Administered 2022-05-28: 1000 mL via INTRAVENOUS

## 2022-05-28 MED ORDER — POTASSIUM CHLORIDE 10 MEQ/100ML IV SOLN
10.0000 meq | Freq: Once | INTRAVENOUS | Status: AC
  Administered 2022-05-28: 10 meq via INTRAVENOUS
  Filled 2022-05-28: qty 100

## 2022-05-28 MED ORDER — LORAZEPAM 2 MG/ML IJ SOLN
1.0000 mg | Freq: Once | INTRAMUSCULAR | Status: AC
  Administered 2022-05-28: 1 mg via INTRAVENOUS
  Filled 2022-05-28: qty 1

## 2022-05-28 NOTE — ED Triage Notes (Signed)
Patient to ED via [facility] for ingestion. Patient ate 4 delta 8 gummies PTA. Patient states she feels off. Aox4. NAD noted.

## 2022-05-28 NOTE — ED Notes (Signed)
Pt still sleeping. Boyfriend at bedside

## 2022-05-28 NOTE — ED Provider Notes (Signed)
[facility] Provider Note    Event Date/Time   First MD Initiated Contact with Patient 05/28/22 1844     (approximate)   History   Ingestion   HPI  Katie Roberson is a 32 y.o. female with a history of obesity, PCOS, and anxiety who presents after taking several Delta 8 THC gummies, stating she feels "off" and like her heart is racing.  The patient states that she took 3 or 4 gummies probably around 4 or 5 PM.  She has never taken Delta 8 before.  The patient denies any other drug or alcohol use today.  She denies any intention of wanting to harm herself.  She denies any acute pain.  I reviewed the past medical records.  The patient was just seen in the ED yesterday with a headache.  Labs were unremarkable but she had to leave the ED before receiving any treatment.  The patient's other most recent medical encounters were last fall and related to giving birth.   Physical Exam   Triage Vital Signs: ED Triage Vitals  Enc Vitals Group     BP 05/28/22 1810 (!) 155/86     Pulse Rate 05/28/22 1810 (!) 142     Resp 05/28/22 1810 18     Temp 05/28/22 1810 98.2 F (36.8 C)     Temp Source 05/28/22 1810 Oral     SpO2 05/28/22 1810 99 %     Weight --      Height --      Head Circumference --      Peak Flow --      Pain Score 05/28/22 1808 0     Pain Loc --      Pain Edu? --      Excl. in GC? --     Most recent vital signs: Vitals:   05/28/22 2255 05/28/22 2305  BP:  100/65  Pulse: 81 81  Resp: 15 20  Temp:    SpO2: 93% 96%     General: Alert and oriented, anxious and restless appearing. CV:  Good peripheral perfusion.  Tachycardic, regular rhythm. Resp:  Normal effort.  Abd:  No distention.  Other:  EOMI.  PERRLA.  Motor intact in all extremities.  Normal coordination.  Dry mucous membranes.   ED Results / Procedures / Treatments   Labs (all labs ordered are listed, but only abnormal results are displayed) Labs Reviewed  COMPREHENSIVE  METABOLIC PANEL - Abnormal; Notable for the following components:      Result Value   Potassium 2.7 (*)    CO2 21 (*)    Glucose, Bld 186 (*)    Creatinine, Ser 1.09 (*)    Total Protein 8.2 (*)    All other components within normal limits  CBC - Abnormal; Notable for the following components:   WBC 16.5 (*)    All other components within normal limits  ETHANOL  URINE DRUG SCREEN, QUALITATIVE ([facility] ONLY)  POC URINE PREG, ED     EKG  ED ECG REPORT I, Dionne Bucy, the attending physician, personally viewed and interpreted this ECG.  Date: 05/28/2022 EKG Time: 1927 Rate: 110 Rhythm: Sinus tachycardia QRS Axis: normal Intervals: normal ST/T Wave abnormalities: normal Narrative Interpretation: no evidence of acute ischemia    RADIOLOGY   PROCEDURES:  Critical Care performed: No  Procedures   MEDICATIONS ORDERED IN ED: Medications  LORazepam (ATIVAN) injection 1 mg (1 mg Intravenous Given 05/28/22 1934)  potassium chloride 10  mEq in 100 mL IVPB (0 mEq Intravenous Stopped 05/28/22 2214)  potassium chloride 10 mEq in 100 mL IVPB (0 mEq Intravenous Stopped 05/28/22 2041)  sodium chloride 0.9 % bolus 1,000 mL (0 mLs Intravenous Stopped 05/28/22 2214)  ondansetron (ZOFRAN) injection 4 mg (4 mg Intravenous Given 05/28/22 1938)     IMPRESSION / MDM / ASSESSMENT AND PLAN / ED COURSE  I reviewed the triage vital signs and the nursing notes.  32 year old female with no active medical problems presents with her heart racing and feeling "off" after taking 3-4 delta 8 THC Gummies a few hours ago.  On exam the patient is tachycardic, anxious appearing, and somewhat restless, but is oriented.  She appears intoxicated in a manner consistent with THC.  Neuro exam is nonfocal.    Initial labs reveal hypokalemia but otherwise normal electrolytes.  WBC count is mildly elevated which is likely reactive.  Differential diagnosis includes, but is not limited to, unintentional drug  overdose.  There is no evidence of primary CNS cause.  I have ordered Ativan and fluids.  I have also ordered potassium repletion.  We will observe the patient for the next several hours to sobriety.  Patient's presentation is most consistent with acute complicated illness / injury requiring diagnostic workup.  The patient is on the cardiac monitor to evaluate for evidence of arrhythmia and/or significant heart rate changes.  ----------------------------------------- 11:28 PM on 05/28/2022 -----------------------------------------  The patient is still sleepy but is arousable.  Her boyfriend is here now and states that she was talking to him.  She has had a few low blood pressure readings although these are mostly when her arm is off of the side of the bed.  When stimulated and put into an appropriate position, her blood pressure is normal.  We will continue to observe to sobriety.  I have signed her out to the oncoming ED physician Dr. Larinda Buttery.   FINAL CLINICAL IMPRESSION(S) / ED DIAGNOSES   Final diagnoses:  Accidental drug overdose, initial encounter  Hypokalemia     Rx / DC Orders   ED Discharge Orders          Ordered    potassium chloride (KLOR-CON) 10 MEQ tablet  2 times daily        05/28/22 2315             Note:  This document was prepared using Dragon voice recognition software and may include unintentional dictation errors.    Dionne Bucy, MD 05/28/22 2329

## 2022-05-28 NOTE — ED Notes (Signed)
UA when able.  Pt still resting comfortably.  RN observed symmetrical rise and fall of chest without labored breathing.  No signs of respiratory distress noted

## 2022-05-28 NOTE — ED Notes (Signed)
Pts significant other to desk- states that pt feels like her heart is racing. VS assessed, pt does not appear in any distress. No change from initial triage.

## 2022-05-28 NOTE — ED Notes (Signed)
Pt reports ingesting 4 delta 8 gummies of unknown strength. Pt repeatedly asking if she will be ok. Pt reassured. Pt presents with slurred speech, but no other deficits or abnormalities.

## 2022-05-28 NOTE — ED Notes (Signed)
UA when able 

## 2022-05-28 NOTE — ED Notes (Signed)
CRITICAL VALUE STICKER  CRITICAL VALUE: K+ 2.7  DATE & TIME NOTIFIED: 1843  MD NOTIFIED: Siadecki  TIME OF NOTIFICATION:1845  RESPONSE: verbal acknowledgement

## 2022-05-28 NOTE — ED Notes (Signed)
ED Provider at bedside. 

## 2022-05-28 NOTE — ED Provider Notes (Signed)
-----------------------------------------   11:08 PM on 05/28/2022 -----------------------------------------  Blood pressure (!) 96/54, pulse 77, temperature 97.8 F (36.6 C), temperature source Oral, resp. rate 15, last menstrual period 05/09/2022, SpO2 95 %, unknown if currently breastfeeding.  Assuming care from Dr. Marisa Severin.  In short, Katie Roberson is a 32 y.o. female with a chief complaint of Ingestion .  Refer to the original H&P for additional details.  The current plan of care is to reassess once clinically sober after ingesting multiple delta 8 gummies.  ----------------------------------------- 7:11 AM on 05/29/2022 ----------------------------------------- Patient now awake and alert, appears clinically sober.  She is tolerating oral intake without difficulty and is appropriate for discharge home with follow-up as needed.  Has been counseled to have her return to the ED for new or worsening symptoms, husband agrees with plan.    Chesley Noon, MD 05/29/22 [phone]

## 2022-05-29 LAB — URINE DRUG SCREEN, QUALITATIVE (ARMC ONLY)
Amphetamines, Ur Screen: NOT DETECTED
Barbiturates, Ur Screen: NOT DETECTED
Benzodiazepine, Ur Scrn: NOT DETECTED
Cannabinoid 50 Ng, Ur ~~LOC~~: NOT DETECTED
Cocaine Metabolite,Ur ~~LOC~~: NOT DETECTED
MDMA (Ecstasy)Ur Screen: NOT DETECTED
Methadone Scn, Ur: NOT DETECTED
Opiate, Ur Screen: NOT DETECTED
Phencyclidine (PCP) Ur S: NOT DETECTED
Tricyclic, Ur Screen: NOT DETECTED

## 2022-05-29 LAB — HCG, QUANTITATIVE, PREGNANCY: hCG, Beta Chain, Quant, S: <1 m[IU]/mL (ref <5)

## 2022-05-29 NOTE — ED Notes (Signed)
UA still when able; pt is still lethargic and sleeping from presenting symptoms;  RN observed symmetrical rise and fall of chest without labored breathing. No sign of respiratory distress noted.

## 2022-07-05 ENCOUNTER — Encounter: Payer: Self-pay | Admitting: Obstetrics and Gynecology

## 2022-07-05 ENCOUNTER — Ambulatory Visit (INDEPENDENT_AMBULATORY_CARE_PROVIDER_SITE_OTHER): Payer: Medicaid Other | Admitting: Obstetrics and Gynecology

## 2022-07-05 VITALS — BP 119/78 | HR 81 | Ht 69.0 in | Wt 338.0 lb

## 2022-07-05 DIAGNOSIS — R61 Generalized hyperhidrosis: Secondary | ICD-10-CM

## 2022-07-05 DIAGNOSIS — N898 Other specified noninflammatory disorders of vagina: Secondary | ICD-10-CM

## 2022-07-05 DIAGNOSIS — R232 Flushing: Secondary | ICD-10-CM

## 2022-07-05 DIAGNOSIS — R399 Unspecified symptoms and signs involving the genitourinary system: Secondary | ICD-10-CM | POA: Diagnosis not present

## 2022-07-05 LAB — POCT URINALYSIS DIPSTICK
Bilirubin, UA: NEGATIVE
Blood, UA: NEGATIVE
Glucose, UA: NEGATIVE
Ketones, UA: NEGATIVE
Leukocytes, UA: NEGATIVE
Nitrite, UA: NEGATIVE
Protein, UA: NEGATIVE
Spec Grav, UA: 1.02 (ref 1.010–1.025)
Urobilinogen, UA: 0.2 E.U./dL
pH, UA: 6.5 (ref 5.0–8.0)

## 2022-07-05 MED ORDER — PHEXXI 1.8-1-0.4 % VA GEL
1.0000 | VAGINAL | 3 refills | Status: AC | PRN
Start: 2022-07-05 — End: ?

## 2022-07-05 NOTE — Progress Notes (Signed)
Patient presents today due to ongoing vaginal dryness. She states 2 years of vaginal dryness and pain with intercourse. She states experiencing hot flashes and night sweats as well. Reports UTI concerns.

## 2022-07-05 NOTE — Progress Notes (Signed)
HPI:      [name] is a 32 y.o. G2P0010 who LMP was Patient's last menstrual period was 06/25/2022 (approximate).  Subjective:   She presents today complaining of vaginal dryness ever since she had a BV infection 2 years ago.  She has been washing sometimes up to 4 times a day and she thinks this may have contributed to a change in the vagina.  In addition she has a history of PCO and was placed on OCPs for this.  She states that she does not take her OCPs regularly in fact she has not taken them for 2 weeks. She also complains of occasional hot flashes and night sweats.  She also has stated that she feels like she might have a bladder infection and would like to be tested.    Hx: The following portions of the patient's history were reviewed and updated as appropriate:             She  has a past medical history of Anxiety, Chicken pox, Chronic abdominal pain, Chronic pelvic pain in female, Dysmenorrhea, Gallstones, PCOS (polycystic ovarian syndrome), and Vaginal pain. She does not have any pertinent problems on file. She  has a past surgical history that includes Cholecystectomy (N/A, 02/28/2018). Her family history includes Diabetes in her paternal grandmother. She  reports that she has quit smoking. Her smoking use included cigarettes. She has never used smokeless tobacco. She reports that she does not currently use alcohol. She reports that she does not use drugs. She has a current medication list which includes the following prescription(s): epinephrine, tri-lo-estarylla, famotidine, and potassium chloride. She is allergic to grapefruit concentrate, grapefruit extract, kiwi extract, and soap.       Review of Systems:  Review of Systems  Constitutional: Denied constitutional symptoms, night sweats, recent illness, fatigue, fever, insomnia and weight loss.  Eyes: Denied eye symptoms, eye pain, photophobia, vision change and visual disturbance.  Ears/Nose/Throat/Neck: Denied  ear, nose, throat or neck symptoms, hearing loss, nasal discharge, sinus congestion and sore throat.  Cardiovascular: Denied cardiovascular symptoms, arrhythmia, chest pain/pressure, edema, exercise intolerance, orthopnea and palpitations.  Respiratory: Denied pulmonary symptoms, asthma, pleuritic pain, productive sputum, cough, dyspnea and wheezing.  Gastrointestinal: Denied, gastro-esophageal reflux, melena, nausea and vomiting.  Genitourinary: See HPI for additional information.  Musculoskeletal: Denied musculoskeletal symptoms, stiffness, swelling, muscle weakness and myalgia.  Dermatologic: Denied dermatology symptoms, rash and scar.  Neurologic: Denied neurology symptoms, dizziness, headache, neck pain and syncope.  Psychiatric: Denied psychiatric symptoms, anxiety and depression.  Endocrine: See HPI for additional information.   Meds:   Current Outpatient Medications on File Prior to Visit  Medication Sig Dispense Refill   EPINEPHrine 0.3 mg/0.3 mL IJ SOAJ injection Inject 0.3 mg into the muscle as needed for anaphylaxis. 1 each 1   TRI-LO-ESTARYLLA 0.18/0.215/0.25 MG-25 MCG tab Take 1 tablet by mouth daily.     famotidine (PEPCID) 20 MG tablet Take 1 tablet (20 mg total) by mouth 2 (two) times daily. (Patient not taking: Reported on 05/28/2022) 8 tablet 0   potassium chloride (KLOR-CON) 10 MEQ tablet Take 1 tablet (10 mEq total) by mouth 2 (two) times daily for 5 days. 10 tablet 0   No current facility-administered medications on file prior to visit.      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 Vaginal dryness   2. Hot flashes   3. Night sweat   4. UTI symptoms     No evidence of UTI.  Occasional hot flashes-doubt menopause.  Vaginal dryness patient associates with increased vaginal hygiene.  I doubt that this is the problem.   Plan:            1.  FSH to rule out menopause as a cause for hot flashes and vaginal dryness   2.  Advised patient to discontinue OCPs because of her increased BMI  3.  Phexxi for lubrication and birth control  Orders Orders Placed This Encounter  Procedures   Urine Culture   POCT Urinalysis Dipstick    No orders of the defined types were placed in this encounter.     F/U  No follow-ups on file. I spent 23 minutes involved in the care of this patient preparing to see the patient by obtaining and reviewing her medical history (including labs, imaging tests and prior procedures), documenting clinical information in the electronic health record (EHR), counseling and coordinating care plans, writing and sending prescriptions, ordering tests or procedures and in direct communicating with the patient and medical staff discussing pertinent items from her history and physical exam.  Elonda Husky, M.D. 07/05/2022 3:37 PM

## 2022-07-06 LAB — FOLLICLE STIMULATING HORMONE: FSH: 4.5 m[IU]/mL

## 2022-07-06 NOTE — Telephone Encounter (Signed)
Pt called triage to ask about results. Advised this msg was sent. Pt aware of results.

## 2022-07-08 LAB — URINE CULTURE

## 2022-07-30 ENCOUNTER — Ambulatory Visit: Payer: Medicaid Other

## 2022-07-30 NOTE — Progress Notes (Deleted)
    NURSE VISIT NOTE  Subjective:    Patient ID: Katie Roberson, female    DOB: 1990/06/28, 32 y.o.   MRN: [id number]  HPI  Patient is a 32 y.o. G62P0010 female who presents for {pe vag discharge desc:315065} vaginal discharge for *** {gen duration:315003}. Denies abnormal vaginal bleeding or significant pelvic pain or fever. {Actions; denies/reports/admits to:19208} {UTI Symptoms:210800002}. Patient {has/denies:315300} history of known exposure to STD.   Objective:    LMP 06/25/2022 (Approximate)    @THIS  VISIT ONLY@  Assessment:   No diagnosis found.  {vaginitis type:315262}  Plan:   GC and chlamydia DNA  probe sent to lab. Treatment: {vaginitis tx:315263} ROV prn if symptoms persist or worsen.   Rocco Serene, LPN

## 2022-09-15 ENCOUNTER — Emergency Department
Admission: EM | Admit: 2022-09-15 | Discharge: 2022-09-16 | Disposition: A | Discharge status: Home / Self Care | Payer: Medicaid Other | Attending: Emergency Medicine | Admitting: Emergency Medicine

## 2022-09-15 DIAGNOSIS — L509 Urticaria, unspecified: Secondary | ICD-10-CM | POA: Diagnosis not present

## 2022-09-15 DIAGNOSIS — O219 Vomiting of pregnancy, unspecified: Secondary | ICD-10-CM | POA: Diagnosis present

## 2022-09-15 DIAGNOSIS — O26891 Other specified pregnancy related conditions, first trimester: Secondary | ICD-10-CM | POA: Insufficient documentation

## 2022-09-15 DIAGNOSIS — O99711 Diseases of the skin and subcutaneous tissue complicating pregnancy, first trimester: Secondary | ICD-10-CM | POA: Diagnosis not present

## 2022-09-15 DIAGNOSIS — R102 Pelvic and perineal pain: Secondary | ICD-10-CM | POA: Diagnosis not present

## 2022-09-15 DIAGNOSIS — Z3A01 Less than 8 weeks gestation of pregnancy: Secondary | ICD-10-CM | POA: Insufficient documentation

## 2022-09-15 DIAGNOSIS — Z3490 Encounter for supervision of normal pregnancy, unspecified, unspecified trimester: Secondary | ICD-10-CM

## 2022-09-15 LAB — POC URINE PREG, ED: Preg Test, Ur: POSITIVE — AB

## 2022-09-15 MED ORDER — ONDANSETRON 4 MG PO TBDP
4.0000 mg | ORAL_TABLET | Freq: Once | ORAL | Status: AC
  Administered 2022-09-15: 4 mg via ORAL
  Filled 2022-09-15: qty 1

## 2022-09-15 MED ORDER — DEXAMETHASONE 10 MG/ML FOR PEDIATRIC ORAL USE
10.0000 mg | Freq: Once | INTRAMUSCULAR | Status: AC
  Administered 2022-09-15: 10 mg via ORAL
  Filled 2022-09-15: qty 1

## 2022-09-15 MED ORDER — LORATADINE 10 MG PO TABS
10.0000 mg | ORAL_TABLET | Freq: Once | ORAL | Status: AC
  Administered 2022-09-15: 10 mg via ORAL
  Filled 2022-09-15: qty 1

## 2022-09-15 NOTE — ED Triage Notes (Addendum)
Patient C/O uterine cramping that began today, of note patient is around six weeks pregnant, but has not seen a Dr. Patient states she has also had a minute amount of vaginal spotting. Additionally, patient is C/O body-wide hives that also began this morning. Patient did take Benadryl three hours ago, but with no relief.

## 2022-09-16 ENCOUNTER — Emergency Department: Payer: Medicaid Other

## 2022-09-16 LAB — HCG, QUANTITATIVE, PREGNANCY: hCG, Beta Chain, Quant, S: 46343 m[IU]/mL — ABNORMAL HIGH (ref <5)

## 2022-09-16 MED ORDER — ONDANSETRON 4 MG PO TBDP: ORAL_TABLET | ORAL | 0 refills | Status: DC

## 2022-09-16 NOTE — Discharge Instructions (Signed)
Your workup in the Emergency Department today was reassuring.  We did not find any specific abnormalities.  We recommend you drink plenty of fluids, take your regular medications and/or any new ones prescribed today, and follow up with the doctor(s) listed in these documents as recommended.  Return to the Emergency Department if you develop new or worsening symptoms that concern you.  

## 2022-09-16 NOTE — ED Provider Notes (Signed)
[facility] Provider Note    Event Date/Time   First MD Initiated Contact with Patient 09/15/22 2313     (approximate)   History   Pelvic Pain and Urticaria   HPI Katie Roberson is a 32 y.o. female G3, P1 Ab1 at which she believes is about [redacted] weeks gestation based on last menstrual cycle.  She presents for evaluation of 2 main concerns at this time, acute onset of hives primarily on her arms but also on her neck, as well as acute onset of lower abdominal/pelvic pain.  She reports that she gets hives from time to time and is allergic to several types of food.  She does not think she has been in contact with anything in particular but she broke out in hives consistent with her prior rash.  She said that they are itchy, nonpainful, and did not seem to get better after 1 Benadryl that she took at home.  She is not having any shortness of breath or dizziness/lightheadedness.  She has been having nausea and vomiting since she first became pregnant but that has not changed.  She currently is not having any abdominal pain but said that it comes and goes.  No recent dysuria, no hematuria.  She also reports pain in her abdomen.  She denies having any vaginal spotting or bleeding although she did mention to triage that she thought she did have some spotting.  She is concerned because she has not yet had an ultrasound.  She had a child about a year ago and had her first pregnancy electively aborted.     Physical Exam   Triage Vital Signs: ED Triage Vitals  Encounter Vitals Group     BP 09/15/22 2140 (!) 113/92     Systolic BP Percentile --      Diastolic BP Percentile --      Pulse Rate 09/15/22 2140 81     Resp 09/15/22 2140 18     Temp 09/15/22 2140 98 F (36.7 C)     Temp Source 09/15/22 2140 Oral     SpO2 09/15/22 2140 100 %     Weight 09/15/22 2138 (!) 152 kg (335 lb)     Height 09/15/22 2138 1.753 m (5\' 9" )     Head Circumference --      Peak Flow --       Pain Score --      Pain Loc --      Pain Education --      Exclude from Growth Chart --     Most recent vital signs: Vitals:   09/16/22 0100 09/16/22 0130  BP: 114/71 119/77  Pulse: 67 61  Resp: 16 16  Temp:    SpO2: 100% 100%    General: Awake, no distress.  CV:  Good peripheral perfusion.  Resp:  Normal effort. Speaking easily and comfortably, no accessory muscle usage nor intercostal retractions.   Abd:  No distention.  Obese.  No tenderness to palpation of the lower abdomen. Other:  Patient has urticarial rash primarily on her forearms, with some involvement although very minimal to the area at the bottom of her neck or her upper anterior chest.  No mucosal involvement.  No tenderness to palpation, no erythema, no suggestion of infectious process such as cellulitis nor abscess.   ED Results / Procedures / Treatments   Labs (all labs ordered are listed, but only abnormal results are displayed) Labs Reviewed  HCG, QUANTITATIVE, PREGNANCY -  Abnormal; Notable for the following components:      Result Value   hCG, Beta Chain, Quant, S [id number] (*)    All other components within normal limits  POC URINE PREG, ED - Abnormal; Notable for the following components:   Preg Test, Ur Positive (*)    All other components within normal limits    RADIOLOGY: I viewed and interpreted the patient's OB ultrasound and see a single intrauterine pregnancy.  Radiologist confirms about 7 weeks and 3 days gestation with no obvious acute or emergent abnormalities to the pregnancy.   PROCEDURES:  Critical Care performed: No  Procedures    IMPRESSION / MDM / ASSESSMENT AND PLAN / ED COURSE  I reviewed the triage vital signs and the nursing notes.                              Differential diagnosis includes, but is not limited to, allergic reaction, dermatitis, medication or drug side effect, infectious process, ectopic pregnancy, nonviable pregnancy, pregnancy related  discomfort.  Patient's presentation is most consistent with acute presentation with potential threat to life or bodily function.  Labs/studies ordered: hCG, urine pregnancy test, OB ultrasound  Interventions/Medications given:  Medications  loratadine (CLARITIN) tablet 10 mg (10 mg Oral Given 09/15/22 2356)  dexamethasone (DECADRON) 10 MG/ML injection for Pediatric ORAL use 10 mg (10 mg Oral Given 09/15/22 2356)  ondansetron (ZOFRAN-ODT) disintegrating tablet 4 mg (4 mg Oral Given 09/15/22 2356)    (Note:  hospital course my include additional interventions and/or labs/studies not listed above.)   Reassuring physical exam with no tenderness to palpation of the abdomen.  Patient is using her phone and watching her TV in no obvious distress.  Vital signs are stable and within normal limits.  Lab test consistent with early pregnancy.  I obtained a formal ultrasound to attempt to rule out ectopic pregnancy and the ultrasound was reassuring as documented above  I provided reassurance to the patient.  Medications as listed above for hives which have started to improve although not completely resolved.  I suggest that she take a daily cetirizine and that the Decadron will work over time.  She is comfortable with the plan for discharge and outpatient follow-up to establish prenatal care.  I gave my usual and customary management recommendations and return precautions.       FINAL CLINICAL IMPRESSION(S) / ED DIAGNOSES   Final diagnoses:  Pelvic pain in female  Nausea/vomiting in pregnancy  Hives  Early stage of pregnancy     Rx / DC Orders   ED Discharge Orders     None        Note:  This document was prepared using Dragon voice recognition software and may include unintentional dictation errors.   Loleta Rose, MD 09/16/22 [phone]

## 2022-09-17 ENCOUNTER — Other Ambulatory Visit: Payer: Self-pay

## 2022-09-17 ENCOUNTER — Encounter: Payer: Self-pay | Admitting: *Deleted

## 2022-09-17 ENCOUNTER — Emergency Department
Admission: EM | Admit: 2022-09-17 | Discharge: 2022-09-17 | Disposition: A | Discharge status: Home / Self Care | Payer: Medicaid Other | Attending: Emergency Medicine | Admitting: Emergency Medicine

## 2022-09-17 DIAGNOSIS — O2686 Pruritic urticarial papules and plaques of pregnancy (PUPPP): Secondary | ICD-10-CM | POA: Diagnosis present

## 2022-09-17 DIAGNOSIS — Z3A01 Less than 8 weeks gestation of pregnancy: Secondary | ICD-10-CM | POA: Diagnosis not present

## 2022-09-17 DIAGNOSIS — L509 Urticaria, unspecified: Secondary | ICD-10-CM

## 2022-09-17 NOTE — Discharge Instructions (Signed)
Return to the ER for new or worsening symptoms. °

## 2022-09-17 NOTE — ED Provider Notes (Signed)
[facility] Provider Note    Event Date/Time   First MD Initiated Contact with Patient 09/17/22 0451     (approximate)   History   Allergic Reaction   HPI  Katie Roberson is a 32 y.o. female G3P1 presenting to the ER for persistent hives.  She was seen in the ER yesterday for hives as well as some abdominal pain in pregnancy.  She had an ultrasound confirming an IUP.  She denies any ongoing abdominal symptoms.  She was given Claritin, Decadron for her hives.  She reports that despite this they have not improved.  Does feel that they may be slightly worse around her neck today.  No difficulty breathing, throat swelling, lightheadedness, worsened nausea, vomiting, abdominal pain.  Started using a new detergent about a week ago, cannot identify any other new foods, body products.  Does report a history of hives multiple times in the past.      Physical Exam   Triage Vital Signs: ED Triage Vitals  Encounter Vitals Group     BP 09/17/22 0415 96/78     Systolic BP Percentile --      Diastolic BP Percentile --      Pulse Rate 09/17/22 0415 76     Resp 09/17/22 0415 16     Temp 09/17/22 0415 97.7 F (36.5 C)     Temp Source 09/17/22 0415 Oral     SpO2 09/17/22 0415 100 %     Weight --      Height --      Head Circumference --      Peak Flow --      Pain Score 09/17/22 0416 0     Pain Loc --      Pain Education --      Exclude from Growth Chart --     Most recent vital signs: Vitals:   09/17/22 0415 09/17/22 0454  BP: 96/78 119/74  Pulse: 76 77  Resp: 16 16  Temp: 97.7 F (36.5 C)   SpO2: 100% 100%     General: Awake, interactive  CV:  Regular rate, good peripheral perfusion.  Resp:  Lungs clear, unlabored respirations.  Abd:  Soft, nondistended.  Neuro:  Symmetric facial movement, fluid speech Skin:  Scattered areas of urticaria most prominently over the left anterior neck, few spots over the left posterior arm, posterior  neck   ED Results / Procedures / Treatments   Labs (all labs ordered are listed, but only abnormal results are displayed) Labs Reviewed - No data to display   EKG EKG independently reviewed interpreted by myself (ER attending) demonstrates:    RADIOLOGY Imaging independently reviewed and interpreted by myself demonstrates:    PROCEDURES:  Critical Care performed: No  Procedures   MEDICATIONS ORDERED IN ED: Medications - No data to display   IMPRESSION / MDM / ASSESSMENT AND PLAN / ED COURSE  I reviewed the triage vital signs and the nursing notes.  Differential diagnosis includes, but is not limited to, urticaria secondary to food, environmental, body products, idiopathic, other trigger  Patient's presentation is most consistent with acute, uncomplicated illness.  32 year old female presenting with persistent hives.  No evidence of anaphylaxis.  Discussed that urticaria can sometimes be persistent for days to weeks.  I discussed limited utility of blood testing.  Did discuss that she could see an allergist for further allergy testing given her history of recurrent hives.  I discussed that I do not feel that  there are any alternative treatments that are indicated at this time.  Patient expressed understanding with this.  She is comfortable discharge home.  Strict return precautions provided including for development of anaphylaxis type symptoms.  Patient discharged stable condition.     FINAL CLINICAL IMPRESSION(S) / ED DIAGNOSES   Final diagnoses:  Urticaria     Rx / DC Orders   ED Discharge Orders     None        Note:  This document was prepared using Dragon voice recognition software and may include unintentional dictation errors.   Trinna Post, MD 09/17/22 [phone]

## 2022-09-17 NOTE — ED Triage Notes (Signed)
Pt reports onset of itching and hives about 2 hours ago. States similar episode yesterday for about 3 hours and went away. Started using new detergent (Gain). No sob, trouble swallowing. She is [redacted] weeks pregnant.

## 2022-09-18 ENCOUNTER — Telehealth: Payer: Self-pay

## 2022-09-18 NOTE — Telephone Encounter (Signed)
Pt calling for a call back.  [phone]  Pt states benadryl, claritin, nor 1% hydrocortisone cream is not working; still have hives that are very itchy; can't work like this; dermatology can't see her anytime soon.  Adv will have schedulers call her to get her scheduled.

## 2022-09-18 NOTE — Telephone Encounter (Signed)
Pt aware.  They don't want to see her b/c she is preg per pt; pt says "that's fine".

## 2022-09-18 NOTE — Telephone Encounter (Signed)
I contacted the patient for scheduling of [facility] intake, the patient confirmed scheduling for 8/5 .

## 2022-09-24 ENCOUNTER — Telehealth: Payer: Self-pay | Admitting: Obstetrics and Gynecology

## 2022-09-24 ENCOUNTER — Ambulatory Visit: Payer: Medicaid Other

## 2022-09-24 DIAGNOSIS — Z3480 Encounter for supervision of other normal pregnancy, unspecified trimester: Secondary | ICD-10-CM | POA: Insufficient documentation

## 2022-09-24 NOTE — Telephone Encounter (Signed)
Reached out to pt to reschedule [facility] nurse intake appt that was scheduled on 09/24/2022 at 11:15.  Was able to reschedule the appt for 09/25/2022 at 2:15.

## 2022-09-25 ENCOUNTER — Other Ambulatory Visit: Payer: Self-pay

## 2022-09-25 ENCOUNTER — Ambulatory Visit (INDEPENDENT_AMBULATORY_CARE_PROVIDER_SITE_OTHER): Payer: Self-pay

## 2022-09-25 VITALS — Wt 331.0 lb

## 2022-09-25 DIAGNOSIS — O219 Vomiting of pregnancy, unspecified: Secondary | ICD-10-CM

## 2022-09-25 DIAGNOSIS — Z3480 Encounter for supervision of other normal pregnancy, unspecified trimester: Secondary | ICD-10-CM

## 2022-09-25 DIAGNOSIS — Z3689 Encounter for other specified antenatal screening: Secondary | ICD-10-CM

## 2022-09-25 MED ORDER — ONDANSETRON 4 MG PO TBDP
4.0000 mg | ORAL_TABLET | Freq: Four times a day (QID) | ORAL | 0 refills | Status: DC | PRN
Start: 2022-09-25 — End: 2023-01-06

## 2022-09-25 NOTE — Patient Instructions (Addendum)
First Trimester of Pregnancy  The first trimester of pregnancy starts on the first day of your last menstrual period until the end of week 12. This is also called months 1 through 3 of pregnancy. Body changes during your first trimester Your body goes through many changes during pregnancy. The changes usually return to normal after your baby is born. Physical changes You may gain or lose weight. Your breasts may grow larger and hurt. The area around your nipples may get darker. Dark spots or blotches may develop on your face. You may have changes in your hair. Health changes You may feel like you might vomit (nauseous), and you may vomit. You may have heartburn. You may have headaches. You may have trouble pooping (constipation). Your gums may bleed. Other changes You may get tired easily. You may pee (urinate) more often. Your menstrual periods will stop. You may not feel hungry. You may want to eat certain kinds of food. You may have changes in your emotions from day to day. You may have more dreams. Follow these instructions at home: Medicines Take over-the-counter and prescription medicines only as told by your doctor. Some medicines are not safe during pregnancy. Take a prenatal vitamin that contains at least 600 micrograms (mcg) of folic acid. Eating and drinking Eat healthy meals that include: Fresh fruits and vegetables. Whole grains. Good sources of protein, such as meat, eggs, or tofu. Low-fat dairy products. Avoid raw meat and unpasteurized juice, milk, and cheese. If you feel like you may vomit, or you vomit: Eat 4 or 5 small meals a day instead of 3 large meals. Try eating a few soda crackers. Drink liquids between meals instead of during meals. You may need to take these actions to prevent or treat trouble pooping: Drink enough fluids to keep your pee (urine) pale yellow. Eat foods that are high in fiber. These include beans, whole grains, and fresh fruits and  vegetables. Limit foods that are high in fat and sugar. These include fried or sweet foods. Activity Exercise only as told by your doctor. Most people can do their usual exercise routine during pregnancy. Stop exercising if you have cramps or pain in your lower belly (abdomen) or low back. Do not exercise if it is too hot or too humid, or if you are in a place of great height (high altitude). Avoid heavy lifting. If you choose to, you may have sex unless your doctor tells you not to. Relieving pain and discomfort Wear a good support bra if your breasts are sore. Rest with your legs raised (elevated) if you have leg cramps or low back pain. If you have bulging veins (varicose veins) in your legs: Wear support hose as told by your doctor. Raise your feet for 15 minutes, 3-4 times a day. Limit salt in your food. Safety Wear your seat belt at all times when you are in a car. Talk with your doctor if someone is hurting you or yelling at you. Talk with your doctor if you are feeling sad or have thoughts of hurting yourself. Lifestyle Do not use hot tubs, steam rooms, or saunas. Do not douche. Do not use tampons or scented sanitary pads. Do not use herbal medicines, illegal drugs, or medicines that are not approved by your doctor. Do not drink alcohol. Do not smoke or use any products that contain nicotine or tobacco. If you need help quitting, ask your doctor. Avoid cat litter boxes and soil that is used by cats. These carry   germs that can cause harm to the baby and can cause a loss of your baby by miscarriage or stillbirth. General instructions Keep all follow-up visits. This is important. Ask for help if you need counseling or if you need help with nutrition. Your doctor can give you advice or tell you where to go for help. Visit your dentist. At home, brush your teeth with a soft toothbrush. Floss gently. Write down your questions. Take them to your prenatal visits. Where to find more  information American Pregnancy Association: americanpregnancy.org American College of Obstetricians and Gynecologists: www.acog.org Office on Women's Health: womenshealth.gov/pregnancy Contact a doctor if: You are dizzy. You have a fever. You have mild cramps or pressure in your lower belly. You have a nagging pain in your belly area. You continue to feel like you may vomit, you vomit, or you have watery poop (diarrhea) for 24 hours or longer. You have a bad-smelling fluid coming from your vagina. You have pain when you pee. You are exposed to a disease that spreads from person to person, such as chickenpox, measles, Zika virus, HIV, or hepatitis. Get help right away if: You have spotting or bleeding from your vagina. You have very bad belly cramping or pain. You have shortness of breath or chest pain. You have any kind of injury, such as from a fall or a car crash. You have new or increased pain, swelling, or redness in an arm or leg. Summary The first trimester of pregnancy starts on the first day of your last menstrual period until the end of week 12 (months 1 through 3). Eat 4 or 5 small meals a day instead of 3 large meals. Do not smoke or use any products that contain nicotine or tobacco. If you need help quitting, ask your doctor. Keep all follow-up visits. This information is not intended to replace advice given to you by your health care provider. Make sure you discuss any questions you have with your health care provider. Document Revised: 07/15/2019 Document Reviewed: 05/21/2019 Elsevier Patient Education  2024 Elsevier Inc. Commonly Asked Questions During Pregnancy  Cats: A parasite can be excreted in cat feces.  To avoid exposure you need to have another person empty the little box.  If you must empty the litter box you will need to wear gloves.  Wash your hands after handling your cat.  This parasite can also be found in raw or undercooked meat so this should also be  avoided.  Colds, Sore Throats, Flu: Please check your medication sheet to see what you can take for symptoms.  If your symptoms are unrelieved by these medications please call the office.  Dental Work: Most any dental work your dentist recommends is permitted.  X-rays should only be taken during the first trimester if absolutely necessary.  Your abdomen should be shielded with a lead apron during all x-rays.  Please notify your provider prior to receiving any x-rays.  Novocaine is fine; gas is not recommended.  If your dentist requires a note from us prior to dental work please call the office and we will provide one for you.  Exercise: Exercise is an important part of staying healthy during your pregnancy.  You may continue most exercises you were accustomed to prior to pregnancy.  Later in your pregnancy you will most likely notice you have difficulty with activities requiring balance like riding a bicycle.  It is important that you listen to your body and avoid activities that put you at a higher   risk of falling.  Adequate rest and staying well hydrated are a must!  If you have questions about the safety of specific activities ask your provider.    Exposure to Children with illness: Try to avoid obvious exposure; report any symptoms to us when noted,  If you have chicken pos, red measles or mumps, you should be immune to these diseases.   Please do not take any vaccines while pregnant unless you have checked with your OB provider.  Fetal Movement: After 28 weeks we recommend you do "kick counts" twice daily.  Lie or sit down in a calm quiet environment and count your baby movements "kicks".  You should feel your baby at least 10 times per hour.  If you have not felt 10 kicks within the first hour get up, walk around and have something sweet to eat or drink then repeat for an additional hour.  If count remains less than 10 per hour notify your provider.  Fumigating: Follow your pest control agent's  advice as to how long to stay out of your home.  Ventilate the area well before re-entering.  Hemorrhoids:   Most over-the-counter preparations can be used during pregnancy.  Check your medication to see what is safe to use.  It is important to use a stool softener or fiber in your diet and to drink lots of liquids.  If hemorrhoids seem to be getting worse please call the office.   Hot Tubs:  Hot tubs Jacuzzis and saunas are not recommended while pregnant.  These increase your internal body temperature and should be avoided.  Intercourse:  Sexual intercourse is safe during pregnancy as long as you are comfortable, unless otherwise advised by your provider.  Spotting may occur after intercourse; report any bright red bleeding that is heavier than spotting.  Labor:  If you know that you are in labor, please go to the hospital.  If you are unsure, please call the office and let us help you decide what to do.  Lifting, straining, etc:  If your job requires heavy lifting or straining please check with your provider for any limitations.  Generally, you should not lift items heavier than that you can lift simply with your hands and arms (no back muscles)  Painting:  Paint fumes do not harm your pregnancy, but may make you ill and should be avoided if possible.  Latex or water based paints have less odor than oils.  Use adequate ventilation while painting.  Permanents & Hair Color:  Chemicals in hair dyes are not recommended as they cause increase hair dryness which can increase hair loss during pregnancy.  " Highlighting" and permanents are allowed.  Dye may be absorbed differently and permanents may not hold as well during pregnancy.  Sunbathing:  Use a sunscreen, as skin burns easily during pregnancy.  Drink plenty of fluids; avoid over heating.  Tanning Beds:  Because their possible side effects are still unknown, tanning beds are not recommended.  Ultrasound Scans:  Routine ultrasounds are performed  at approximately 20 weeks.  You will be able to see your baby's general anatomy an if you would like to know the gender this can usually be determined as well.  If it is questionable when you conceived you may also receive an ultrasound early in your pregnancy for dating purposes.  Otherwise ultrasound exams are not routinely performed unless there is a medical necessity.  Although you can request a scan we ask that you pay for it when   conducted because insurance does not cover " patient request" scans.  Work: If your pregnancy proceeds without complications you may work until your due date, unless your physician or employer advises otherwise.  Round Ligament Pain/Pelvic Discomfort:  Sharp, shooting pains not associated with bleeding are fairly common, usually occurring in the second trimester of pregnancy.  They tend to be worse when standing up or when you remain standing for long periods of time.  These are the result of pressure of certain pelvic ligaments called "round ligaments".  Rest, Tylenol and heat seem to be the most effective relief.  As the womb and fetus grow, they rise out of the pelvis and the discomfort improves.  Please notify the office if your pain seems different than that described.  It may represent a more serious condition.  Common Medications Safe in Pregnancy  Acne:      Constipation:  Benzoyl Peroxide     Colace  Clindamycin      Dulcolax Suppository  Topica Erythromycin     Fibercon  Salicylic Acid      Metamucil         Miralax AVOID:        Senakot   Accutane    Cough:  Retin-A       Cough Drops  Tetracycline      Phenergan w/ Codeine if Rx  Minocycline      Robitussin (Plain & DM)  Antibiotics:     Crabs/Lice:  Ceclor       RID  Cephalosporins    AVOID:  E-Mycins      Kwell  Keflex  Macrobid/Macrodantin   Diarrhea:  Penicillin      Kao-Pectate  Zithromax      Imodium AD         PUSH FLUIDS AVOID:       Cipro     Fever:  Tetracycline      Tylenol (Regular  or Extra  Minocycline       Strength)  Levaquin      Extra Strength-Do not          Exceed 8 tabs/24 hrs Caffeine:        <200mg/day (equiv. To 1 cup of coffee or  approx. 3 12 oz sodas)         Gas: Cold/Hayfever:       Gas-X  Benadryl      Mylicon  Claritin       Phazyme  **Claritin-D        Chlor-Trimeton    Headaches:  Dimetapp      ASA-Free Excedrin  Drixoral-Non-Drowsy     Cold Compress  Mucinex (Guaifenasin)     Tylenol (Regular or Extra  Sudafed/Sudafed-12 Hour     Strength)  **Sudafed PE Pseudoephedrine   Tylenol Cold & Sinus     Vicks Vapor Rub  Zyrtec  **AVOID if Problems With Blood Pressure         Heartburn: Avoid lying down for at least 1 hour after meals  Aciphex      Maalox     Rash:  Milk of Magnesia     Benadryl    Mylanta       1% Hydrocortisone Cream  Pepcid  Pepcid Complete   Sleep Aids:  Prevacid      Ambien   Prilosec       Benadryl  Rolaids       Chamomile Tea  Tums (Limit 4/day)     Unisom           Tylenol PM         Warm milk-add vanilla or  Hemorrhoids:       Sugar for taste  Anusol/Anusol H.C.  (RX: Analapram 2.5%)  Sugar Substitutes:  Hydrocortisone OTC     Ok in moderation  Preparation H      Tucks        Vaseline lotion applied to tissue with wiping    Herpes:     Throat:  Acyclovir      Oragel  Famvir  Valtrex     Vaccines:         Flu Shot Leg Cramps:       *Gardasil  Benadryl      Hepatitis A         Hepatitis B Nasal Spray:       Pneumovax  Saline Nasal Spray     Polio Booster         Tetanus Nausea:       Tuberculosis test or PPD  Vitamin B6 25 mg TID   AVOID:    Dramamine      *Gardasil  Emetrol       Live Poliovirus  Ginger Root 250 mg QID    MMR (measles, mumps &  High Complex Carbs @ Bedtime    rebella)  Sea Bands-Accupressure    Varicella (Chickenpox)  Unisom 1/2 tab TID     *No known complications           If received before Pain:         Known pregnancy;   Darvocet       Resume series  after  Lortab        Delivery  Percocet    Yeast:   Tramadol      Femstat  Tylenol 3      Gyne-lotrimin  Ultram       Monistat  Vicodin           MISC:         All Sunscreens           Hair Coloring/highlights          Insect Repellant's          (Including DEET)         Mystic Tans  

## 2022-09-25 NOTE — Progress Notes (Signed)
New OB Intake  I connected with  Katie Roberson on 09/25/22 at  2:15 PM EDT by in person and verified that I am speaking with the correct person using two identifiers. Nurse is located at [facility] and pt is located at our office.  I explained I am completing New OB Intake today. We discussed her EDD of 05/02/2023 that is based on u/s of [redacted]w[redacted]d. Pt is G3/P1011. I reviewed her allergies, medications, Medical/Surgical/OB history, and appropriate screenings. There are no cats in the home.  Based on history, this is a/an pregnancy uncomplicated .   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 Adv pt to go ahead and schedule NOB exam and if she does have an abortion to call and cancel appt.  Delivery Plans:  Plans to deliver at [facility].  Anatomy US Explained first scheduled [place] was July 28th and an anatomy scan will be done at 20 weeks.  Labs Discussed genetic screening with patient. Patient desires genetic testing to be drawn at new OB visit. Discussed possible labs to be drawn at new OB appointment.  COVID Vaccine Patient has had COVID vaccine.   Social Determinants of Health Food Insecurity: denies food insecurity  Transportation: Patient denies transportation  needs. Childcare: Discussed no children allowed at ultrasound appointments.   First visit review I reviewed new OB appt with pt. I explained she will have ob bloodwork and pap smear/pelvic exam if indicated. Explained pt will be seen by an AOB provider at first visit; encounter routed to appropriate provider.   Loran Senters, [place] 09/25/2022  2:28 PM

## 2022-10-11 ENCOUNTER — Telehealth: Payer: Self-pay

## 2022-10-11 NOTE — Telephone Encounter (Signed)
Pt calling; c/o vaginal dryness; wants information about the estring - saw it on line and wants to know how it works.  Adv it could not be used while preg.  Pt states she plans to have an abortion in the next week. Adv needs face to face appt to discuss medication.  Pt states she has tried everything she can try otc for the vaginal dryness.

## 2022-10-12 ENCOUNTER — Ambulatory Visit (INDEPENDENT_AMBULATORY_CARE_PROVIDER_SITE_OTHER): Payer: Self-pay | Admitting: Licensed Practical Nurse

## 2022-10-12 ENCOUNTER — Other Ambulatory Visit (HOSPITAL_COMMUNITY)
Admission: RE | Admit: 2022-10-12 | Discharge: 2022-10-12 | Disposition: A | Discharge status: Home / Self Care | Payer: Medicaid Other | Source: Ambulatory Visit

## 2022-10-12 ENCOUNTER — Encounter: Payer: Self-pay | Admitting: Licensed Practical Nurse

## 2022-10-12 VITALS — BP 113/68 | HR 79 | Wt 337.7 lb

## 2022-10-12 DIAGNOSIS — N898 Other specified noninflammatory disorders of vagina: Secondary | ICD-10-CM

## 2022-10-12 NOTE — Progress Notes (Signed)
Routine Prenatal Care Visit  Subjective  Katie Roberson is a 32 y.o. G3P1011 at [redacted]w[redacted]d being seen today for ongoing prenatal care.  She is currently monitored for the following issues for this high-risk pregnancy and has Morbid obesity (HCC); PCOS (polycystic ovarian syndrome); Abnormal vaginal bleeding; Abnormal uterine bleeding (AUB); Screening examination for STD (sexually transmitted disease); Cholecystitis; Acute cholecystitis; Calculus of gallbladder with acute cholecystitis without obstruction; Infertility associated with anovulation; Candidal vaginitis; Labor and delivery, indication for care; and Supervision of other normal pregnancy, antepartum on their problem list.  ----------------------------------------------------------------------------------- Patient reports  Vaginal dryness .   Pt here to discuss vaginal dryness only. She is scheduled to terminate her pregnancy in 1 week.   Pt states she does desire a pregnancy in the future "with the right person", she is considering the IUD   Vaginal dryness: has been occurring for the last 2 years. Reports she "feels dry" "there is no moisture", has an awareness of dryness, she uses lubricant after IC and than feels more dry after the IC.  She has tried Replens and coconut oil. She also uses a "vaginal wash". She uses dove or other gentle soaps with a pleasant smell for bathing. She wears cotton underwear, Denies any itching or changes to her discharge. Has not noticed any spotting. She has heard about Estring and wonders if she can use that.   Vulva: normal appearance Vagina slightly reddened SSE: cervix pink, no lesions, small amount of clear discharge present Vaginal discharge ph 6.0   Contractions: Not present. Vag. Bleeding: None.  Movement: Absent. Leaking Fluid denies.  ----------------------------------------------------------------------------------- The following portions of the patient's history were reviewed and updated as  appropriate: allergies, current medications, past family history, past medical history, past social history, past surgical history and problem list. Problem list updated.  Objective  Blood pressure 113/68, pulse 79, weight (!) 337 lb 11.2 oz (153.2 kg), unknown if currently breastfeeding. Pregravid weight 325 lb (147.4 kg) Total Weight Gain 12 lb 11.2 oz (5.761 kg) Urinalysis: Urine Protein    Urine Glucose    Fetal Status:     Movement: Absent     General:  Alert, oriented and cooperative. Patient is in no acute distress.  Skin: Skin is warm and dry. No rash noted.   Cardiovascular: Normal heart rate noted  Respiratory: Normal respiratory effort, no problems with respiration noted  Abdomen: Soft, gravid, appropriate for gestational age. Pain/Pressure: Absent     Pelvic:  Cervical exam performed        Extremities: Normal range of motion.     Mental Status: Normal mood and affect. Normal behavior. Normal judgment and thought content.   Assessment   32 y.o. G3P1011 at [redacted]w[redacted]d by  05/02/2023, by Ultrasound presenting for work-in prenatal visit  Plan   third Problems (from 09/25/22 to present)     Problem Noted Resolved   Supervision of other normal pregnancy, antepartum 09/24/2022 by Loran Senters, CMA No   Overview Addendum 09/25/2022  2:28 PM by Loran Senters, CMA     Clinical Staff Provider  Office Location  [facility] Dating  Not found.  Language  English Anatomy US    Flu Vaccine  offer Genetic Screen  NIPS:   TDaP vaccine   offer Hgb A1C or  GTT Early : Third trimester :   Covid No boosters   LAB RESULTS   Rhogam     Blood Type     Feeding Plan Formula/ try br Antibody  Contraception nexplanon Rubella    Circumcision yes RPR     Pediatrician  undecided HBsAg     Support Person Lillia Abed HIV    Prenatal Classes no Varicella     GBS  (For PCN allergy, check sensitivities)   BTL Consent  Hep C     VBAC Consent  Pap Diagnosis  Date Value Ref Range Status  04/22/2020    Final   - Negative for intraepithelial lesion or malignancy (NILM)      Hgb Electro      CF      SMA                     Please refer to After Visit Summary for other counseling recommendations.   Recommend stopping Vaginal wash, info regarding hyaluronic acid sent to pt. General vaginal hygiene reviewed.  At this time she is not a candidate for estring as she is currently pregnant, outside of pregnancy her BMI is elevated which is a contraindication for estrogen products.   Aptima swab sent to rule out infection   Carie Caddy, CNM   [facility]  10/12/22  12:21 PM

## 2022-10-15 LAB — CERVICOVAGINAL ANCILLARY ONLY
Bacterial Vaginitis (gardnerella): NEGATIVE
Candida Glabrata: NEGATIVE
Candida Vaginitis: NEGATIVE
Chlamydia: NEGATIVE
Comment: NEGATIVE
Comment: NEGATIVE
Comment: NEGATIVE
Comment: NEGATIVE
Comment: NEGATIVE
Comment: NORMAL
Neisseria Gonorrhea: NEGATIVE
Trichomonas: NEGATIVE

## 2023-01-06 ENCOUNTER — Emergency Department (HOSPITAL_COMMUNITY)
Admission: EM | Admit: 2023-01-06 | Discharge: 2023-01-06 | Disposition: A | Discharge status: Home / Self Care | Payer: Medicaid Other | Attending: Student | Admitting: Student

## 2023-01-06 ENCOUNTER — Other Ambulatory Visit: Payer: Self-pay

## 2023-01-06 ENCOUNTER — Encounter (HOSPITAL_COMMUNITY): Payer: Self-pay | Admitting: Emergency Medicine

## 2023-01-06 DIAGNOSIS — Z202 Contact with and (suspected) exposure to infections with a predominantly sexual mode of transmission: Secondary | ICD-10-CM | POA: Insufficient documentation

## 2023-01-06 DIAGNOSIS — Z87891 Personal history of nicotine dependence: Secondary | ICD-10-CM | POA: Insufficient documentation

## 2023-01-06 LAB — RAPID HIV SCREEN (HIV 1/2 AB+AG)
HIV 1/2 Antibodies: NONREACTIVE
HIV-1 P24 Antigen - HIV24: NONREACTIVE

## 2023-01-06 LAB — PREGNANCY, URINE: Preg Test, Ur: NEGATIVE

## 2023-01-06 MED ORDER — ELVITEG-COBIC-EMTRICIT-TENOFAF 150-150-200-10 MG PO TABS: 1.0000 | ORAL_TABLET | Freq: Every day | ORAL | 0 refills | Status: DC

## 2023-01-06 MED ORDER — ONDANSETRON 4 MG PO TBDP: 4.0000 mg | ORAL_TABLET | Freq: Three times a day (TID) | ORAL | 0 refills | Status: DC | PRN

## 2023-01-06 MED ORDER — DOXYCYCLINE HYCLATE 100 MG PO TABS: 100.0000 mg | ORAL_TABLET | Freq: Two times a day (BID) | ORAL | 0 refills | Status: DC

## 2023-01-06 MED ORDER — ELVITEG-COBIC-EMTRICIT-TENOFAF 150-150-200-10 MG PREPACK
1.0000 | ORAL_TABLET | Freq: Once | ORAL | Status: AC
Start: 2023-01-06 — End: 2023-01-06
  Administered 2023-01-06: 1 via ORAL
  Filled 2023-01-06: qty 1

## 2023-01-06 MED ORDER — CEFTRIAXONE SODIUM 500 MG IJ SOLR
500.0000 mg | Freq: Once | INTRAMUSCULAR | Status: AC
  Administered 2023-01-06: 500 mg via INTRAMUSCULAR
  Filled 2023-01-06: qty 500

## 2023-01-06 MED ORDER — LIDOCAINE HCL (PF) 1 % IJ SOLN
1.0000 mL | Freq: Once | INTRAMUSCULAR | Status: AC
  Administered 2023-01-06: 1 mL
  Filled 2023-01-06: qty 5

## 2023-01-06 MED ORDER — DOXYCYCLINE HYCLATE 100 MG PO TABS
100.0000 mg | ORAL_TABLET | Freq: Once | ORAL | Status: AC
  Administered 2023-01-06: 100 mg via ORAL
  Filled 2023-01-06: qty 1

## 2023-01-06 NOTE — ED Triage Notes (Signed)
Pt states she would like PEP for possible HIV Exposure approximately 3-4 hrs ago.

## 2023-01-06 NOTE — ED Provider Notes (Signed)
[place] EMERGENCY DEPARTMENT AT [facility] Provider Note  CSN: [id number] Arrival date & time: 01/06/23 0431  Chief Complaint(s) Exposure to STD  HPI Katie Roberson is a 32 y.o. female who presents emergency room for evaluation of STD exposure.  Patient states that she had unprotected sex with a patient who has HIV approximately 3 to 4 hours prior to arrival.  Arrives with request for postexposure prophylaxis.  Denies any symptoms at this time.   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 Authorizing Provider  doxycycline (VIBRA-TABS) 100 MG tablet Take 1 tablet (100 mg total) by mouth 2 (two) times daily. 01/06/23  Yes Carina Chaplin, MD  elvitegravir-cobicistat-emtricitabine-tenofovir (GENVOYA) 150-150-200-10 MG TABS tablet Take 1 tablet by mouth daily with breakfast. 01/06/23  Yes Cyree Chuong, MD  ondansetron (ZOFRAN-ODT) 4 MG disintegrating tablet Take 1 tablet (4 mg total) by mouth every 8 (eight) hours as needed for nausea or vomiting. 01/06/23  Yes Aoki Wedemeyer, MD  acyclovir (ZOVIRAX) 400 MG tablet Take 400 mg by mouth 2 (two) times daily.    [provider]  EPINEPHrine 0.3 mg/0.3 mL IJ SOAJ  injection Inject 0.3 mg into the muscle as needed for anaphylaxis. 11/12/20   Nita Sickle, MD  EQ ALL DAY ALLERGY RELIEF 10 MG tablet Take 10 mg by mouth daily. 09/17/22   [provider]  famotidine (PEPCID) 20 MG tablet Take 1 tablet (20 mg total) by mouth 2 (two) times daily. 11/18/21   Irean Hong, MD  fenofibrate (TRICOR) 48 MG tablet Take 48 mg by mouth daily. 03/30/22   [provider]  Lactic Ac-Citric Ac-Pot Bitart (PHEXXI) 1.8-1-0.4 % GEL Place 1 applicator vaginally as needed (use prior to sexual intercourse as directed). Patient not taking: Reported on 09/25/2022 07/05/22   Linzie Collin, MD  potassium chloride (KLOR-CON) 10 MEQ tablet Take 1 tablet (10 mEq total) by mouth 2 (two) times daily for 5 days. 05/28/22 06/02/22  Dionne Bucy, MD  TRI-LO-ESTARYLLA 0.18/0.215/0.25 MG-25 MCG tab Take 1 tablet by mouth daily. Patient not taking: Reported on 09/25/2022 05/14/22   [provider]  triamcinolone cream (KENALOG) 0.1 % Apply 1 Application topically as needed. 09/17/22   [provider]  Past Surgical History Past Surgical History:  Procedure Laterality Date   CHOLECYSTECTOMY N/A 02/28/2018   Procedure: LAPAROSCOPIC CHOLECYSTECTOMY;  Surgeon: Franky Macho, MD;  Location: [facility];  Service: General;  Laterality: N/A;   WISDOM TOOTH EXTRACTION     FOUR; age 40   Family History Family History  Problem Relation Age of Onset   Healthy Mother    Healthy Father    Heart Problems Maternal Grandmother    Alzheimer's disease Maternal Grandfather    Diabetes Paternal Grandmother    Alzheimer's disease Paternal Grandfather    Healthy Half-Sister    Healthy Half-Sister     Social History Social History   Tobacco Use   Smoking status: Former    Types: Cigarettes   Smokeless tobacco: Never  Vaping Use   Vaping  status: Former   Substances: Nicotine   Devices: quit 2023  Substance Use Topics   Alcohol use: Not Currently    Comment: socially- not since pregnancy   Drug use: No   Allergies Kiwi extract, Grapefruit concentrate, Grapefruit extract, and Soap  Review of Systems Review of Systems  All other systems reviewed and are negative.   Physical Exam Vital Signs  I have reviewed the triage vital signs BP 128/77   Pulse 82   Temp 98.2 F (36.8 C)   Resp 16   Ht 5\' 9"  (1.753 m)   Wt (!) 148.8 kg   LMP  (LMP Unknown)   SpO2 100%   Breastfeeding Unknown   BMI 48.44 kg/m   Physical Exam Vitals and nursing note reviewed.  Constitutional:      General: She is not in acute distress.    Appearance: She is well-developed.  HENT:     Head: Normocephalic and atraumatic.  Eyes:     Conjunctiva/sclera: Conjunctivae normal.  Cardiovascular:     Rate and Rhythm: Normal rate and regular rhythm.     Heart sounds: No murmur heard. Pulmonary:     Effort: Pulmonary effort is normal. No respiratory distress.     Breath sounds: Normal breath sounds.  Abdominal:     Palpations: Abdomen is soft.     Tenderness: There is no abdominal tenderness.  Musculoskeletal:        General: No swelling.     Cervical back: Neck supple.  Skin:    General: Skin is warm and dry.     Capillary Refill: Capillary refill takes less than 2 seconds.  Neurological:     Mental Status: She is alert.  Psychiatric:        Mood and Affect: Mood normal.     ED Results and Treatments Labs (all labs ordered are listed, but only abnormal results are displayed) Labs Reviewed  PREGNANCY, URINE  RAPID HIV SCREEN (HIV 1/2 AB+AG)                                                                                                                          Radiology No  results found.  Pertinent labs & imaging results that were available during my care of the patient were reviewed by me and considered in my medical  decision making (see MDM for details).  Medications Ordered in ED Medications  elvitegravir-cobicistat-emtricitabine-tenofovir (GENVOYA) 150-150-200-10 Prepack 1 each (has no administration in time range)  cefTRIAXone (ROCEPHIN) injection 500 mg (has no administration in time range)  lidocaine (PF) (XYLOCAINE) 1 % injection 1-2.1 mL (has no administration in time range)  doxycycline (VIBRA-TABS) tablet 100 mg (has no administration in time range)                                                                                                                                     Procedures Procedures  (including critical care time)  Medical Decision Making / ED Course   This patient presents to the ED for concern of STD exposure, this involves an extensive number of treatment options, and is a complaint that carries with it a high risk of complications and morbidity.  The differential diagnosis includes HIV exposure, gonorrhea/chlamydia exposure, herpes exposure  MDM: Patient seen emergency room for evaluation of an STD exposure and request for postexposure prophylaxis.  Physical exam is unremarkable.  Pregnancy negative.  Postexposure prophylaxis for HIV ordered and patient critically treated with ceftriaxone doxycycline.  Postexposure prophylaxis prescription sent to patient's pharmacy along with a prescription for Zofran as these medications can make patient nauseous.  At this time she does not meet inpatient criteria for admission and will be discharged with outpatient follow-up   Additional history obtained:  -External records from outside source obtained and reviewed including: Chart review including previous notes, labs, imaging, consultation notes   Lab Tests: -I ordered, reviewed, and interpreted labs.   The pertinent results include:   Labs Reviewed  PREGNANCY, URINE  RAPID HIV SCREEN (HIV 1/2 AB+AG)      Medicines ordered and prescription drug management: Meds ordered  this encounter  Medications   elvitegravir-cobicistat-emtricitabine-tenofovir (GENVOYA) 150-150-200-10 Prepack 1 each   elvitegravir-cobicistat-emtricitabine-tenofovir (GENVOYA) 150-150-200-10 MG TABS tablet    Sig: Take 1 tablet by mouth daily with breakfast.    Dispense:  30 tablet    Refill:  0   cefTRIAXone (ROCEPHIN) injection 500 mg    Order Specific Question:   Antibiotic Indication:    Answer:   STD   lidocaine (PF) (XYLOCAINE) 1 % injection 1-2.1 mL   doxycycline (VIBRA-TABS) 100 MG tablet    Sig: Take 1 tablet (100 mg total) by mouth 2 (two) times daily.    Dispense:  14 tablet    Refill:  0   doxycycline (VIBRA-TABS) tablet 100 mg   ondansetron (ZOFRAN-ODT) 4 MG disintegrating tablet    Sig: Take 1 tablet (4 mg total) by mouth every 8 (eight) hours as needed for nausea or vomiting.    Dispense:  20 tablet    Refill:  0    -I have  reviewed the patients home medicines and have made adjustments as needed  Critical interventions none   Social Determinants of Health:  Factors impacting patients care include: Recent unprotected sex 3 to 4 hours prior to arrival   Reevaluation: After the interventions noted above, I reevaluated the patient and found that they have :stayed the same  Co morbidities that complicate the patient evaluation  Past Medical History:  Diagnosis Date   Abnormal vaginal bleeding 01/29/2013   Anxiety    Chicken pox    Chronic abdominal pain    Chronic pelvic pain in female    Dysmenorrhea    Gallstones    PCOS (polycystic ovarian syndrome)    Vaginal pain    chronic      Dispostion: I considered admission for this patient, but at this time she does not meet inpatient criteria for admission and will be discharged with outpatient follow-up     Final Clinical Impression(s) / ED Diagnoses Final diagnoses:  STD exposure     @PCDICTATION @    Glendora Score, MD 01/06/23 [phone]

## 2023-01-14 ENCOUNTER — Other Ambulatory Visit: Payer: Self-pay

## 2023-01-14 ENCOUNTER — Emergency Department (HOSPITAL_COMMUNITY)
Admission: EM | Admit: 2023-01-14 | Discharge: 2023-01-14 | Disposition: A | Discharge status: Home / Self Care | Payer: Medicaid Other | Attending: Emergency Medicine | Admitting: Emergency Medicine

## 2023-01-14 ENCOUNTER — Encounter (HOSPITAL_COMMUNITY): Payer: Self-pay | Admitting: Emergency Medicine

## 2023-01-14 DIAGNOSIS — F10129 Alcohol abuse with intoxication, unspecified: Secondary | ICD-10-CM | POA: Insufficient documentation

## 2023-01-14 DIAGNOSIS — R112 Nausea with vomiting, unspecified: Secondary | ICD-10-CM | POA: Diagnosis present

## 2023-01-14 DIAGNOSIS — F10120 Alcohol abuse with intoxication, uncomplicated: Secondary | ICD-10-CM

## 2023-01-14 DIAGNOSIS — Z87891 Personal history of nicotine dependence: Secondary | ICD-10-CM | POA: Diagnosis not present

## 2023-01-14 DIAGNOSIS — Y900 Blood alcohol level of less than 20 mg/100 ml: Secondary | ICD-10-CM | POA: Insufficient documentation

## 2023-01-14 LAB — ETHANOL: Alcohol, Ethyl (B): 15 mg/dL — ABNORMAL HIGH (ref <10)

## 2023-01-14 LAB — COMPREHENSIVE METABOLIC PANEL
ALT: 16 U/L (ref 0–44)
AST: 21 U/L (ref 15–41)
Albumin: 3.9 g/dL (ref 3.5–5.0)
Alkaline Phosphatase: 86 U/L (ref 38–126)
Anion gap: 9 (ref 5–15)
BUN: 9 mg/dL (ref 6–20)
CO2: 21 mmol/L — ABNORMAL LOW (ref 22–32)
Calcium: 9.1 mg/dL (ref 8.9–10.3)
Chloride: 111 mmol/L (ref 98–111)
Creatinine, Ser: 0.79 mg/dL (ref 0.44–1.00)
GFR, Estimated: >60 mL/min (ref >60)
Glucose, Bld: 103 mg/dL — ABNORMAL HIGH (ref 70–99)
Potassium: 4 mmol/L (ref 3.5–5.1)
Sodium: 141 mmol/L (ref 135–145)
Total Bilirubin: 0.7 mg/dL (ref <1.2)
Total Protein: 8.1 g/dL (ref 6.5–8.1)

## 2023-01-14 LAB — CBC
HCT: 40.3 % (ref 36.0–46.0)
Hemoglobin: 13.7 g/dL (ref 12.0–15.0)
MCH: 30.2 pg (ref 26.0–34.0)
MCHC: 34 g/dL (ref 30.0–36.0)
MCV: 89 fL (ref 80.0–100.0)
Platelets: 284 10*3/uL (ref 150–400)
RBC: 4.53 MIL/uL (ref 3.87–5.11)
RDW: 13.5 % (ref 11.5–15.5)
WBC: 9.7 10*3/uL (ref 4.0–10.5)
nRBC: 0 % (ref 0.0–0.2)

## 2023-01-14 LAB — PREGNANCY, URINE: Preg Test, Ur: NEGATIVE

## 2023-01-14 MED ORDER — ONDANSETRON HCL 4 MG PO TABS: 4.0000 mg | ORAL_TABLET | Freq: Three times a day (TID) | ORAL | 0 refills | Status: DC | PRN

## 2023-01-14 MED ORDER — KETOROLAC TROMETHAMINE 15 MG/ML IJ SOLN
15.0000 mg | Freq: Once | INTRAMUSCULAR | Status: AC
  Administered 2023-01-14: 15 mg via INTRAMUSCULAR
  Filled 2023-01-14: qty 1

## 2023-01-14 MED ORDER — ONDANSETRON 4 MG PO TBDP
4.0000 mg | ORAL_TABLET | Freq: Once | ORAL | Status: AC
  Administered 2023-01-14: 4 mg via ORAL
  Filled 2023-01-14: qty 1

## 2023-01-14 NOTE — ED Triage Notes (Signed)
Pt c/o of n/v, cough, headache, low grade fever since this morning. Pt states " I think I have alcohol poisoning. I drank 3-4 bottles of liquor last night"

## 2023-01-14 NOTE — ED Provider Notes (Signed)
EMERGENCY DEPARTMENT AT [facility] Provider Note  CSN: [id number] Arrival date & time: 01/14/23 1158  Chief Complaint(s) Vomiting  HPI Katie Roberson is a 32 y.o. female presenting with nausea and vomiting, fatigue, mild headache.  She reports she drank a whole lot of alcohol last night.  Woke up this morning feeling bad and vomiting.  Maybe had a little bit of abdominal cramping which is improved.  No fevers or chills.  No bloody vomit.  No blood in her stool.  No head trauma or falls when she was intoxicated.  Mainly came in because she had persistent vomiting.   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 Authorizing Provider  ondansetron (ZOFRAN) 4 MG tablet Take 1 tablet (4 mg total) by mouth every 8 (eight) hours as needed for nausea or vomiting. 01/14/23  Yes Lonell Grandchild, MD  acyclovir (ZOVIRAX) 400 MG tablet Take 400 mg by mouth 2 (two) times daily.    [provider]  doxycycline (VIBRA-TABS) 100 MG tablet Take 1 tablet (100 mg total) by mouth 2 (two) times daily. 01/06/23   Kommor, Madison, MD  elvitegravir-cobicistat-emtricitabine-tenofovir (GENVOYA) 150-150-200-10 MG TABS tablet Take  1 tablet by mouth daily with breakfast. 01/06/23   Kommor, Madison, MD  EPINEPHrine 0.3 mg/0.3 mL IJ SOAJ injection Inject 0.3 mg into the muscle as needed for anaphylaxis. 11/12/20   Nita Sickle, MD  EQ ALL DAY ALLERGY RELIEF 10 MG tablet Take 10 mg by mouth daily. 09/17/22   [provider]  famotidine (PEPCID) 20 MG tablet Take 1 tablet (20 mg total) by mouth 2 (two) times daily. 11/18/21   Irean Hong, MD  fenofibrate (TRICOR) 48 MG tablet Take 48 mg by mouth daily. 03/30/22   [provider]  Lactic Ac-Citric Ac-Pot Bitart (PHEXXI) 1.8-1-0.4 % GEL Place 1 applicator vaginally as needed (use prior to sexual intercourse as directed). Patient not taking: Reported on 09/25/2022 07/05/22   Linzie Collin, MD  ondansetron (ZOFRAN-ODT) 4 MG disintegrating tablet Take 1 tablet (4 mg total) by mouth every 8 (eight) hours as needed for nausea or vomiting. 01/06/23   Kommor, Madison, MD  potassium chloride (KLOR-CON) 10 MEQ tablet Take 1 tablet (10 mEq total) by mouth 2 (two) times daily for 5 days. 05/28/22 06/02/22  Dionne Bucy, MD  TRI-LO-ESTARYLLA 0.18/0.215/0.25 MG-25 MCG tab Take 1 tablet by mouth daily. Patient not taking: Reported on 09/25/2022 05/14/22   [provider]  triamcinolone cream (KENALOG) 0.1 % Apply 1 Application topically as needed. 09/17/22   [provider]  Past Surgical History Past Surgical History:  Procedure Laterality Date   CHOLECYSTECTOMY N/A 02/28/2018   Procedure: LAPAROSCOPIC CHOLECYSTECTOMY;  Surgeon: Franky Macho, MD;  Location: [facility];  Service: General;  Laterality: N/A;   WISDOM TOOTH EXTRACTION     FOUR; age 38   Family History Family History  Problem Relation Age of Onset   Healthy Mother    Healthy Father    Heart Problems Maternal Grandmother    Alzheimer's disease Maternal  Grandfather    Diabetes Paternal Grandmother    Alzheimer's disease Paternal Grandfather    Healthy Half-Sister    Healthy Half-Sister     Social History Social History   Tobacco Use   Smoking status: Former    Types: Cigarettes   Smokeless tobacco: Never  Vaping Use   Vaping status: Former   Substances: Nicotine   Devices: quit 2023  Substance Use Topics   Alcohol use: Yes    Comment: socially- not since pregnancy   Drug use: No   Allergies Kiwi extract, Grapefruit concentrate, Grapefruit extract, and Soap  Review of Systems Review of Systems  All other systems reviewed and are negative.   Physical Exam Vital Signs  I have reviewed the triage vital signs BP 117/69 (BP Location: Left Arm)   Pulse 68   Temp 99.3 F (37.4 C) (Oral)   Resp 20   Ht 5\' 9"  (1.753 m)   Wt (!) 148.8 kg   LMP  (LMP Unknown)   SpO2 97%   BMI 48.44 kg/m  Physical Exam Vitals and nursing note reviewed.  Constitutional:      General: She is not in acute distress.    Appearance: She is well-developed.  HENT:     Head: Normocephalic and atraumatic.     Mouth/Throat:     Mouth: Mucous membranes are moist.  Eyes:     Pupils: Pupils are equal, round, and reactive to light.  Cardiovascular:     Rate and Rhythm: Normal rate and regular rhythm.     Heart sounds: No murmur heard. Pulmonary:     Effort: Pulmonary effort is normal. No respiratory distress.     Breath sounds: Normal breath sounds.  Abdominal:     General: Abdomen is flat.     Palpations: Abdomen is soft.     Tenderness: There is no abdominal tenderness.  Musculoskeletal:        General: No tenderness.     Right lower leg: No edema.     Left lower leg: No edema.  Skin:    General: Skin is warm and dry.  Neurological:     General: No focal deficit present.     Mental Status: She is alert. Mental status is at baseline.  Psychiatric:        Mood and Affect: Mood normal.        Behavior: Behavior normal.     ED  Results and Treatments Labs (all labs ordered are listed, but only abnormal results are displayed) Labs Reviewed  ETHANOL - Abnormal; Notable for the following components:      Result Value   Alcohol, Ethyl (B) 15 (*)    All other components within normal limits  COMPREHENSIVE METABOLIC PANEL - Abnormal; Notable for the following components:   CO2 21 (*)    Glucose, Bld 103 (*)    All other components within normal limits  CBC  PREGNANCY, URINE  Radiology No results found.  Pertinent labs & imaging results that were available during my care of the patient were reviewed by me and considered in my medical decision making (see MDM for details).  Medications Ordered in ED Medications  ketorolac (TORADOL) 15 MG/ML injection 15 mg (15 mg Intramuscular Given 01/14/23 1549)  ondansetron (ZOFRAN-ODT) disintegrating tablet 4 mg (4 mg Oral Given 01/14/23 1549)                                                                                                                                     Procedures Procedures  (including critical care time)  Medical Decision Making / ED Course   MDM:  32 year old female presenting to the emergency department with vomiting.  Suspect symptoms are due to alcohol hangover.  Her alcohol level here is even slightly elevated suggesting she did have quite a lot to drink yesterday.  No signs of alcohol withdrawal on physical examination.  Her laboratory testing is otherwise reassuring.  Appears well-hydrated.  Abdominal exam is benign, very low concern for acute intra-abdominal process such as pancreatitis, cholecystitis, perforation, obstruction.  No falls or head injury to suggest intracranial bleeding.  Received Toradol and Zofran with improvement.        Lab Tests: -I ordered, reviewed, and interpreted labs.   The pertinent results  include:   Labs Reviewed  ETHANOL - Abnormal; Notable for the following components:      Result Value   Alcohol, Ethyl (B) 15 (*)    All other components within normal limits  COMPREHENSIVE METABOLIC PANEL - Abnormal; Notable for the following components:   CO2 21 (*)    Glucose, Bld 103 (*)    All other components within normal limits  CBC  PREGNANCY, URINE    Notable for mild low CO2, possible mild dehydration     Medicines ordered and prescription drug management: Meds ordered this encounter  Medications   ketorolac (TORADOL) 15 MG/ML injection 15 mg   ondansetron (ZOFRAN-ODT) disintegrating tablet 4 mg   ondansetron (ZOFRAN) 4 MG tablet    Sig: Take 1 tablet (4 mg total) by mouth every 8 (eight) hours as needed for nausea or vomiting.    Dispense:  4 tablet    Refill:  0    -I have reviewed the patients home medicines and have made adjustments as needed  Social Determinants of Health:  Diagnosis or treatment significantly limited by social determinants of health: obesity and alcohol use   Reevaluation: After the interventions noted above, I reevaluated the patient and found that their symptoms have improved  Co morbidities that complicate the patient evaluation  Past Medical History:  Diagnosis Date   Abnormal vaginal bleeding 01/29/2013   Anxiety    Chicken pox    Chronic abdominal pain    Chronic pelvic pain in female    Dysmenorrhea    Gallstones    PCOS (polycystic ovarian  syndrome)    Vaginal pain    chronic      Dispostion: Disposition decision including need for hospitalization was considered, and patient discharged from emergency department.    Final Clinical Impression(s) / ED Diagnoses Final diagnoses:  Hangover without complication (HCC)     This chart was dictated using voice recognition software.  Despite best efforts to proofread,  errors can occur which can change the documentation meaning.    Lonell Grandchild, MD 01/14/23  [phone]

## 2023-01-14 NOTE — Discharge Instructions (Signed)
We evaluated you for feeling bad after your episode of alcohol intoxication last night.  Your laboratory tests are reassuring.  Your symptoms are likely due to a hangover.  Please be sure to stay hydrated.  Please avoid drinking as much alcohol in the future.  Please follow-up with your primary doctor.  If you have any new or worsening symptoms such as severe pain, uncontrolled vomiting, severe headaches, lightheadedness or dizziness, fainting, or any other new symptoms please return to the emergency department.

## 2023-03-22 ENCOUNTER — Other Ambulatory Visit: Payer: Self-pay

## 2023-03-22 ENCOUNTER — Encounter (HOSPITAL_COMMUNITY): Payer: Self-pay

## 2023-03-22 ENCOUNTER — Emergency Department (HOSPITAL_COMMUNITY)
Admission: EM | Admit: 2023-03-22 | Discharge: 2023-03-22 | Disposition: A | Discharge status: Home / Self Care | Payer: Medicaid Other | Attending: Emergency Medicine | Admitting: Emergency Medicine

## 2023-03-22 DIAGNOSIS — Z202 Contact with and (suspected) exposure to infections with a predominantly sexual mode of transmission: Secondary | ICD-10-CM | POA: Insufficient documentation

## 2023-03-22 DIAGNOSIS — N898 Other specified noninflammatory disorders of vagina: Secondary | ICD-10-CM | POA: Diagnosis present

## 2023-03-22 LAB — RAPID HIV SCREEN (HIV 1/2 AB+AG)
HIV 1/2 Antibodies: NONREACTIVE
HIV-1 P24 Antigen - HIV24: NONREACTIVE

## 2023-03-22 LAB — WET PREP, GENITAL
Clue Cells Wet Prep HPF POC: NONE SEEN
Sperm: NONE SEEN
Trich, Wet Prep: NONE SEEN
WBC, Wet Prep HPF POC: <10 (ref <10)
Yeast Wet Prep HPF POC: NONE SEEN

## 2023-03-22 NOTE — ED Triage Notes (Signed)
Pt arrived via POV c/o mild nausea, denies dysuria, denies rash. Pt reports she was exposed approximately a month ago to a partner who may have HIV. Pt requesting to be tested for STI.
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
## 2023-03-22 NOTE — Discharge Instructions (Addendum)
Your HIV testing is not back, please follow-up with your PCP, and find out the results of this.  You will be called if it is positive.  Additionally your gonorrhea/chlamydia testing, will take 3 to 5 days, to come back, you will be called if you test positive for these.  Please make sure you are practicing safe sex, and do not be intimate with anyone until you find out that you have a negative HIV test

## 2023-03-23 LAB — RPR: RPR Ser Ql: NONREACTIVE

## 2023-03-25 LAB — GC/CHLAMYDIA PROBE AMP (~~LOC~~) NOT AT ARMC
Chlamydia: NEGATIVE
Comment: NEGATIVE
Comment: NORMAL
Neisseria Gonorrhea: NEGATIVE

## 2023-07-10 ENCOUNTER — Telehealth: Payer: Self-pay

## 2023-07-10 NOTE — Telephone Encounter (Signed)
 Thank you for sending this Katie Roberson! Hopefully she will connecting with our clinic again soon and can work out her schedule to make an appointment during the daytime. Mylinda Asa
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
## 2023-07-11 ENCOUNTER — Other Ambulatory Visit: Payer: Self-pay

## 2023-07-11 ENCOUNTER — Emergency Department (HOSPITAL_COMMUNITY)
Admission: EM | Admit: 2023-07-11 | Discharge: 2023-07-11 | Disposition: A | Discharge status: Home / Self Care | Attending: Emergency Medicine | Admitting: Emergency Medicine

## 2023-07-11 ENCOUNTER — Encounter (HOSPITAL_COMMUNITY): Payer: Self-pay

## 2023-07-11 DIAGNOSIS — Z202 Contact with and (suspected) exposure to infections with a predominantly sexual mode of transmission: Secondary | ICD-10-CM | POA: Diagnosis present

## 2023-07-11 LAB — URINALYSIS, ROUTINE W REFLEX MICROSCOPIC
Bacteria, UA: NONE SEEN
Bilirubin Urine: NEGATIVE
Glucose, UA: NEGATIVE mg/dL
Hgb urine dipstick: NEGATIVE
Ketones, ur: 5 mg/dL — AB
Leukocytes,Ua: NEGATIVE
Nitrite: NEGATIVE
Protein, ur: 30 mg/dL — AB
Specific Gravity, Urine: 1.029 (ref 1.005–1.030)
pH: 5 (ref 5.0–8.0)

## 2023-07-11 LAB — HIV ANTIBODY (ROUTINE TESTING W REFLEX): HIV Screen 4th Generation wRfx: NONREACTIVE

## 2023-07-11 LAB — PREGNANCY, URINE: Preg Test, Ur: NEGATIVE

## 2023-07-11 LAB — GC/CHLAMYDIA PROBE AMP (~~LOC~~) NOT AT ARMC
Chlamydia: NEGATIVE
Comment: NEGATIVE
Comment: NORMAL
Neisseria Gonorrhea: NEGATIVE

## 2023-07-11 LAB — RPR: RPR Ser Ql: NONREACTIVE

## 2023-07-11 MED ORDER — EMTRICITABINE-TENOFOVIR DF 200-300 MG PO TABS: 1.0000 | ORAL_TABLET | Freq: Every day | ORAL | 0 refills | Status: AC

## 2023-07-11 MED ORDER — DOLUTEGRAVIR SODIUM 50 MG PO TABS: 50.0000 mg | ORAL_TABLET | Freq: Every day | ORAL | 0 refills | Status: AC
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
## 2023-07-11 NOTE — ED Triage Notes (Signed)
 Pov from home. Cc of STD check. Said that condom popped yesterday. Unsure of status of partner. Denies s/s.

## 2023-07-12 ENCOUNTER — Ambulatory Visit: Admitting: Licensed Practical Nurse

## 2023-07-12 ENCOUNTER — Encounter: Payer: Self-pay | Admitting: Licensed Practical Nurse

## 2023-07-16 ENCOUNTER — Ambulatory Visit (INDEPENDENT_AMBULATORY_CARE_PROVIDER_SITE_OTHER): Admitting: Obstetrics & Gynecology

## 2023-07-16 VITALS — BP 107/67 | HR 77 | Ht 68.0 in | Wt 327.5 lb

## 2023-07-16 DIAGNOSIS — E282 Polycystic ovarian syndrome: Secondary | ICD-10-CM

## 2023-07-16 DIAGNOSIS — N898 Other specified noninflammatory disorders of vagina: Secondary | ICD-10-CM | POA: Diagnosis not present

## 2023-07-16 DIAGNOSIS — N915 Oligomenorrhea, unspecified: Secondary | ICD-10-CM

## 2023-07-16 DIAGNOSIS — N912 Amenorrhea, unspecified: Secondary | ICD-10-CM

## 2023-07-16 DIAGNOSIS — Z3202 Encounter for pregnancy test, result negative: Secondary | ICD-10-CM | POA: Diagnosis not present

## 2023-07-16 LAB — POCT URINE PREGNANCY: Preg Test, Ur: NEGATIVE

## 2023-07-16 MED ORDER — MEDROXYPROGESTERONE ACETATE 10 MG PO TABS: 10.0000 mg | ORAL_TABLET | Freq: Every day | ORAL | 12 refills | Status: AC

## 2023-07-16 NOTE — Progress Notes (Signed)
    GYNECOLOGY PROGRESS NOTE  Subjective:    Patient ID: Katie Roberson, female    DOB: 08-14-1990, 33 y.o.   MRN: [id number]  HPI  Patient is a 33 y.o. G63P1021 ( 1 yo daughter) who presents for vaginal dryness. She reports that she had vaginal dryness since 2020. She then had a baby in 2023 and the vaginal dryness has continued. She did not breastfeed. She thinks this is abnormal because of her age. She only has dryness when she has intercourse, uses lubricates that does not help. Patient's last menstrual period was 05/29/2023 (approximate). She has a history of PCOS. She reports that during the last 12 months she had only 3-4 periods.   She had been abstinent for some time but last week started having sex again. She is using condoms for contraception. In her chart I noted negative STI testing last week.   The following portions of the patient's history were reviewed and updated as appropriate: allergies, current medications, past family history, past medical history, past social history, past surgical history, and problem list.  Review of Systems Pertinent items are noted in HPI.   Objective:   Blood pressure 107/67, pulse 77, height 5\' 8"  (1.727 m), weight (!) 327 lb 8 oz (148.6 kg), SpO2 (!) 16%, unknown if currently breastfeeding. Body mass index is 49.8 kg/m. Well nourished, well hydrated Black female, no apparent distress She is ambulating and conversing normally.    Assessment:   1. Vaginal dryness   2. PCOS (polycystic ovarian syndrome)   3.      Oligomenorrhea- we discussed the increased risk of uterine cancer in the future if she has less than 6 periods per year.  Plan:   She will come back for fasting FSH, LH, TSH, and Prolactin If labs are normal, suggested that partner take more time with foreplay. I will prescribe provera  10 mg to be taken in a cyclic fashion to help prevent future increased risk of uterine cancer. 3.   Rec fam med for preventative care,  especially with BMI of 49  Stephane Ee, MD [facility] [place]

## 2023-07-18 ENCOUNTER — Telehealth: Payer: Self-pay | Admitting: Obstetrics & Gynecology

## 2023-07-18 ENCOUNTER — Other Ambulatory Visit

## 2023-07-18 NOTE — Telephone Encounter (Signed)
 Reached out to pt to reschedule lab appt that was scheduled on 07/18/2023 at 10:00 for fasting labs.  Was able to reschedule the appt for 07/26/2023 at 11:00.

## 2023-07-26 ENCOUNTER — Other Ambulatory Visit

## 2023-07-26 ENCOUNTER — Telehealth: Payer: Self-pay | Admitting: Obstetrics & Gynecology

## 2023-07-26 NOTE — Telephone Encounter (Signed)
 Reached out to pt to reschedule lab appt that was scheduled on 07/26/2023 at 11:00.  Could not leave a message bc mailbox was full.

## 2023-07-29 ENCOUNTER — Encounter: Payer: Self-pay | Admitting: Obstetrics & Gynecology

## 2023-07-29 NOTE — Telephone Encounter (Signed)
 Reached out to pt (2x) to reschedule lab appt that was scheduled on 07/26/2023 at 11:00.  Could not leave a message bc mailbox was full.  Will send a MyChart letter to pt.

## 2023-07-30 ENCOUNTER — Other Ambulatory Visit: Payer: Self-pay | Admitting: Obstetrics & Gynecology

## 2023-07-30 ENCOUNTER — Other Ambulatory Visit

## 2023-07-30 ENCOUNTER — Ambulatory Visit

## 2023-07-30 VITALS — BP 121/81 | HR 67 | Ht 68.0 in | Wt 323.0 lb

## 2023-07-30 DIAGNOSIS — Z3202 Encounter for pregnancy test, result negative: Secondary | ICD-10-CM

## 2023-07-30 DIAGNOSIS — N912 Amenorrhea, unspecified: Secondary | ICD-10-CM

## 2023-07-30 DIAGNOSIS — Z3200 Encounter for pregnancy test, result unknown: Secondary | ICD-10-CM

## 2023-07-30 DIAGNOSIS — N915 Oligomenorrhea, unspecified: Secondary | ICD-10-CM

## 2023-07-30 LAB — POCT URINE PREGNANCY: Preg Test, Ur: NEGATIVE

## 2023-07-30 NOTE — Progress Notes (Addendum)
    NURSE VISIT NOTE  Subjective:    Patient ID: Katie Roberson, female    DOB: February 22, 1990, 33 y.o.   MRN: [id number]  HPI  Patient is a 33 y.o. G10P1021 female who presents for evaluation of amenorrhea. Has irregular period and has not taken a pregnancy test at [facility] here for labs and requested a test she is unsure of her lmp as she has irregular periods states maybe April or May.    Objective:    BP 121/81   Pulse 67   Ht 5\' 8"  (1.727 m)   Wt (!) 323 lb (146.5 kg)   LMP  (LMP Unknown)   Breastfeeding No   BMI 49.11 kg/m   Lab Review  Results for orders placed or performed in visit on 07/30/23  POCT urine pregnancy  Result Value Ref Range   Preg Test, Ur Negative Negative    Assessment:   1. Possible pregnancy, not confirmed     Plan:  Pt will take a pregnancy test a home in about a week and let us  know if she needs anything else.    Melissa M Swanson, CNM

## 2023-07-31 LAB — PROLACTIN: Prolactin: 10.8 ng/mL (ref 4.8–33.4)

## 2023-07-31 LAB — TSH+FREE T4
Free T4: 1.23 ng/dL (ref 0.82–1.77)
TSH: 1.11 u[IU]/mL (ref 0.450–4.500)

## 2023-07-31 LAB — FSH/LH
FSH: 1.5 m[IU]/mL
LH: 5.1 m[IU]/mL

## 2023-08-05 ENCOUNTER — Encounter: Payer: Self-pay | Admitting: Obstetrics & Gynecology

## 2023-09-17 ENCOUNTER — Telehealth: Payer: Self-pay

## 2023-09-17 NOTE — Telephone Encounter (Signed)
 Patient states she never picked up her medroxyPROGESTERone  (PROVERA ) 10 MG tablet . Pharmacy advised her they did not receive the rx. Advised per our records:  Will contact pharmacy to verify.

## 2023-10-27 ENCOUNTER — Emergency Department (HOSPITAL_COMMUNITY)
Admission: EM | Admit: 2023-10-27 | Discharge: 2023-10-27 | Disposition: A | Discharge status: Home / Self Care | Attending: Emergency Medicine | Admitting: Emergency Medicine

## 2023-10-27 ENCOUNTER — Encounter (HOSPITAL_COMMUNITY): Payer: Self-pay | Admitting: Emergency Medicine

## 2023-10-27 ENCOUNTER — Other Ambulatory Visit: Payer: Self-pay

## 2023-10-27 DIAGNOSIS — Z711 Person with feared health complaint in whom no diagnosis is made: Secondary | ICD-10-CM

## 2023-10-27 DIAGNOSIS — Z202 Contact with and (suspected) exposure to infections with a predominantly sexual mode of transmission: Secondary | ICD-10-CM | POA: Diagnosis not present

## 2023-10-27 DIAGNOSIS — N898 Other specified noninflammatory disorders of vagina: Secondary | ICD-10-CM | POA: Diagnosis present

## 2023-10-27 LAB — RESP PANEL BY RT-PCR (RSV, FLU A&B, COVID)  RVPGX2
Influenza A by PCR: NEGATIVE
Influenza B by PCR: NEGATIVE
Resp Syncytial Virus by PCR: NEGATIVE
SARS Coronavirus 2 by RT PCR: NEGATIVE

## 2023-10-27 LAB — URINALYSIS, ROUTINE W REFLEX MICROSCOPIC
Bilirubin Urine: NEGATIVE
Glucose, UA: NEGATIVE mg/dL
Hgb urine dipstick: NEGATIVE
Ketones, ur: NEGATIVE mg/dL
Leukocytes,Ua: NEGATIVE
Nitrite: NEGATIVE
Protein, ur: NEGATIVE mg/dL
Specific Gravity, Urine: 1.019 (ref 1.005–1.030)
pH: 5 (ref 5.0–8.0)

## 2023-10-27 LAB — WET PREP, GENITAL
Clue Cells Wet Prep HPF POC: NONE SEEN
Sperm: NONE SEEN
Trich, Wet Prep: NONE SEEN
WBC, Wet Prep HPF POC: <10 (ref <10)
Yeast Wet Prep HPF POC: NONE SEEN

## 2023-10-27 NOTE — Discharge Instructions (Signed)
 Your test for std's are pending.  Return if any problems.

## 2023-10-27 NOTE — ED Triage Notes (Signed)
 Pt here with c/o flu-like symptoms, diarrhea x 3 days and was reading online that it could be STD related and wants to be checked for STD. Pt states she is also having clear, sticky, vaginal discharge.

## 2023-10-27 NOTE — ED Provider Notes (Signed)
 [place] EMERGENCY DEPARTMENT AT [facility] Provider Note   CSN: [id number] Arrival date & time: 10/27/23  2108     Patient presents with: Flu-like symptoms and SEXUALLY TRANSMITTED DISEASE   Katie Roberson is a 33 y.o. female.   Patient complains of vaginal discharge.  Patient is concerned that she could have had an exposure to an STD.  Patient states she does not know if her partner has had any symptoms.  Patient denies any fever or chills.  Patient states that she has had some vaginal irritation and some unusual discharge.  Patient denies any fever or chills she denies any burning with urination.  The history is provided by the patient. No language interpreter was used.       Prior to Admission medications   Medication Sig Start Date End Date Taking? Authorizing Provider  acyclovir (ZOVIRAX) 400 MG tablet Take 400 mg by mouth 2 (two) times daily.    [provider]  dolutegravir  (TIVICAY ) 50 MG tablet Take 1 tablet (50 mg total) by mouth daily. 07/11/23   Midge Golas, MD  emtricitabine -tenofovir  (TRUVADA) 200-300 MG tablet Take 1 tablet by mouth daily. 07/11/23   Midge Golas, MD  EPINEPHrine  0.3 mg/0.3 mL IJ SOAJ injection Inject 0.3 mg into the muscle as needed for anaphylaxis. 11/12/20   Edelmiro Leash, MD  EQ ALL DAY ALLERGY RELIEF 10 MG tablet Take 10 mg by mouth daily. 09/17/22   [provider]  famotidine  (PEPCID ) 20 MG tablet Take 1 tablet (20 mg total) by mouth 2 (two) times daily. 11/18/21   Sung, Jade J, MD  fenofibrate (TRICOR) 48 MG tablet Take 48 mg by mouth daily. 03/30/22   [provider]  Lactic Ac-Citric Ac-Pot Bitart (PHEXXI ) 1.8-1-0.4 % GEL Place 1 applicator vaginally as needed (use prior to sexual intercourse as directed). Patient not taking: Reported on 09/25/2022 07/05/22   Janit Alm Agent, MD  medroxyPROGESTERone  (PROVERA ) 10 MG tablet Take 1 tablet (10 mg total) by mouth daily. Use for ten days 07/16/23    Dove, Myra C, MD  triamcinolone cream (KENALOG) 0.1 % Apply 1 Application topically as needed. 09/17/22   [provider]    Allergies: Kiwi extract, Grapefruit concentrate, Grapefruit extract, and Soap    Review of Systems  All other systems reviewed and are negative.   Updated Vital Signs BP 136/87   Pulse 72   Temp 98.4 F (36.9 C) (Oral)   Resp 18   Ht 5' 8 (1.727 m)   Wt (!) 147 kg   SpO2 100%   BMI 49.26 kg/m   Physical Exam Vitals and nursing note reviewed.  Constitutional:      Appearance: She is well-developed.  HENT:     Head: Normocephalic.  Cardiovascular:     Rate and Rhythm: Normal rate.  Pulmonary:     Effort: Pulmonary effort is normal.  Abdominal:     General: There is no distension.  Musculoskeletal:        General: Normal range of motion.     Cervical back: Normal range of motion.  Skin:    General: Skin is warm.  Neurological:     General: No focal deficit present.     Mental Status: She is alert and oriented to person, place, and time.     (all labs ordered are listed, but only abnormal results are displayed) Labs Reviewed  RESP PANEL BY RT-PCR (RSV, FLU A&B, COVID)  [id number]  WET PREP, GENITAL  URINALYSIS, ROUTINE W REFLEX MICROSCOPIC  HIV ANTIBODY (ROUTINE TESTING W REFLEX)  RPR  GC/CHLAMYDIA PROBE AMP ([place]) NOT AT [facility]    EKG: None  Radiology: No results found.   Procedures   Medications Ordered in the ED - No data to display                                  Medical Decision Making Patient request STD testing she has noted a vaginal discharge  Amount and/or Complexity of Data Reviewed Labs: ordered.    Details: Wet prep ordered reviewed and interpreted no findings UA is negative GC chlamydia HIV and RPR pending  Risk Risk Details: Patient counseled on safe sex.  Patient is advised her results are pending.  Patient is discharged in stable condition        Final diagnoses:  Concern about STD in  female without diagnosis  Vaginal discharge    ED Discharge Orders     None       An After Visit Summary was printed and given to the patient.    Flint Sonny MARLA DEVONNA 10/27/23 2248    Cleotilde Rogue, MD 11/01/23 [phone]

## 2023-10-28 LAB — RPR: RPR Ser Ql: NONREACTIVE

## 2023-10-28 LAB — HIV ANTIBODY (ROUTINE TESTING W REFLEX): HIV Screen 4th Generation wRfx: NONREACTIVE

## 2023-10-29 LAB — GC/CHLAMYDIA PROBE AMP (~~LOC~~) NOT AT ARMC
Chlamydia: NEGATIVE
Comment: NEGATIVE
Comment: NORMAL
Neisseria Gonorrhea: NEGATIVE

## 2023-11-14 ENCOUNTER — Encounter: Admitting: Advanced Practice Midwife

## 2024-02-04 ENCOUNTER — Ambulatory Visit: Admitting: Women's Health

## 2024-02-04 ENCOUNTER — Other Ambulatory Visit (HOSPITAL_COMMUNITY)
Admission: RE | Admit: 2024-02-04 | Discharge: 2024-02-04 | Disposition: A | Discharge status: Home / Self Care | Source: Ambulatory Visit | Attending: Women's Health | Admitting: Women's Health

## 2024-02-04 ENCOUNTER — Encounter: Payer: Self-pay | Admitting: Women's Health

## 2024-02-04 VITALS — BP 124/82 | HR 86 | Ht 68.0 in | Wt 333.4 lb

## 2024-02-04 DIAGNOSIS — Z62810 Personal history of physical and sexual abuse in childhood: Secondary | ICD-10-CM | POA: Diagnosis not present

## 2024-02-04 DIAGNOSIS — F5231 Female orgasmic disorder: Secondary | ICD-10-CM | POA: Insufficient documentation

## 2024-02-04 DIAGNOSIS — Z113 Encounter for screening for infections with a predominantly sexual mode of transmission: Secondary | ICD-10-CM | POA: Diagnosis present

## 2024-02-04 DIAGNOSIS — N941 Unspecified dyspareunia: Secondary | ICD-10-CM | POA: Insufficient documentation

## 2024-02-04 DIAGNOSIS — N898 Other specified noninflammatory disorders of vagina: Secondary | ICD-10-CM | POA: Insufficient documentation
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
## 2024-02-06 LAB — CERVICOVAGINAL ANCILLARY ONLY
Bacterial Vaginitis (gardnerella): NEGATIVE
Candida Glabrata: NEGATIVE
Candida Vaginitis: NEGATIVE
Chlamydia: NEGATIVE
Comment: NEGATIVE
Comment: NEGATIVE
Comment: NEGATIVE
Comment: NEGATIVE
Comment: NEGATIVE
Comment: NORMAL
Neisseria Gonorrhea: NEGATIVE
Trichomonas: NEGATIVE

## 2024-02-10 ENCOUNTER — Ambulatory Visit: Payer: Self-pay | Admitting: Women's Health

## 2024-02-11 ENCOUNTER — Encounter: Admitting: Obstetrics & Gynecology

## 2024-02-14 ENCOUNTER — Encounter (HOSPITAL_COMMUNITY): Payer: Self-pay | Admitting: Emergency Medicine

## 2024-02-14 ENCOUNTER — Other Ambulatory Visit: Payer: Self-pay

## 2024-02-14 ENCOUNTER — Emergency Department (HOSPITAL_COMMUNITY)
Admission: EM | Admit: 2024-02-14 | Discharge: 2024-02-14 | Disposition: A | Discharge status: Home / Self Care | Attending: Emergency Medicine | Admitting: Emergency Medicine

## 2024-02-14 ENCOUNTER — Emergency Department (HOSPITAL_COMMUNITY)

## 2024-02-14 DIAGNOSIS — R0602 Shortness of breath: Secondary | ICD-10-CM | POA: Diagnosis present

## 2024-02-14 DIAGNOSIS — K219 Gastro-esophageal reflux disease without esophagitis: Secondary | ICD-10-CM | POA: Insufficient documentation

## 2024-02-14 DIAGNOSIS — Z87891 Personal history of nicotine dependence: Secondary | ICD-10-CM | POA: Insufficient documentation

## 2024-02-14 LAB — URINALYSIS, ROUTINE W REFLEX MICROSCOPIC
Bilirubin Urine: NEGATIVE
Glucose, UA: NEGATIVE mg/dL
Hgb urine dipstick: NEGATIVE
Ketones, ur: NEGATIVE mg/dL
Nitrite: NEGATIVE
Protein, ur: NEGATIVE mg/dL
Specific Gravity, Urine: 1.003 — ABNORMAL LOW (ref 1.005–1.030)
pH: 6 (ref 5.0–8.0)

## 2024-02-14 LAB — TROPONIN T, HIGH SENSITIVITY: Troponin T High Sensitivity: <15 ng/L (ref 0–19)

## 2024-02-14 LAB — BASIC METABOLIC PANEL WITH GFR
Anion gap: 11 (ref 5–15)
BUN: 7 mg/dL (ref 6–20)
CO2: 23 mmol/L (ref 22–32)
Calcium: 9 mg/dL (ref 8.9–10.3)
Chloride: 103 mmol/L (ref 98–111)
Creatinine, Ser: 0.83 mg/dL (ref 0.44–1.00)
GFR, Estimated: >60 mL/min
Glucose, Bld: 88 mg/dL (ref 70–99)
Potassium: 4 mmol/L (ref 3.5–5.1)
Sodium: 138 mmol/L (ref 135–145)

## 2024-02-14 LAB — CBC
HCT: 39.4 % (ref 36.0–46.0)
Hemoglobin: 13.4 g/dL (ref 12.0–15.0)
MCH: 30.5 pg (ref 26.0–34.0)
MCHC: 34 g/dL (ref 30.0–36.0)
MCV: 89.5 fL (ref 80.0–100.0)
Platelets: 251 K/uL (ref 150–400)
RBC: 4.4 MIL/uL (ref 3.87–5.11)
RDW: 13.2 % (ref 11.5–15.5)
WBC: 11.6 K/uL — ABNORMAL HIGH (ref 4.0–10.5)
nRBC: 0 % (ref 0.0–0.2)

## 2024-02-14 LAB — PRO BRAIN NATRIURETIC PEPTIDE: Pro Brain Natriuretic Peptide: <50 pg/mL

## 2024-02-14 LAB — PREGNANCY, URINE: Preg Test, Ur: NEGATIVE

## 2024-02-14 MED ORDER — ALUM & MAG HYDROXIDE-SIMETH 200-200-20 MG/5ML PO SUSP
30.0000 mL | Freq: Once | ORAL | Status: AC
  Administered 2024-02-14: 30 mL via ORAL
  Filled 2024-02-14: qty 30

## 2024-02-14 MED ORDER — SUCRALFATE 1 G PO TABS: 1.0000 g | ORAL_TABLET | Freq: Four times a day (QID) | ORAL | 0 refills | Status: AC | PRN

## 2024-02-14 MED ORDER — OMEPRAZOLE 20 MG PO CPDR: 20.0000 mg | DELAYED_RELEASE_CAPSULE | Freq: Every day | ORAL | 1 refills | Status: AC

## 2024-02-14 NOTE — ED Provider Notes (Signed)
 " [facility]ENCY DEPT [facility] Emergency Department Provider Note MRN:  [id number]  Arrival date & time: 02/14/2024     Chief Complaint   Shortness of breath History of Present Illness   Katie Roberson is a 33 y.o. year-old female with a history of PCOS presenting to the ED with chief complaint of shortness of breath.  Shortness of breath when laying flat over the past few days.  Also having a burning discomfort in the throat when laying flat.  Also having some frequent urination.  Denies cough or cold-like symptoms, no fever, no chest pain, no abdominal pain.  Review of Systems  A thorough review of systems was obtained and all systems are negative except as noted in the HPI and PMH.   Patient's Health History    Past Medical History:  Diagnosis Date   Abnormal vaginal bleeding 01/29/2013   Anxiety    Chicken pox    Chronic abdominal pain    Chronic pelvic pain in female    Dysmenorrhea    Gallstones    PCOS (polycystic ovarian syndrome)    Vaginal pain    chronic    Past Surgical History:  Procedure Laterality Date   CHOLECYSTECTOMY N/A 02/28/2018   Procedure: LAPAROSCOPIC CHOLECYSTECTOMY;  Surgeon: Mavis Anes, MD;  Location: [facility] ORS;  Service: General;  Laterality: N/A;   WISDOM TOOTH EXTRACTION     FOUR; age 47    Family History  Problem Relation Age of Onset   Healthy Mother    Healthy Father    Heart Problems Maternal Grandmother    Alzheimer's disease Maternal Grandfather    Diabetes Paternal Grandmother    Alzheimer's disease Paternal Grandfather    Healthy Half-Sister    Healthy Half-Sister     Social History   Socioeconomic History   Marital status: Single    Spouse name: Not on file   Number of children: 1   Years of education: 12   Highest education level: Not on file  Occupational History   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  Tobacco Use   Smoking status: Former    Types: Cigarettes   Smokeless tobacco: Never   Vaping Use   Vaping status: Former   Substances: Nicotine   Devices: quit 2023  Substance and Sexual Activity   Alcohol use: Yes    Comment: socially- not since pregnancy   Drug use: No   Sexual activity: Yes    Partners: Male  Other Topics Concern   Not on file  Social History Narrative   Not on file   Social Drivers of Health   Tobacco Use: Medium Risk (02/14/2024)   Patient History    Smoking Tobacco Use: Former    Smokeless Tobacco Use: Never    Passive Exposure: Not on Actuary Strain: Low Risk (09/25/2022)   Overall Financial Resource Strain (CARDIA)    Difficulty of Paying Living Expenses: Not hard at all  Food Insecurity: No Food Insecurity (09/25/2022)   Hunger Vital Sign    Worried About Running Out of Food in the Last Year: Never true    Ran Out of Food in the Last Year: Never true  Transportation Needs: No Transportation Needs (09/25/2022)   PRAPARE - Administrator, Civil Service (Medical): No    Lack of Transportation (Non-Medical): No  Physical Activity: Insufficiently Active (09/25/2022)   Exercise Vital Sign    Days of Exercise per Week: 1 day    Minutes of Exercise  per Session: 30 min  Stress: No Stress Concern Present (09/25/2022)   [employer] of Occupational Health - Occupational Stress Questionnaire    Feeling of Stress : Not at all  Social Connections: Unknown (09/25/2022)   Social Connection and Isolation Panel    Frequency of Communication with Friends and Family: More than three times a week    Frequency of Social Gatherings with Friends and Family: Once a week    Attends Religious Services: 1 to 4 times per year    Active Member of [employer] or Organizations: No    Attends Banker Meetings: Never    Marital Status: Not on file  Intimate Partner Violence: Not At Risk (09/25/2022)   Humiliation, Afraid, Rape, and Kick questionnaire    Fear of Current or Ex-Partner: No    Emotionally Abused: No    Physically  Abused: No    Sexually Abused: No  Depression (PHQ2-9): Not on file  Alcohol Screen: Not on file  Housing: Low Risk (09/25/2022)   Housing    Last Housing Risk Score: 0  Utilities: Not At Risk (09/25/2022)   AHC Utilities    Threatened with loss of utilities: No  Health Literacy: Adequate Health Literacy (09/25/2022)   B1300 Health Literacy    Frequency of need for help with medical instructions: Never     Physical Exam   Vitals:   02/14/24 0200 02/14/24 0230  BP: 117/75 109/70  Pulse: 74 68  Resp: 20 20  Temp:    SpO2: 100% 100%    CONSTITUTIONAL: Well-appearing, NAD NEURO/PSYCH:  Alert and oriented x 3, no focal deficits EYES:  eyes equal and reactive ENT/NECK:  no LAD, no JVD CARDIO: Regular rate, well-perfused, normal S1 and S2 PULM:  CTAB no wheezing or rhonchi GI/GU:  non-distended, non-tender MSK/SPINE:  No gross deformities, no edema SKIN:  no rash, atraumatic   *Additional and/or pertinent findings included in MDM below  Diagnostic and Interventional Summary    EKG Interpretation Date/Time:  Friday February 14 2024 02:37:37 EST Ventricular Rate:  68 PR Interval:  121 QRS Duration:  95 QT Interval:  389 QTC Calculation: 414 R Axis:   24  Text Interpretation: Sinus rhythm Confirmed by Theadore Sharper [phone]) on 02/14/2024 2:40:30 AM       Labs Reviewed  CBC - Abnormal; Notable for the following components:      Result Value   WBC 11.6 (*)    All other components within normal limits  URINALYSIS, ROUTINE W REFLEX MICROSCOPIC - Abnormal; Notable for the following components:   Color, Urine STRAW (*)    Specific Gravity, Urine 1.003 (*)    Leukocytes,Ua SMALL (*)    Bacteria, UA MANY (*)    All other components within normal limits  BASIC METABOLIC PANEL WITH GFR  PREGNANCY, URINE  PRO BRAIN NATRIURETIC PEPTIDE  TROPONIN T, HIGH SENSITIVITY    DG Chest Port 1 View  Final Result      Medications  alum & mag hydroxide-simeth (MAALOX/MYLANTA)  200-200-20 MG/5ML suspension 30 mL (30 mLs Oral Given 02/14/24 0232)     Procedures  /  Critical Care Procedures  ED Course and Medical Decision Making  Initial Impression and Ddx New onset orthopnea could suggest CHF however no lower extremity edema, not having any chest pain or shortness of breath when sitting up.  Reassuring vital signs.  With the additional symptom of burning in the throat when laying flat, GERD is more likely.  Also having frequent  urination, question UTI, diabetes.  Past medical/surgical history that increases complexity of ED encounter: None  Interpretation of Diagnostics I personally reviewed the EKG and my interpretation is as follows: Sinus rhythm, nonspecific findings  No significant blood count or electrolyte disturbance.  Troponin negative.  Urinalysis unremarkable  Patient Reassessment and Ultimate Disposition/Management     Patient feeling better, normal vitals, well-appearing, reassuring workup, discharged with return precautions.  Patient management required discussion with the following services or consulting groups:  None  Complexity of Problems Addressed Acute illness or injury that poses threat of life of bodily function  Additional Data Reviewed and Analyzed Further history obtained from: Prior labs/imaging results  Additional Factors Impacting ED Encounter Risk Prescriptions  Ozell HERO. Theadore, MD [facility] Emergency Medicine [facility] [email or web address]  Final Clinical Impressions(s) / ED Diagnoses     ICD-10-CM   1. Gastroesophageal reflux disease without esophagitis  K21.9       ED Discharge Orders          Ordered    omeprazole  (PRILOSEC) 20 MG capsule  Daily        02/14/24 0449    sucralfate  (CARAFATE ) 1 g tablet  4 times daily PRN        02/14/24 0449             Discharge Instructions Discussed with and Provided to Patient:     Discharge Instructions      You were evaluated in the  Emergency Department and after careful evaluation, we did not find any emergent condition requiring admission or further testing in the hospital.  Your exam/testing today is overall reassuring.  Symptoms may be due to acid reflux.  Recommend use of the omeprazole  daily to prevent pain.  Use the Carafate  as needed for more immediate relief as we discussed.  Follow-up with your primary care doctor.  Please return to the Emergency Department if you experience any worsening of your condition.   Thank you for allowing us  to be a part of your care.       Theadore Ozell HERO, MD 02/14/24 [phone]  "

## 2024-02-14 NOTE — Discharge Instructions (Signed)
 You were evaluated in the Emergency Department and after careful evaluation, we did not find any emergent condition requiring admission or further testing in the hospital.  Your exam/testing today is overall reassuring.  Symptoms may be due to acid reflux.  Recommend use of the omeprazole  daily to prevent pain.  Use the Carafate  as needed for more immediate relief as we discussed.  Follow-up with your primary care doctor.  Please return to the Emergency Department if you experience any worsening of your condition.   Thank you for allowing us  to be a part of your care.

## 2024-02-14 NOTE — ED Triage Notes (Addendum)
 Pt here with c/o SOB and a feeling of tightness in throat, urinary frequency, and heartburn over the last couple of days. Pt states she would also like a pregnancy test while she is here. States her periods are irregular and doesn't think she had one the month of December. Pt would also like an HIV test.

## 2024-02-19 ENCOUNTER — Other Ambulatory Visit: Payer: Self-pay | Admitting: Women's Health

## 2024-02-19 ENCOUNTER — Encounter: Payer: Self-pay | Admitting: Women's Health

## 2024-02-19 DIAGNOSIS — Z8742 Personal history of other diseases of the female genital tract: Secondary | ICD-10-CM | POA: Insufficient documentation

## 2024-02-27 ENCOUNTER — Encounter: Payer: Self-pay | Admitting: Women's Health

## 2024-02-27 ENCOUNTER — Ambulatory Visit

## 2024-02-27 ENCOUNTER — Ambulatory Visit: Admitting: Women's Health

## 2024-02-27 VITALS — BP 119/82 | HR 62 | Ht 68.0 in | Wt 333.0 lb

## 2024-02-27 DIAGNOSIS — F5231 Female orgasmic disorder: Secondary | ICD-10-CM

## 2024-02-27 DIAGNOSIS — N941 Unspecified dyspareunia: Secondary | ICD-10-CM

## 2024-02-27 DIAGNOSIS — N838 Other noninflammatory disorders of ovary, fallopian tube and broad ligament: Secondary | ICD-10-CM | POA: Diagnosis not present

## 2024-02-27 DIAGNOSIS — Z30017 Encounter for initial prescription of implantable subdermal contraceptive: Secondary | ICD-10-CM | POA: Diagnosis not present

## 2024-02-27 DIAGNOSIS — Z62810 Personal history of physical and sexual abuse in childhood: Secondary | ICD-10-CM

## 2024-02-27 DIAGNOSIS — E282 Polycystic ovarian syndrome: Secondary | ICD-10-CM
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
## 2024-02-27 NOTE — Patient Instructions (Signed)
 Keep the area clean and dry.  You can remove the big bandage in 24 hours, and the small steri-strip bandage in 3-5 days.  A back up method, such as condoms, should be used for two weeks. You may have irregular vaginal bleeding for the first 6 months after the Nexplanon  is placed, then the bleeding usually lightens and it is possible that you may not have any periods.  If you have any concerns, please give us  a call.    Etonogestrel  Implant What is this medication? ETONOGESTREL  (et oh noe JES trel) prevents ovulation and pregnancy. It belongs to a group of medications called contraceptives. This medication is a progestin hormone. This medicine may be used for other purposes; ask your health care provider or pharmacist if you have questions. COMMON BRAND NAME(S): Implanon , Nexplanon  What should I tell my care team before I take this medication? They need to know if you have any of these conditions: Abnormal vaginal bleeding Blood clots Blood vessel disease Breast, cervical, endometrial, ovarian, liver, or uterine cancer Diabetes Gallbladder disease Heart disease or recent heart attack High blood pressure High cholesterol or triglycerides Kidney disease Liver disease Migraine headaches Seizures Stroke Tobacco use An unusual or allergic reaction to etonogestrel , other medications, foods, dyes, or preservatives Pregnant or trying to get pregnant Breastfeeding How should I use this medication? This device is inserted just under the skin on the inner side of your upper arm by your care team. Talk to your care team about the use of this medication in children. Special care may be needed. Overdosage: If you think you have taken too much of this medicine contact a poison control center or emergency room at once. NOTE: This medicine is only for you. Do not share this medicine with others. What if I miss a dose? This does not apply. What may interact with this medication? Do not take this  medication with any of the following: Amprenavir Fosamprenavir This medication may also interact with the following: Acitretin Aprepitant Armodafinil Bexarotene Bosentan Carbamazepine Certain antivirals for HIV or hepatitis Certain medications for fungal infections, such as fluconazole , ketoconazole, itraconazole, or voriconazole Cyclosporine Felbamate Griseofulvin Lamotrigine Modafinil Oxcarbazepine Phenobarbital Phenytoin Primidone Rifabutin Rifampin Rifapentine St. John's wort Topiramate This list may not describe all possible interactions. Give your health care provider a list of all the medicines, herbs, non-prescription drugs, or dietary supplements you use. Also tell them if you smoke, drink alcohol, or use illegal drugs. Some items may interact with your medicine. What should I watch for while using this medication? Visit your care team for regular checks on your progress. Using this medication does not protect you or your partner against HIV or other sexually transmitted infections (STIs). You should be able to feel the implant by pressing your fingertips over the skin where it was inserted. Contact your care team if you cannot feel the implant, and use a non-hormonal birth control method (such as condoms) until your care team confirms that the implant is in place. Contact your care team if you think that the implant may have broken or become bent while in your arm. You will receive a user card from your care team after the implant is inserted. The card is a record of the location of the implant in your upper arm and when it should be removed. Keep this card with your health records. What side effects may I notice from receiving this medication? Side effects that you should report to your care team as soon as  possible: Allergic reactions--skin rash, itching, hives, swelling of the face, lips, tongue, or throat Blood clot--pain, swelling, or warmth in the leg, shortness of  breath, chest pain Gallbladder problems--severe stomach pain, nausea, vomiting, fever Increase in blood pressure Liver injury--right upper belly pain, loss of appetite, nausea, light-colored stool, dark yellow or brown urine, yellowing skin or eyes, unusual weakness or fatigue New or worsening migraines or headaches Pain, redness, or irritation at injection site Stroke--sudden numbness or weakness of the face, arm, or leg, trouble speaking, confusion, trouble walking, loss of balance or coordination, dizziness, severe headache, change in vision Unusual vaginal discharge, itching, or odor Worsening mood, feelings of depression Side effects that usually do not require medical attention (report to your care team if they continue or are bothersome): Breast pain or tenderness Dark patches of skin on the face or other sun-exposed areas Irregular menstrual cycles or spotting Nausea Weight gain This list may not describe all possible side effects. Call your doctor for medical advice about side effects. You may report side effects to FDA at 1-800-FDA-1088. Where should I keep my medication? This medication is given in a hospital or clinic and will not be stored at home. NOTE: This sheet is a summary. It may not cover all possible information. If you have questions about this medicine, talk to your doctor, pharmacist, or health care provider.  2024 Elsevier/Gold Standard (2021-09-12 00:00:00)

## 2024-02-27 NOTE — Progress Notes (Signed)
 PELVIC US  TA/TV: homogeneous anteverted uterus,WNL,EEC 10 mm,bilateral enlarged ovaries with multiple peripheral follicles,kissing ovaries sign,left ovary appears mobile,unable to slide right ovary,no free fluid,bilateral adnexal discomfort during ultrasound  Chaperone Tish

## 2024-03-06 MED ORDER — ETONOGESTREL 68 MG ~~LOC~~ IMPL
68.0000 mg | DRUG_IMPLANT | Freq: Once | SUBCUTANEOUS | Status: AC
  Administered 2024-02-27: 68 mg via SUBCUTANEOUS

## 2024-03-06 NOTE — Addendum Note (Signed)
 Addended by: ILEAN RUTHERFORD HERO on: 03/06/2024 11:21 AM   Modules accepted: Orders
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
## 2024-03-13 ENCOUNTER — Ambulatory Visit: Attending: Women's Health | Admitting: Physical Therapy

## 2024-03-13 ENCOUNTER — Other Ambulatory Visit: Payer: Self-pay

## 2024-03-13 ENCOUNTER — Encounter: Payer: Self-pay | Admitting: Physical Therapy

## 2024-03-13 DIAGNOSIS — R252 Cramp and spasm: Secondary | ICD-10-CM | POA: Insufficient documentation

## 2024-03-13 DIAGNOSIS — F5231 Female orgasmic disorder: Secondary | ICD-10-CM | POA: Insufficient documentation

## 2024-03-13 DIAGNOSIS — Z62810 Personal history of physical and sexual abuse in childhood: Secondary | ICD-10-CM | POA: Insufficient documentation

## 2024-03-13 DIAGNOSIS — N941 Unspecified dyspareunia: Secondary | ICD-10-CM | POA: Insufficient documentation
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
## 2024-03-17 ENCOUNTER — Encounter (HOSPITAL_COMMUNITY): Payer: Self-pay | Admitting: *Deleted

## 2024-03-17 ENCOUNTER — Telehealth: Payer: Self-pay | Admitting: Physical Therapy

## 2024-03-17 ENCOUNTER — Emergency Department (HOSPITAL_COMMUNITY)
Admission: EM | Admit: 2024-03-17 | Discharge: 2024-03-17 | Disposition: A | Discharge status: Home / Self Care | Attending: Emergency Medicine | Admitting: Emergency Medicine

## 2024-03-17 ENCOUNTER — Ambulatory Visit: Admitting: Physical Therapy

## 2024-03-17 ENCOUNTER — Other Ambulatory Visit: Payer: Self-pay

## 2024-03-17 DIAGNOSIS — Z7721 Contact with and (suspected) exposure to potentially hazardous body fluids: Secondary | ICD-10-CM | POA: Diagnosis present

## 2024-03-17 NOTE — ED Notes (Signed)
 Pt/family received d/c paperwork at this time. After going over the paperwork any questions, comments, or concerns were answered to the best of this nurse's knowledge. The pt/family verbally acknowledged the teachings/instructions.

## 2024-03-17 NOTE — ED Provider Notes (Signed)
 " [place] [facility] AT [facility] Provider Note   CSN: [id number] Arrival date & time: 03/17/24  1251     Patient presents with: No chief complaint on file.   Katie Roberson is a 34 y.o. female.   HPI     Katie Roberson is a 34 y.o. female who presents to the Emergency Department here requesting evaluation for possible blood exposure.  She states 4 days ago she had a needlestick to her finger to have her blood checked before giving plasma.  Yesterday, she was in a store restroom and touch the toilet seat and noticed there was blood on her hand.  She denies any symptoms.  Here requesting prophylactic treatment.  Prior to Admission medications  Medication Sig Start Date End Date Taking? Authorizing Provider  acyclovir (ZOVIRAX) 400 MG tablet Take 400 mg by mouth 2 (two) times daily.    [provider]  dolutegravir  (TIVICAY ) 50 MG tablet Take 1 tablet (50 mg total) by mouth daily. Patient not taking: Reported on 02/04/2024 07/11/23   Midge Golas, MD  emtricitabine -tenofovir  (TRUVADA) 200-300 MG tablet Take 1 tablet by mouth daily. Patient not taking: Reported on 02/04/2024 07/11/23   Midge Golas, MD  EPINEPHrine  0.3 mg/0.3 mL IJ SOAJ injection Inject 0.3 mg into the muscle as needed for anaphylaxis. Patient not taking: Reported on 02/04/2024 11/12/20   Edelmiro Leash, MD  EQ ALL DAY ALLERGY RELIEF 10 MG tablet Take 10 mg by mouth daily. Patient not taking: Reported on 02/04/2024 09/17/22   [provider]  famotidine  (PEPCID ) 20 MG tablet Take 1 tablet (20 mg total) by mouth 2 (two) times daily. 11/18/21   Sung, Jade J, MD  fenofibrate (TRICOR) 48 MG tablet Take 48 mg by mouth daily. 03/30/22   [provider]  Lactic Ac-Citric Ac-Pot Bitart (PHEXXI ) 1.8-1-0.4 % GEL Place 1 applicator vaginally as needed (use prior to sexual intercourse as directed). Patient not taking: Reported on 02/04/2024 07/05/22   Janit Alm Agent, MD  medroxyPROGESTERone  (PROVERA ) 10 MG tablet Take 1 tablet (10 mg total) by mouth daily. Use for ten days Patient not taking: Reported on 02/04/2024 07/16/23   Starla Harland BROCKS, MD  omeprazole  (PRILOSEC) 20 MG capsule Take 1 capsule (20 mg total) by mouth daily. 02/14/24   Theadore Ozell HERO, MD  sucralfate  (CARAFATE ) 1 g tablet Take 1 tablet (1 g total) by mouth 4 (four) times daily as needed. 02/14/24   Theadore Ozell HERO, MD  triamcinolone cream (KENALOG) 0.1 % Apply 1 Application topically as needed. Patient not taking: Reported on 02/04/2024 09/17/22   [provider]    Allergies: Kiwi extract, Grapefruit concentrate, Grapefruit extract, and Soap    Review of Systems  All other systems reviewed and are negative.   Updated Vital Signs BP 124/73   Pulse 71   Temp (!) 97.4 F (36.3 C)   Resp 18   Ht 5' 8 (1.727 m)   Wt (!) 149.7 kg   LMP 02/14/2024   SpO2 100%   BMI 50.18 kg/m   Physical Exam Vitals and nursing note reviewed.  Constitutional:      Appearance: Normal appearance.  Cardiovascular:     Rate and Rhythm: Normal rate.     Pulses: Normal pulses.  Pulmonary:     Effort: Pulmonary effort is normal.  Musculoskeletal:        General: Normal range of motion.  Skin:    General: Skin is warm.  Capillary Refill: Capillary refill takes less than 2 seconds.     Findings: No erythema.     Comments: Pinpoint puncture to the distal tip of the right ring finger. No surrounding erythema or edema.  Neurological:     General: No focal deficit present.     Mental Status: She is alert.     Sensory: No sensory deficit.     Motor: No weakness.     (all labs ordered are listed, but only abnormal results are displayed) Labs Reviewed - No data to display  EKG: None  Radiology: No results found.   Procedures   Medications Ordered in the ED - No data to display                                  Medical Decision Making  Patient here for concern for  possible blood exposure.  She had a needlestick to her finger 4 days ago for testing prior to giving plasma.  She touched a bloody surface yesterday with the same hand.  She denies any symptoms. She has washed her hands immediately after the incident and multiple times since then.   On exam, patient is well-appearing.  No significant past medical history.  There is a small pinpoint puncture wound to the distal tip of the right ring finger.  Likely low risk blood exposure, but will consult with infectious disease for further recommendation.  Amount and/or Complexity of Data Reviewed Discussion of management or test interpretation with external provider(s):   Discussed findings with Dr. Luiz with infectious disease.  Patient likely low risk for blood exposure given that needlestick was 4 days prior.  Patient reassured.  Appears appropriate for discharge home.        Final diagnoses:  Exposure to blood    ED Discharge Orders     None          Herlinda Milling, PA-C 03/20/24 1414    Towana Ozell BROCKS, MD 03/20/24 1732  "

## 2024-03-17 NOTE — Discharge Instructions (Signed)
Return to ER if needed.  °

## 2024-03-17 NOTE — ED Triage Notes (Signed)
 Pt touched something that had blood on it and pt had a cut to her finger.  Pt came here due to blood exposure.

## 2024-03-17 NOTE — Telephone Encounter (Signed)
 Spoke with patient  re no show for PT appt this afternoon. Patient was at an appt and will call us  at [phone] and reschedule
